# Patient Record
Sex: Female | Born: 1995 | Race: White | Hispanic: No | Marital: Single | Smoking: Current every day smoker
Health system: Southern US, Community
[De-identification: ages and names within clinical notes are randomized; demographics above are authoritative.]

## PROBLEM LIST (undated history)

## (undated) ENCOUNTER — Emergency Department (HOSPITAL_COMMUNITY): Admission: EM | Source: Home / Self Care

## (undated) ENCOUNTER — Inpatient Hospital Stay (HOSPITAL_COMMUNITY): Payer: Self-pay

## (undated) DIAGNOSIS — Z141 Cystic fibrosis carrier: Secondary | ICD-10-CM

## (undated) DIAGNOSIS — L309 Dermatitis, unspecified: Secondary | ICD-10-CM

## (undated) DIAGNOSIS — N390 Urinary tract infection, site not specified: Secondary | ICD-10-CM

## (undated) DIAGNOSIS — N883 Incompetence of cervix uteri: Secondary | ICD-10-CM

## (undated) DIAGNOSIS — F329 Major depressive disorder, single episode, unspecified: Secondary | ICD-10-CM

## (undated) DIAGNOSIS — F32A Depression, unspecified: Secondary | ICD-10-CM

## (undated) DIAGNOSIS — F1910 Other psychoactive substance abuse, uncomplicated: Secondary | ICD-10-CM

## (undated) HISTORY — PX: WISDOM TOOTH EXTRACTION: SHX21

## (undated) HISTORY — PX: PLACEMENT OF BREAST IMPLANTS: SHX6334

## (undated) HISTORY — DX: Urinary tract infection, site not specified: N39.0

## (undated) HISTORY — DX: Dermatitis, unspecified: L30.9

## (undated) HISTORY — DX: Incompetence of cervix uteri: N88.3

## (undated) HISTORY — DX: Cystic fibrosis carrier: Z14.1

## (undated) HISTORY — PX: NO PAST SURGERIES: SHX2092

---
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
## 2013-10-09 ENCOUNTER — Emergency Department (HOSPITAL_COMMUNITY)
Admission: EM | Admit: 2013-10-09 | Discharge: 2013-10-09 | Disposition: A | Discharge status: Home / Self Care | Payer: Self-pay | Attending: Emergency Medicine | Admitting: Emergency Medicine

## 2013-10-09 ENCOUNTER — Emergency Department (HOSPITAL_COMMUNITY): Payer: Self-pay

## 2013-10-09 ENCOUNTER — Encounter (HOSPITAL_COMMUNITY): Payer: Self-pay | Admitting: Emergency Medicine

## 2013-10-09 DIAGNOSIS — S60212A Contusion of left wrist, initial encounter: Secondary | ICD-10-CM

## 2013-10-09 DIAGNOSIS — Z87891 Personal history of nicotine dependence: Secondary | ICD-10-CM | POA: Insufficient documentation

## 2013-10-09 DIAGNOSIS — X500XXA Overexertion from strenuous movement or load, initial encounter: Secondary | ICD-10-CM | POA: Insufficient documentation

## 2013-10-09 DIAGNOSIS — Z872 Personal history of diseases of the skin and subcutaneous tissue: Secondary | ICD-10-CM | POA: Insufficient documentation

## 2013-10-09 DIAGNOSIS — Z8742 Personal history of other diseases of the female genital tract: Secondary | ICD-10-CM | POA: Insufficient documentation

## 2013-10-09 DIAGNOSIS — Z8659 Personal history of other mental and behavioral disorders: Secondary | ICD-10-CM | POA: Insufficient documentation

## 2013-10-09 DIAGNOSIS — S60219A Contusion of unspecified wrist, initial encounter: Secondary | ICD-10-CM | POA: Insufficient documentation

## 2013-10-09 DIAGNOSIS — Y9323 Activity, snow (alpine) (downhill) skiing, snow boarding, sledding, tobogganing and snow tubing: Secondary | ICD-10-CM | POA: Insufficient documentation

## 2013-10-09 DIAGNOSIS — Y9289 Other specified places as the place of occurrence of the external cause: Secondary | ICD-10-CM | POA: Insufficient documentation

## 2013-10-09 MED ORDER — TRAMADOL HCL 50 MG PO TABS: 50.0000 mg | ORAL_TABLET | Freq: Four times a day (QID) | ORAL | Status: DC | PRN

## 2013-10-09 NOTE — ED Notes (Signed)
Pt reports falling yesterday and having pain to left wrist and hand.

## 2013-10-09 NOTE — ED Provider Notes (Signed)
CSN: [id number]     Arrival date & time 10/09/13  1008 History  This chart was scribed for non-physician practitioner working with Ward GivensIva L Knapp, MD by Ashley JacobsBrittany Andrews, ED scribe. This patient was seen in room TR09C/TR09C and the patient's care was started at 10:29 AM.   First MD Initiated Contact with Patient 10/09/13 1026     Chief Complaint  Patient presents with  . Fall  . Wrist Pain   (Consider location/radiation/quality/duration/timing/severity/associated sxs/prior Treatment) Patient is a 18 y.o. female presenting with fall and wrist pain. The history is provided by the patient and medical records. No language interpreter was used.  Fall Pertinent negatives include no chest pain, no abdominal pain and no headaches.  Wrist Pain Pertinent negatives include no chest pain, no abdominal pain and no headaches.   HPI Comments: Nicole Morgan is a 18 y.o. female who presents to the Emergency Department complaining of fall that occurred yesterday. She went skiing yesterday and fell. After trying to get up pt reports hearing a "pop" and immediately felt pain medially. Pt is experiencing constant, moderate, left wrist and pain today that is worse with movement. She has a past medical hx of cystic fibrosis. She does not have any known allergies to medications. She denies any other pain. Nothing seems to resolve the pain.   Past Medical History  Diagnosis Date  . Depression     no meds  . Incompetent cervix   . Cystic fibrosis carrier   . Eczema    Past Surgical History  Procedure Laterality Date  . Wisdom tooth extraction    . Cervical cerclage  06/06/2012    Procedure: CERCLAGE CERVICAL;  Surgeon: Bing Plumehomas F Henley, MD;  Location: [facility];  Service: Gynecology;  Laterality: N/A;  . No past surgeries     Family History  Problem Relation Age of Onset  . Bipolar disorder Mother   . Mental illness Mother     schizophrenia  . Deep vein thrombosis Maternal Aunt   . Alcohol abuse Maternal  Grandmother   . Mental illness Maternal Grandmother    History  Substance Use Topics  . Smoking status: Former Smoker -- 0.25 packs/day for 1 years    Types: Cigarettes    Quit date: 02/22/2012  . Smokeless tobacco: Never Used  . Alcohol Use: No   OB History   Grav Para Term Preterm Abortions TAB SAB Ect Mult Living   1 1 1  0 0 0 0 0 0 1     Review of Systems  Cardiovascular: Negative for chest pain.  Gastrointestinal: Negative for abdominal pain.  Musculoskeletal: Positive for arthralgias and myalgias. Negative for back pain, gait problem and neck pain.  Neurological: Negative for headaches.  All other systems reviewed and are negative.    Allergies  Review of patient's allergies indicates no known allergies.  Home Medications   Current Outpatient Rx  Name  Route  Sig  Dispense  Refill  . calcium carbonate (TUMS - DOSED IN MG ELEMENTAL CALCIUM) 500 MG chewable tablet   Oral   Chew 2 tablets by mouth at bedtime as needed.         Marland Kitchen. ibuprofen (ADVIL,MOTRIN) 600 MG tablet   Oral   Take 1 tablet (600 mg total) by mouth every 6 (six) hours.   30 tablet   1    BP 106/73  Pulse 91  Temp(Src) 98.3 F (36.8 C) (Oral)  Resp 16  Ht 5\' 1"  (1.549 m)  Wt  126 lb (57.153 kg)  BMI 23.82 kg/m2  SpO2 100% Physical Exam  Nursing note and vitals reviewed. Constitutional: She is oriented to person, place, and time. She appears well-developed and well-nourished. No distress.  HENT:  Head: Normocephalic and atraumatic.  Eyes: EOM are normal. Pupils are equal, round, and reactive to light.  Neck: Normal range of motion. Neck supple. No tracheal deviation present.  Cardiovascular: Normal rate.   Pulmonary/Chest: Effort normal. No respiratory distress.  Abdominal: Soft. She exhibits no distension.  Musculoskeletal:       Left wrist: She exhibits tenderness and bony tenderness. She exhibits normal range of motion, no swelling, no crepitus and no deformity.       Left hand: She  exhibits tenderness and bony tenderness. She exhibits normal range of motion, normal capillary refill and no deformity. Normal sensation noted. Normal strength noted.       Hands: Neurological: She is alert and oriented to person, place, and time.  Skin: Skin is warm and dry.  Psychiatric: She has a normal mood and affect. Her behavior is normal.    ED Course  Procedures (including critical care time) DIAGNOSTIC STUDIES: Oxygen Saturation is 100% on room air, normal by my interpretation.    COORDINATION OF CARE:  10:40 AM Discussed course of care with pt which includes left wrist and hand x-ray. Pt understands and agrees.    Labs Review Labs Reviewed - No data to display Imaging Review Dg Wrist Complete Left  10/09/2013   CLINICAL DATA:  Lateral posterior wrist pain and swelling, patient fell yesterday  EXAM: LEFT WRIST - COMPLETE 3+ VIEW  COMPARISON:  None.  FINDINGS: There is no evidence of fracture or dislocation. There is no evidence of arthropathy or other focal bone abnormality. Soft tissues are unremarkable.  IMPRESSION: Negative.   Electronically Signed   By: Esperanza Heir M.D.   On: 10/09/2013 11:17   Dg Hand Complete Left  10/09/2013   CLINICAL DATA:  Larey Seat yesterday with lateral posterior wrist pain  EXAM: LEFT HAND - COMPLETE 3+ VIEW  COMPARISON:  None.  FINDINGS: There is no evidence of fracture or dislocation. There is no evidence of arthropathy or other focal bone abnormality. Soft tissues are unremarkable.  IMPRESSION: Negative.   Electronically Signed   By: Esperanza Heir M.D.   On: 10/09/2013 11:18    EKG Interpretation   None       MDM  Left wrist contusion  Patient here with left wrist pain after falling while skiing yesterday - no obvious sign of fracture on x-ray.  Placed in left wrist splint.  Will have the patient follow up with Dr. Amanda Pea if she is continuing to have pain in case of occult fracture.  Short course of pain medication.  I personally  performed the services described in this documentation, which was scribed in my presence. The recorded information has been reviewed and is accurate.      Izola Price Marisue Humble, PA-C 10/09/13 1125

## 2013-10-09 NOTE — ED Provider Notes (Signed)
Medical screening examination/treatment/procedure(s) were performed by non-physician practitioner and as supervising physician I was immediately available for consultation/collaboration.  EKG Interpretation   None       Devoria AlbeIva Osualdo Hansell, MD, Armando GangFACEP   Ward GivensIva L Shanesha Bednarz, MD 10/09/13 [phone]

## 2013-10-09 NOTE — Discharge Instructions (Signed)

## 2013-11-21 ENCOUNTER — Emergency Department (HOSPITAL_COMMUNITY)
Admission: EM | Admit: 2013-11-21 | Discharge: 2013-11-21 | Disposition: A | Discharge status: Home / Self Care | Payer: Medicaid Other | Attending: Emergency Medicine | Admitting: Emergency Medicine

## 2013-11-21 ENCOUNTER — Encounter (HOSPITAL_COMMUNITY): Payer: Self-pay | Admitting: Emergency Medicine

## 2013-11-21 DIAGNOSIS — R110 Nausea: Secondary | ICD-10-CM | POA: Insufficient documentation

## 2013-11-21 DIAGNOSIS — IMO0002 Reserved for concepts with insufficient information to code with codable children: Secondary | ICD-10-CM | POA: Insufficient documentation

## 2013-11-21 DIAGNOSIS — F3289 Other specified depressive episodes: Secondary | ICD-10-CM | POA: Insufficient documentation

## 2013-11-21 DIAGNOSIS — N949 Unspecified condition associated with female genital organs and menstrual cycle: Secondary | ICD-10-CM | POA: Insufficient documentation

## 2013-11-21 DIAGNOSIS — R102 Pelvic and perineal pain: Secondary | ICD-10-CM

## 2013-11-21 DIAGNOSIS — F329 Major depressive disorder, single episode, unspecified: Secondary | ICD-10-CM | POA: Insufficient documentation

## 2013-11-21 DIAGNOSIS — Z87891 Personal history of nicotine dependence: Secondary | ICD-10-CM | POA: Insufficient documentation

## 2013-11-21 DIAGNOSIS — Z9889 Other specified postprocedural states: Secondary | ICD-10-CM | POA: Insufficient documentation

## 2013-11-21 DIAGNOSIS — Z872 Personal history of diseases of the skin and subcutaneous tissue: Secondary | ICD-10-CM | POA: Insufficient documentation

## 2013-11-21 DIAGNOSIS — Z3202 Encounter for pregnancy test, result negative: Secondary | ICD-10-CM | POA: Insufficient documentation

## 2013-11-21 LAB — URINALYSIS, ROUTINE W REFLEX MICROSCOPIC
Bilirubin Urine: NEGATIVE
Glucose, UA: NEGATIVE mg/dL
Hgb urine dipstick: NEGATIVE
KETONES UR: NEGATIVE mg/dL
LEUKOCYTES UA: NEGATIVE
NITRITE: NEGATIVE
PH: 7.5 (ref 5.0–8.0)
PROTEIN: NEGATIVE mg/dL
Specific Gravity, Urine: 1.017 (ref 1.005–1.030)
Urobilinogen, UA: 0.2 mg/dL (ref 0.0–1.0)

## 2013-11-21 LAB — WET PREP, GENITAL
Clue Cells Wet Prep HPF POC: NONE SEEN
Trich, Wet Prep: NONE SEEN
Yeast Wet Prep HPF POC: NONE SEEN

## 2013-11-21 LAB — POC URINE PREG, ED: Preg Test, Ur: NEGATIVE

## 2013-11-21 MED ORDER — NAPROXEN 500 MG PO TABS: 500.0000 mg | ORAL_TABLET | Freq: Two times a day (BID) | ORAL | Status: DC

## 2013-11-21 NOTE — ED Notes (Signed)
Pt reporting pain to her IUD since yesterday. Denies vaginal bleeding or discharge. States lower abdominal pain cramping around where her IUD would be when she has sex. States just wants to make sure her IUD is okay. Pt is a x 4

## 2013-11-21 NOTE — Discharge Instructions (Signed)
Dyspareunia Dyspareunia is pain during sexual intercourse. It is most common in women, but it also happens in men.  CAUSES  Female The pain from this condition is usually felt when anything is put into the vagina, but any part of the genitals may cause pain during sex. Even sitting or wearing pants can cause pain. Sometimes, a cause cannot be found. Some causes of pain during intercourse are:  Infections of the skin around the vagina.  Vaginal infections, such as a yeast, bacterial, or viral infection.  Vaginismus. This is the inability to have anything put in the vagina even when the woman wants it to happen. There is an automatic muscle contraction and pain. The pain of the muscle contraction can be so severe that intercourse is impossible.  Allergic reaction from spermicides, semen, condoms, scented tampons, soaps, douches, and vaginal sprays.  A fluid-filled sac (cyst) on the Bartholin or Skene glands, located at the opening of the vagina.  Scar tissue in the vagina from a surgically enlarged opening (episiotomy) or tearing after delivering a baby.  Vaginal dryness. This is more common in menopause. The normal secretions of the vagina are decreased. Changes in estrogen levels and increased difficulty becoming aroused can cause painful sex. Vaginal dryness can also happen when taking birth control pills.  Thinning of the tissue (atrophy) of the vulva and vagina. This makes the area thinner, smaller, unable to stretch to accommodate a penis, and prone to infection and tearing.  Vulvar vestibulitis or vestibulodynia.This is a condition that causes pain involving the area around the entrance to the vagina.The most common cause in young women is birth control pills.Women with low estrogen levels (postmenopausal women) may also experience this.Other causes include allergic reactions, too many nerve endings, skin conditions, and pelvic muscles that cannot relax.  Vulvar dermatoses. This  includes skin conditions such as lichen sclerosus and lichen planus.  Lack of foreplay to lubricate the vagina. This can cause vaginal dryness.  Noncancerous tumors (fibroids) in the uterus.  Uterus lining tissue growing outside the uterus (endometriosis).  Pregnancy that starts in the fallopian tube (tubal pregnancy).  Pregnancy or breastfeeding your baby. This can cause vaginal dryness.  A tilting or prolapse of the uterus. Prolapse is when weak and stretched muscles around the uterus allow it to fall into the vagina.  Problems with the ovaries, cysts, or scar tissue. This may be worse with certain sexual positions.  Previous surgeries causing adhesions or scar tissue in the vagina or pelvis.  Bladder and intestinal problems.  Psychological problems (such as depression or anxiety). This may make pain worse.  Negative attitudes about sex, experiencing rape, sexual assault, and misinformation about sex. These issues are often related to some types of pain.  Previous pelvic infection, causing scar tissue in the pelvis and on the female organs.  Cyst or tumor on the ovary.  Cancer of the female organs.  Certain medicines.  Medical problems such as diabetes, arthritis, or thyroid disease. Female In men, there are many physical causes of sexual discomfort. Some causes of pain during intercourse are:  Infections of the prostate, bladder, or seminal vesicles. This can cause pain after ejaculation.  An inflamed bladder (interstitial cystitis). This may cause pain from ejaculation.  Gonorrheal infections. This may cause pain during ejaculation.  An inflamed urethra (urethritis) or inflamed prostate (prostatitis). This can make genital stimulation painful or uncomfortable.  Deformities of the penis, such as Peyronie's disease.  A tight foreskin.  Cancer of the female organs.    Psychological problems. This may make pain worse. DIAGNOSIS   Your caregiver will take a history and  have you describe where the pain is located (outside the vagina, in the vagina, in the pelvis). You may be asked when you experience pain, such as with penetration or with thrusting.  Following this, your caregiver will do a physical exam. Let your caregiver know if the exam is too painful.  During the final part of the female exam, your caregiver will feel your uterus and ovaries with one hand on the abdomen and one finger in your vagina. This is a pelvic exam.  Blood tests, a Pap test, cultures for infection, an ultrasound test, and X-rays may be done. You may need to see a specialist for female problems (gynecologist).  Your caregiver may do a CT scan, MRI, or laparoscopy. Laparoscopy is a procedure to look into the pelvis with a lighted tube, through a cut (incision) in the abdomen. TREATMENT  Your caregiver can help you determine the best course of treatment. Sometimes, more testing is done. Continue with the suggested testing until your caregiver feels sure about your diagnosis and how to treat it. Sometimes, it is difficult to find the reason for the pain. The search for the cause and treatment can be frustrating. Treatment often takes several weeks to a few months before you notice any improvement. You may also need to avoid sexual activity until symptoms improve.Continuing to have sex when it hurts can delay healing and actually make the problem worse. The treatment depends on the cause of the pain. Treatment may include:  Medicines such as antibiotics, vaginal or skin creams, hormones, or antidepressants.  Minor or major surgery.  Psychological counseling or group therapy.  Kegel exercises and vaginal dilators to help certain cases of vaginismus (spasms). Do this only if recommended by your caregiver.Kegel exercises can make some problems worse.  Applying lubrication as recommended by your caregiver if you have dryness.  Sex therapy for you and your sex partner. It is common for  the pain to continue after the reason for the pain has been treated. Some reasons for this include a conditioned response. This means the person having the pain becomes so familiar with the pain that the pain continues as a response, even though the cause is removed. Sex therapy can help with this problem. HOME CARE INSTRUCTIONS   Follow your caregiver's instructions about taking medicines, tests, counseling, and follow-up treatment.  Do not use scented tampons, douches, vaginal sprays, or soaps.  Use water-based lubricants for dryness. Oil lubricants can cause irritation.  Do not use spermicides or condoms that irritate you.  Openly discuss with your partner your sexual experience, your desires, foreplay, and different sexual positions for a more comfortable and enjoyable sexual relationship.  Join group sessions for therapy, if needed.  Practice safe sex at all times.  Empty your bladder before having intercourse.  Try different positions during sexual intercourse.  Take over-the-counter pain medicine recommended by your caregiver before having sexual intercourse.  Do not wear pantyhose. Knee-high and thigh-high hose are okay.  Avoid scrubbing your vulva with a washcloth. Wash the area gently and pat dry with a towel. SEEK MEDICAL CARE IF:   You develop vaginal bleeding after sexual intercourse.  You develop a lump at the opening of your vagina, even if it is not painful.  You have abnormal vaginal discharge.  You have vaginal dryness.  You have itching or irritation of the vulva or vagina.  You   develop a rash or reaction to your medicine. SEEK IMMEDIATE MEDICAL CARE IF:   You develop severe abdominal pain during or shortly after sexual intercourse. You could have a ruptured ovarian cyst or ruptured tubal pregnancy.  You have a fever.  You have painful or bloody urination.  You have painful sexual intercourse, and you never had it before.  You pass out after having  sexual intercourse. Document Released: 09/29/2007 Document Revised: 12/02/2011 Document Reviewed: 12/10/2010 [employer] Patient Information 2014 [employer].  

## 2013-11-21 NOTE — ED Provider Notes (Signed)
CSN: [id number]     Arrival date & time 11/21/13  1432 History   First MD Initiated Contact with Patient 11/21/13 1628     Chief Complaint  Patient presents with  . IUD Pain    HPI Comments: 18 yo F hx of incompetent cervix with pregnancy s/p cerclage, eczema, depression, presents with CC of abdominal pain.  Pain started a few months ago, and has been sporadic, with return yesterday.  Pt c/o of suprapubic cramping, nonradiating.  She had some associated nausea with severe pain. In past she has had dyspareunia as well.  Denies fever, chills, CP, SOB, vomiting, diarrhea, constipation, vaginal discharge, vaginal bleeding, myalgias, rash, or any other symptoms.  Pt has IUD, and thought pain may be 2/2 IUD.  She attempted to go see her OB/Gyn but was unable to get in 2/2 no insurance.  She is currently sexually active, and denies possibility of STI.    The history is provided by the patient. No language interpreter was used.    Past Medical History  Diagnosis Date  . Depression     no meds  . Incompetent cervix   . Cystic fibrosis carrier   . Eczema    Past Surgical History  Procedure Laterality Date  . Wisdom tooth extraction    . Cervical cerclage  06/06/2012    Procedure: CERCLAGE CERVICAL;  Surgeon: Bing Plumehomas F Henley, MD;  Location: [facility];  Service: Gynecology;  Laterality: N/A;  . No past surgeries     Family History  Problem Relation Age of Onset  . Bipolar disorder Mother   . Mental illness Mother     schizophrenia  . Deep vein thrombosis Maternal Aunt   . Alcohol abuse Maternal Grandmother   . Mental illness Maternal Grandmother    History  Substance Use Topics  . Smoking status: Former Smoker -- 0.25 packs/day for 1 years    Types: Cigarettes    Quit date: 02/22/2012  . Smokeless tobacco: Never Used  . Alcohol Use: No   OB History   Grav Para Term Preterm Abortions TAB SAB Ect Mult Living   1 1 1  0 0 0 0 0 0 1     Review of Systems  Constitutional: Negative for fever  and chills.  Respiratory: Negative for cough and shortness of breath.   Cardiovascular: Negative for chest pain.  Gastrointestinal: Positive for nausea and abdominal pain. Negative for vomiting and diarrhea.  Genitourinary: Positive for dyspareunia. Negative for dysuria, hematuria, vaginal bleeding, vaginal discharge and menstrual problem.  Musculoskeletal: Negative for myalgias.  Skin: Negative for rash.  Neurological: Negative for dizziness, weakness, light-headedness, numbness and headaches.  Hematological: Negative for adenopathy. Does not bruise/bleed easily.  All other systems reviewed and are negative.      Allergies  Review of patient's allergies indicates no known allergies.  Home Medications   Current Outpatient Rx  Name  Route  Sig  Dispense  Refill  . traMADol (ULTRAM) 50 MG tablet   Oral   Take 1 tablet (50 mg total) by mouth every 6 (six) hours as needed.   15 tablet   0    BP 102/60  Pulse 78  Temp(Src) 98.7 F (37.1 C) (Oral)  Resp 18  SpO2 100%  LMP 11/07/2013 Physical Exam  Nursing note and vitals reviewed. Constitutional: She is oriented to person, place, and time. She appears well-developed and well-nourished.  HENT:  Head: Normocephalic and atraumatic.  Right Ear: External ear normal.  Left Ear: External  ear normal.  Nose: Nose normal.  Mouth/Throat: Oropharynx is clear and moist.  Eyes: Conjunctivae and EOM are normal. Pupils are equal, round, and reactive to light.  Neck: Normal range of motion. Neck supple.  Cardiovascular: Normal rate, regular rhythm, normal heart sounds and intact distal pulses.   Pulmonary/Chest: Effort normal and breath sounds normal. No respiratory distress. She has no wheezes. She has no rales. She exhibits no tenderness.  Abdominal: Soft. Bowel sounds are normal. She exhibits no distension and no mass. There is tenderness. There is no rebound and no guarding.  Soft, mildly TTP of bilateral pelvis, suprapubic.  No  guarding, no rebound, no distention, nonperitonitic.   Musculoskeletal: Normal range of motion.  Neurological: She is alert and oriented to person, place, and time.  Skin: Skin is warm and dry.    ED Course  Procedures (including critical care time) Labs Review Labs Reviewed  WET PREP, GENITAL - Abnormal; Notable for the following:    WBC, Wet Prep HPF POC FEW (*)    All other components within normal limits  GC/CHLAMYDIA PROBE AMP  URINALYSIS, ROUTINE W REFLEX MICROSCOPIC  POC URINE PREG, ED   Imaging Review No results found.   EKG Interpretation None      MDM   Final diagnoses:  None   18 yo F hx of incompetent cervix with pregnancy s/p cerclage, eczema, depression, presents with CC of abdominal pain  Filed Vitals:   11/21/13 1508  BP: 102/60  Pulse: 78  Temp: 98.7 F (37.1 C)  Resp: 18   Physical exam as above.  VS WNL.  Pt looks well, no distress.  Abdomen soft, mild TTP bilateral pelvis, suprapubic area.  Nonperitonitic.  Transabdominal pelvic US performed, which shows good positioning of IUD, no free fluid.  Urine pregnancy test is negative.  Urine negative for infection.  Pelvic exam no vaginal or cervical discharge or bleeding, no adnexal TTP or mass, no CMT. Wet prep no clue cells, trich, or yeast.  Diagnosis of pelvic pain, dyspareunia.  Unlikely cervicitis, PID, ovarian torsion, or IUD migration.  Question of pelvic floor dysfunction.  Pt to be d/c home in good condition.  Encouraged to continue supportive care.  Rx for Naproxen 500 mg BID.  F/u with PCP in 1 week, OB/Gyn when able.  Return precautions given.  Pt understands and agrees with plan.  I have discussed pt's care plan with Dr. Jodi Mourning.  Jon Gills, MD      Jon Gills, MD 11/22/13 [place]

## 2013-11-22 LAB — GC/CHLAMYDIA PROBE AMP
CT Probe RNA: NEGATIVE
GC PROBE AMP APTIMA: NEGATIVE

## 2013-11-22 NOTE — ED Provider Notes (Signed)
  Medical screening examination/treatment/procedure(s) were conducted as a shared visit with non-physician practitioner(s) or resident and myself. I personally evaluated the patient during the encounter and agree with the findings and plan unless otherwise indicated.  I have personally reviewed any xrays and/ or EKG's with the provider and I agree with interpretation.  Intermittent suprapubic pain with cramping the past 1-2 months. No vaginal sxs. No fever or vomiting. Unable to see OB due to no insurance. Exam well appearing, minimal suprapubic tenderness, no guarding, mmm, RRR.  Plan for UA, pelvic and close outpt fup. No indication for imaging at this time as pretest prob very low for emergent issues ie torsion or ectopic or abscess.   Labs Reviewed   WET PREP, GENITAL   GC/CHLAMYDIA PROBE AMP   URINALYSIS, ROUTINE W REFLEX MICROSCOPIC   POC URINE PREG, ED    Abdominal pain        Enid SkeensJoshua M Anton Cheramie, MD 11/22/13 [phone]

## 2014-04-17 ENCOUNTER — Emergency Department (HOSPITAL_COMMUNITY)
Admission: EM | Admit: 2014-04-17 | Discharge: 2014-04-18 | Disposition: A | Discharge status: Home / Self Care | Payer: Medicaid Other | Attending: Emergency Medicine | Admitting: Emergency Medicine

## 2014-04-17 ENCOUNTER — Encounter (HOSPITAL_COMMUNITY): Payer: Self-pay | Admitting: Emergency Medicine

## 2014-04-17 DIAGNOSIS — B9689 Other specified bacterial agents as the cause of diseases classified elsewhere: Secondary | ICD-10-CM | POA: Insufficient documentation

## 2014-04-17 DIAGNOSIS — A499 Bacterial infection, unspecified: Secondary | ICD-10-CM | POA: Insufficient documentation

## 2014-04-17 DIAGNOSIS — N390 Urinary tract infection, site not specified: Secondary | ICD-10-CM | POA: Insufficient documentation

## 2014-04-17 DIAGNOSIS — Z87891 Personal history of nicotine dependence: Secondary | ICD-10-CM | POA: Insufficient documentation

## 2014-04-17 DIAGNOSIS — Z3202 Encounter for pregnancy test, result negative: Secondary | ICD-10-CM | POA: Insufficient documentation

## 2014-04-17 DIAGNOSIS — T7421XA Adult sexual abuse, confirmed, initial encounter: Secondary | ICD-10-CM | POA: Insufficient documentation

## 2014-04-17 DIAGNOSIS — Z872 Personal history of diseases of the skin and subcutaneous tissue: Secondary | ICD-10-CM | POA: Insufficient documentation

## 2014-04-17 DIAGNOSIS — N760 Acute vaginitis: Secondary | ICD-10-CM | POA: Insufficient documentation

## 2014-04-17 DIAGNOSIS — R319 Hematuria, unspecified: Secondary | ICD-10-CM | POA: Insufficient documentation

## 2014-04-17 DIAGNOSIS — Z8659 Personal history of other mental and behavioral disorders: Secondary | ICD-10-CM | POA: Insufficient documentation

## 2014-04-17 LAB — POC URINE PREG, ED: Preg Test, Ur: NEGATIVE

## 2014-04-17 MED ORDER — AZITHROMYCIN 250 MG PO TABS
1000.0000 mg | ORAL_TABLET | Freq: Once | ORAL | Status: AC
  Administered 2014-04-18: 1000 mg via ORAL
  Filled 2014-04-17: qty 4

## 2014-04-17 MED ORDER — CEFTRIAXONE SODIUM 250 MG IJ SOLR
250.0000 mg | Freq: Once | INTRAMUSCULAR | Status: AC
  Administered 2014-04-18: 250 mg via INTRAMUSCULAR
  Filled 2014-04-17: qty 250
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
## 2014-04-17 NOTE — ED Notes (Signed)
Renee, SANE RN reports she will be here to talk with patient in about 30 minutes.

## 2014-04-17 NOTE — ED Notes (Signed)
Pt reports she would like to talk with the SANE nurse.

## 2014-04-18 LAB — URINE MICROSCOPIC-ADD ON

## 2014-04-18 LAB — URINALYSIS, ROUTINE W REFLEX MICROSCOPIC
GLUCOSE, UA: NEGATIVE mg/dL
Ketones, ur: 15 mg/dL — AB
Nitrite: NEGATIVE
PROTEIN: NEGATIVE mg/dL
SPECIFIC GRAVITY, URINE: 1.026 (ref 1.005–1.030)
UROBILINOGEN UA: 1 mg/dL (ref 0.0–1.0)
pH: 5.5 (ref 5.0–8.0)

## 2014-04-18 LAB — WET PREP, GENITAL: Trich, Wet Prep: NONE SEEN

## 2014-04-18 LAB — RPR

## 2014-04-18 LAB — HIV ANTIBODY (ROUTINE TESTING W REFLEX): HIV 1&2 Ab, 4th Generation: NONREACTIVE

## 2014-04-18 MED ORDER — LIDOCAINE HCL (PF) 1 % IJ SOLN
0.9000 mL | Freq: Once | INTRAMUSCULAR | Status: AC
  Administered 2014-04-18: 0.9 mL via SUBCUTANEOUS

## 2014-04-18 MED ORDER — CEPHALEXIN 500 MG PO CAPS: 500.0000 mg | ORAL_CAPSULE | Freq: Four times a day (QID) | ORAL | Status: DC

## 2014-04-18 MED ORDER — METRONIDAZOLE 500 MG PO TABS: 500.0000 mg | ORAL_TABLET | Freq: Two times a day (BID) | ORAL | Status: DC

## 2014-04-18 MED ORDER — LIDOCAINE HCL (PF) 1 % IJ SOLN
INTRAMUSCULAR | Status: AC
  Administered 2014-04-18: 0.9 mL via SUBCUTANEOUS
  Filled 2014-04-18: qty 5
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
## 2014-04-18 NOTE — ED Notes (Signed)
Pt A&Ox4, ambulatory at d/c with steady gait, NAD, declined wheelchair. 

## 2014-04-19 LAB — GC/CHLAMYDIA PROBE AMP
CT PROBE, AMP APTIMA: NEGATIVE
GC PROBE AMP APTIMA: NEGATIVE
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
## 2014-07-25 ENCOUNTER — Encounter (HOSPITAL_COMMUNITY): Payer: Self-pay | Admitting: Emergency Medicine

## 2016-01-14 ENCOUNTER — Emergency Department (HOSPITAL_COMMUNITY)
Admission: EM | Admit: 2016-01-14 | Discharge: 2016-01-14 | Disposition: A | Discharge status: Home / Self Care | Payer: Medicaid Other | Attending: Emergency Medicine | Admitting: Emergency Medicine

## 2016-01-14 ENCOUNTER — Encounter (HOSPITAL_COMMUNITY): Payer: Self-pay | Admitting: Nurse Practitioner

## 2016-01-14 DIAGNOSIS — Z87891 Personal history of nicotine dependence: Secondary | ICD-10-CM | POA: Insufficient documentation

## 2016-01-14 DIAGNOSIS — Z792 Long term (current) use of antibiotics: Secondary | ICD-10-CM | POA: Insufficient documentation

## 2016-01-14 DIAGNOSIS — S4991XA Unspecified injury of right shoulder and upper arm, initial encounter: Secondary | ICD-10-CM | POA: Insufficient documentation

## 2016-01-14 DIAGNOSIS — S39012A Strain of muscle, fascia and tendon of lower back, initial encounter: Secondary | ICD-10-CM

## 2016-01-14 DIAGNOSIS — Y9389 Activity, other specified: Secondary | ICD-10-CM | POA: Insufficient documentation

## 2016-01-14 DIAGNOSIS — Z8659 Personal history of other mental and behavioral disorders: Secondary | ICD-10-CM | POA: Insufficient documentation

## 2016-01-14 DIAGNOSIS — Z872 Personal history of diseases of the skin and subcutaneous tissue: Secondary | ICD-10-CM | POA: Insufficient documentation

## 2016-01-14 DIAGNOSIS — Z141 Cystic fibrosis carrier: Secondary | ICD-10-CM | POA: Insufficient documentation

## 2016-01-14 DIAGNOSIS — Y9289 Other specified places as the place of occurrence of the external cause: Secondary | ICD-10-CM | POA: Insufficient documentation

## 2016-01-14 DIAGNOSIS — Y998 Other external cause status: Secondary | ICD-10-CM | POA: Insufficient documentation

## 2016-01-14 DIAGNOSIS — X500XXA Overexertion from strenuous movement or load, initial encounter: Secondary | ICD-10-CM | POA: Insufficient documentation

## 2016-01-14 MED ORDER — CYCLOBENZAPRINE HCL 10 MG PO TABS: ORAL_TABLET | ORAL | Status: DC

## 2016-01-14 MED ORDER — DICLOFENAC SODIUM 50 MG PO TBEC: 50.0000 mg | DELAYED_RELEASE_TABLET | Freq: Two times a day (BID) | ORAL | Status: DC
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
## 2017-01-01 ENCOUNTER — Emergency Department (HOSPITAL_COMMUNITY): Payer: Medicaid Other

## 2017-01-01 ENCOUNTER — Emergency Department (HOSPITAL_COMMUNITY)
Admission: EM | Admit: 2017-01-01 | Discharge: 2017-01-01 | Disposition: A | Discharge status: Home / Self Care | Payer: Medicaid Other | Attending: Emergency Medicine | Admitting: Emergency Medicine

## 2017-01-01 ENCOUNTER — Encounter (HOSPITAL_COMMUNITY): Payer: Self-pay

## 2017-01-01 DIAGNOSIS — R0789 Other chest pain: Secondary | ICD-10-CM | POA: Insufficient documentation

## 2017-01-01 DIAGNOSIS — Z79899 Other long term (current) drug therapy: Secondary | ICD-10-CM | POA: Insufficient documentation

## 2017-01-01 DIAGNOSIS — Z87891 Personal history of nicotine dependence: Secondary | ICD-10-CM | POA: Insufficient documentation

## 2017-01-01 MED ORDER — IBUPROFEN 600 MG PO TABS: 600.0000 mg | ORAL_TABLET | Freq: Four times a day (QID) | ORAL | 0 refills | Status: DC | PRN

## 2017-01-01 MED ORDER — IBUPROFEN 800 MG PO TABS
800.0000 mg | ORAL_TABLET | Freq: Once | ORAL | Status: AC
  Administered 2017-01-01: 800 mg via ORAL
  Filled 2017-01-01: qty 1

## 2017-01-01 NOTE — ED Triage Notes (Signed)
Pt presents to the ed with complaints of pain in the left side of her ribs. She cracked her ribs once before and today she coughed really hard and felt like they cracked again, patient is in no distress in triage, no shortness of breath, lung sounds equal on each side. Complaints of a productive cough for a month now.

## 2017-01-01 NOTE — ED Provider Notes (Signed)
[facility]-EMERGENCY DEPT Provider Note    By signing my name below, I, Earmon Phoenix, attest that this documentation has been prepared under the direction and in the presence of [facility], PA-C. Electronically Signed: Earmon Phoenix, ED Scribe. 01/01/17. 7:06 PM.     History   Chief Complaint Chief Complaint  Patient presents with  . Rib Injury   The history is provided by the patient and medical records. No language interpreter was used.    Nicole Morgan is a 21 y.o. female who presents to the Emergency Department complaining of sudden onset left anterior and posterior rib pain, extending from her back around to the front of the chest, that began about two hours ago. She states she was sitting on her couch and coughed when she felt a deep popping sensation in the ribs and experienced 10/10 pain. She states she has had an intermittent productive cough of mucous for one month but is now improving over the past week. She also reports some nasal congestion. She states she has had a decrease in appetite for the past week. She states her boyfriend has had similar symptoms in the past and had leftover antibiotic that she took that helped alleviate her cold symptoms. She took NyQuil and DayQuil that did not give her any relief. States her cough is significantly improved at this point. She has not taken anything for pain. Deep breathing increases her pain. Sitting at rest helps alleviate her pain to 5/10 which she states she is at right now. She denies numbness, tingling or weakness of the upper extremities, fever, chills, nausea, vomiting, abdominal pain, ear pain, sore throat, SOB, CP, hematochezia, dysuria, hematuria, hemoptysis, calf swelling or pain, LOC, gait problems. She is a current smoker of about 1/2 PPD. She reports having a Mirena IUD in place for contraception.   Past Medical History:  Diagnosis Date  . Cystic fibrosis carrier   . Depression    no meds  . Eczema   . Incompetent  cervix     There are no active problems to display for this patient.   Past Surgical History:  Procedure Laterality Date  . CERVICAL CERCLAGE  06/06/2012   Procedure: CERCLAGE CERVICAL;  Surgeon: Bing Plume, MD;  Location: [facility];  Service: Gynecology;  Laterality: N/A;  . NO PAST SURGERIES    . WISDOM TOOTH EXTRACTION      OB History    Gravida Para Term Preterm AB Living   0 0 1   SAB TAB Ectopic Multiple Live Births   0 0 0 0 1       Home Medications    Prior to Admission medications   Medication Sig Start Date End Date Taking? Authorizing Provider  cephALEXin (KEFLEX) 500 MG capsule Take 1 capsule (500 mg total) by mouth 4 (four) times daily. 04/18/14   Imagene Sheller, MD  cyclobenzaprine (FLEXERIL) 10 MG tablet Take 1/2 to 1 tablet PO BID as needed 01/14/16   Janne Napoleon, NP  diclofenac (VOLTAREN) 50 MG EC tablet Take 1 tablet (50 mg total) by mouth 2 (two) times daily. 01/14/16   Hope Orlene Och, NP  ibuprofen (ADVIL,MOTRIN) 600 MG tablet Take 1 tablet (600 mg total) by mouth every 6 (six) hours as needed. 01/01/17   Raeford Razor, MD  metroNIDAZOLE (FLAGYL) 500 MG tablet Take 1 tablet (500 mg total) by mouth 2 (two) times daily. 04/18/14   Imagene Sheller, MD    Family History Family History  Problem Relation Age of Onset  . Bipolar disorder Mother   . Mental illness Mother     schizophrenia  . Deep vein thrombosis Maternal Aunt   . Alcohol abuse Maternal Grandmother   . Mental illness Maternal Grandmother     Social History Social History  Substance Use Topics  . Smoking status: Former Smoker    Packs/day: 0.25    Years: 1.00    Types: Cigarettes    Quit date: 02/22/2012  . Smokeless tobacco: Never Used  . Alcohol use No     Allergies   Patient has no known allergies.   Review of Systems Review of Systems  Constitutional: Positive for appetite change. Negative for chills and fever.  HENT: Positive for congestion. Negative for ear pain, sinus pain,  sinus pressure and sore throat.   Respiratory: Positive for cough. Negative for shortness of breath.   Cardiovascular: Negative for chest pain.  Gastrointestinal: Negative for abdominal pain, blood in stool, diarrhea, nausea and vomiting.  Genitourinary: Negative for dysuria and hematuria.  Musculoskeletal: Positive for arthralgias and myalgias. Negative for gait problem.  Neurological: Negative for syncope and headaches.     Physical Exam Updated Vital Signs BP 104/75 (BP Location: Left Arm)   Pulse 63   Temp 97.8 F (36.6 C) (Oral)   Resp 16   LMP 12/01/2016   SpO2 100%   Physical Exam  Constitutional: She is oriented to person, place, and time. She appears well-developed and well-nourished.  HENT:  Head: Normocephalic and atraumatic.  Right Ear: External ear normal.  Left Ear: External ear normal.  Nose: Nose normal.  Mouth/Throat: Oropharynx is clear and moist.  No ttp of maxillary or frontal sinuses. Nose with pink mucosa and clear nasal discharge, septum midline, no septal hematoma.   Eyes: EOM are normal. Right eye exhibits no discharge. Left eye exhibits no discharge. No scleral icterus.  Neck: Neck supple. No JVD present. No tracheal deviation present.  Cardiovascular: Normal rate and intact distal pulses.   2+ radial and dp/pt pulses b/l, Negative Homan's  Pulmonary/Chest: Effort normal and breath sounds normal. She has no wheezes. She has no rales.  Breath sounds clear to auscultation bilaterally, equal rise and fall of chest, no deformity noted  Abdominal: Soft. She exhibits no distension and no mass. There is no tenderness. There is no guarding.  Musculoskeletal: Normal range of motion.  No midline spine tenderness. No flank pain. TTP along lower ribs on the left side, along the costal margin, to the anterior axillary line.   Neurological: She is alert and oriented to person, place, and time. No cranial nerve deficit.  Fluent speech, no facial droop, sensation  intact  Skin: Skin is warm and dry.  Psychiatric: She has a normal mood and affect. Her behavior is normal.  Nursing note and vitals reviewed.    ED Treatments / Results  COORDINATION OF CARE: 5:42 PM- Will order CXR. Pt verbalizes understanding and agrees to plan.  Medications  ibuprofen (ADVIL,MOTRIN) tablet 800 mg (800 mg Oral Given 01/01/17 1919)    Labs (all labs ordered are listed, but only abnormal results are displayed) Labs Reviewed - No data to display  EKG  EKG Interpretation None       Radiology Dg Chest 2 View  Result Date: 01/01/2017 CLINICAL DATA:  Productive cough for 1 month EXAM: CHEST  2 VIEW COMPARISON:  None. FINDINGS: Normal mediastinum and cardiac silhouette. Normal pulmonary vasculature. No evidence of effusion, infiltrate, or pneumothorax. No acute  bony abnormality. Mild scoliosis IMPRESSION: 1. Normal chest radiograph. 2. Mild scoliosis. Electronically Signed   By: Genevive Bi M.D.   On: 01/01/2017 18:27    Procedures Procedures (including critical care time)  Medications Ordered in ED Medications  ibuprofen (ADVIL,MOTRIN) tablet 800 mg (800 mg Oral Given 01/01/17 1919)     Initial Impression / Assessment and Plan / ED Course  I have reviewed the triage vital signs and the nursing notes.  Pertinent labs & imaging results that were available during my care of the patient were reviewed by me and considered in my medical decision making (see chart for details).     20yof presents to ED with chief complaint left sided poterior lower rib pain, which is reproducible on exam with palpation. Pt afebrile, VS at pt's baseline, SpO2 100% RA. Cough for 1 month that has improved. CXR with no acute abnormalities and no evidence of new rib fractures. Low suspicion PNE, PTX, atelectasis or DVT in absence of risk factors and with stable vital signs. Likely myofascial/musckuloskeletal in nature. Discussed symptomatic treatment including rest, ice, heat, and  NSAIDS/tylenol for pain. Pt will follow up with PCP in 3-4 days if pain does not improve. Discussed strict ED return precautions. Pt verbalized understanding of and agreement with plan and is stable for discharge home.   Final Clinical Impressions(s) / ED Diagnoses   Final diagnoses:  Left-sided chest wall pain    New Prescriptions  Discharge Medication List as of 01/01/2017  7:21 PM    START taking these medications   Details  !! ibuprofen (ADVIL,MOTRIN) 600 MG tablet Take 1 tablet (600 mg total) by mouth every 6 (six) hours as needed., Starting Wed 01/01/2017, Print    !! ibuprofen (ADVIL,MOTRIN) 600 MG tablet Take 1 tablet (600 mg total) by mouth every 6 (six) hours as needed., Starting Wed 01/01/2017, Until Mon 01/06/2017, Print     !! - Potential duplicate medications found. Please discuss with provider.     I personally performed the services described in this documentation, which was scribed in my presence. The recorded information has been reviewed and is accurate.     Jeanie Sewer, PA-C 01/01/17 2048    Raeford Razor, MD 01/04/17 [phone]

## 2017-02-23 ENCOUNTER — Emergency Department (HOSPITAL_COMMUNITY)
Admission: EM | Admit: 2017-02-23 | Discharge: 2017-02-23 | Disposition: A | Discharge status: Home / Self Care | Payer: No Typology Code available for payment source | Attending: Dermatology | Admitting: Dermatology

## 2017-02-23 ENCOUNTER — Encounter (HOSPITAL_COMMUNITY): Payer: Self-pay

## 2017-02-23 DIAGNOSIS — Y92410 Unspecified street and highway as the place of occurrence of the external cause: Secondary | ICD-10-CM | POA: Insufficient documentation

## 2017-02-23 DIAGNOSIS — M542 Cervicalgia: Secondary | ICD-10-CM | POA: Diagnosis not present

## 2017-02-23 DIAGNOSIS — Y999 Unspecified external cause status: Secondary | ICD-10-CM | POA: Insufficient documentation

## 2017-02-23 DIAGNOSIS — Y939 Activity, unspecified: Secondary | ICD-10-CM | POA: Insufficient documentation

## 2017-02-23 DIAGNOSIS — Z87891 Personal history of nicotine dependence: Secondary | ICD-10-CM | POA: Diagnosis not present

## 2017-02-23 DIAGNOSIS — Z5321 Procedure and treatment not carried out due to patient leaving prior to being seen by health care provider: Secondary | ICD-10-CM | POA: Insufficient documentation

## 2017-02-23 NOTE — ED Notes (Signed)
No answer when called x 2
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
## 2018-09-23 DIAGNOSIS — F319 Bipolar disorder, unspecified: Secondary | ICD-10-CM

## 2018-09-23 DIAGNOSIS — F4310 Post-traumatic stress disorder, unspecified: Secondary | ICD-10-CM

## 2018-09-23 DIAGNOSIS — G4700 Insomnia, unspecified: Secondary | ICD-10-CM

## 2018-09-23 DIAGNOSIS — F419 Anxiety disorder, unspecified: Secondary | ICD-10-CM

## 2018-09-23 HISTORY — DX: Insomnia, unspecified: G47.00

## 2018-09-23 HISTORY — DX: Anxiety disorder, unspecified: F41.9

## 2018-09-23 HISTORY — DX: Post-traumatic stress disorder, unspecified: F43.10

## 2018-09-23 HISTORY — DX: Bipolar disorder, unspecified: F31.9

## 2019-01-27 DIAGNOSIS — Z30432 Encounter for removal of intrauterine contraceptive device: Secondary | ICD-10-CM | POA: Diagnosis not present

## 2019-02-18 DIAGNOSIS — Z01419 Encounter for gynecological examination (general) (routine) without abnormal findings: Secondary | ICD-10-CM | POA: Diagnosis not present

## 2019-02-18 DIAGNOSIS — Z130 Encounter for screening for diseases of the blood and blood-forming organs and certain disorders involving the immune mechanism: Secondary | ICD-10-CM | POA: Diagnosis not present

## 2019-02-18 DIAGNOSIS — Z6824 Body mass index (BMI) 24.0-24.9, adult: Secondary | ICD-10-CM | POA: Diagnosis not present

## 2019-02-18 DIAGNOSIS — R87610 Atypical squamous cells of undetermined significance on cytologic smear of cervix (ASC-US): Secondary | ICD-10-CM | POA: Diagnosis not present

## 2019-02-18 DIAGNOSIS — Z113 Encounter for screening for infections with a predominantly sexual mode of transmission: Secondary | ICD-10-CM | POA: Diagnosis not present

## 2019-02-18 DIAGNOSIS — Z124 Encounter for screening for malignant neoplasm of cervix: Secondary | ICD-10-CM | POA: Diagnosis not present

## 2019-03-22 DIAGNOSIS — L700 Acne vulgaris: Secondary | ICD-10-CM | POA: Diagnosis not present

## 2019-03-22 DIAGNOSIS — D225 Melanocytic nevi of trunk: Secondary | ICD-10-CM | POA: Diagnosis not present

## 2019-05-03 DIAGNOSIS — J019 Acute sinusitis, unspecified: Secondary | ICD-10-CM | POA: Diagnosis not present

## 2019-05-03 DIAGNOSIS — Z6822 Body mass index (BMI) 22.0-22.9, adult: Secondary | ICD-10-CM | POA: Diagnosis not present

## 2019-06-24 DIAGNOSIS — Z3041 Encounter for surveillance of contraceptive pills: Secondary | ICD-10-CM | POA: Diagnosis not present

## 2019-06-24 DIAGNOSIS — L700 Acne vulgaris: Secondary | ICD-10-CM | POA: Diagnosis not present

## 2019-07-23 DIAGNOSIS — D225 Melanocytic nevi of trunk: Secondary | ICD-10-CM | POA: Diagnosis not present

## 2019-07-23 DIAGNOSIS — L700 Acne vulgaris: Secondary | ICD-10-CM | POA: Diagnosis not present

## 2019-07-23 DIAGNOSIS — L300 Nummular dermatitis: Secondary | ICD-10-CM | POA: Diagnosis not present

## 2019-09-13 DIAGNOSIS — Z20828 Contact with and (suspected) exposure to other viral communicable diseases: Secondary | ICD-10-CM | POA: Diagnosis not present

## 2019-09-14 DIAGNOSIS — R519 Headache, unspecified: Secondary | ICD-10-CM | POA: Diagnosis not present

## 2019-09-15 DIAGNOSIS — R519 Headache, unspecified: Secondary | ICD-10-CM | POA: Diagnosis not present

## 2019-09-20 DIAGNOSIS — Z20828 Contact with and (suspected) exposure to other viral communicable diseases: Secondary | ICD-10-CM | POA: Diagnosis not present

## 2019-09-21 DIAGNOSIS — R630 Anorexia: Secondary | ICD-10-CM | POA: Diagnosis not present

## 2019-09-21 DIAGNOSIS — R519 Headache, unspecified: Secondary | ICD-10-CM | POA: Diagnosis not present

## 2019-09-23 DIAGNOSIS — R05 Cough: Secondary | ICD-10-CM | POA: Diagnosis not present

## 2019-09-23 DIAGNOSIS — Z20828 Contact with and (suspected) exposure to other viral communicable diseases: Secondary | ICD-10-CM | POA: Diagnosis not present

## 2019-09-27 DIAGNOSIS — R05 Cough: Secondary | ICD-10-CM | POA: Diagnosis not present

## 2019-09-27 DIAGNOSIS — R519 Headache, unspecified: Secondary | ICD-10-CM | POA: Diagnosis not present

## 2019-09-28 DIAGNOSIS — Z20828 Contact with and (suspected) exposure to other viral communicable diseases: Secondary | ICD-10-CM | POA: Diagnosis not present

## 2019-10-04 ENCOUNTER — Ambulatory Visit: Payer: BC Managed Care – PPO | Admitting: Neurology

## 2019-10-12 DIAGNOSIS — Z20828 Contact with and (suspected) exposure to other viral communicable diseases: Secondary | ICD-10-CM | POA: Diagnosis not present

## 2019-10-19 DIAGNOSIS — Z20828 Contact with and (suspected) exposure to other viral communicable diseases: Secondary | ICD-10-CM | POA: Diagnosis not present

## 2019-10-26 DIAGNOSIS — Z20828 Contact with and (suspected) exposure to other viral communicable diseases: Secondary | ICD-10-CM | POA: Diagnosis not present

## 2019-11-09 NOTE — Progress Notes (Signed)
[facility]    Provider:  Dr Lucia Gaskins Requesting Provider: Lance Bosch, NP Primary Care Provider:  Lance Bosch, NP  CC:  Headache   HPI:  Nicole Morgan is a 24 y.o. female here as requested by Lance Bosch, NP for headache for 3 to 4 weeks.  Past medical history of depression.  I reviewed Lance Bosch notes, no vision changes, on top of the head, headache can be worse with cough, no shortness of breath, no fever, CT of the head was normal, and clear etiology possibly viral, she was treated with prednisone, she has nausea but no vomiting, it started the beginning of December, starts at the top of the head, wakes up with headaches get worse throughout the day, goes down both temples, no neck stiffness, no fever chills, no sinus congestion, no allergic rhinitis, no changes with ibuprofen or Tylenol, she was given a shot of Toradol at [facility] without a change at all, never had headaches like this before history of mild nonmigraine type headaches, no vision changes does feel foggy with poor appetite, very atypical for patient.  She was also tried on Neurontin 100 mg 1-3 p.o. every 8 as needed pain, also outi7, CT of the head within normal.  On December 31 however it did say that her headaches were improving continue Indocin and naproxen, also given Zofran.,  Negative SARS-CoV-2 test however I am looking at this lab core report really not sure which lab test this is whether it is acute for detecting chronic antibodies.  She is here alone, unclear etiology of headaches, started dec 2019, it came on one day acutely an was very intense, worst headache of life, she would go to sleep with it an din the morning too. She felt pressure, the more she does the worse it is, walking, bending over, waking up in the morning, coughing, starts pressure left side of the head, temporoparietal, will radiate down the back of her neck and may radiate to the sies of her head, severe, indomethacin  helped, feels like a pressure, she has ligjht and sound sensitivity, nausea and vomiting, can last for 24-72 hours or longer. Sleeping may help, sitting in a dark room may help, she is a poor sleeper, Gabapentin helps, still having them daily, when severe she can have vision changes, stiff neck, she is experiencing anxiety, insomnia. No weakness. Unknown other inciting events, she was ill but the headache started prior to the illness. No known family history of migraines, she is adopted.   Reviewed notes, labs and imaging from outside physicians, which showed: see above  Review of Systems: Patient complains of symptoms per HPI as well as the following symptoms: insomnia, anxiety. Pertinent negatives and positives per HPI. All others negative.   Social History   Socioeconomic History  . Marital status: Single    Spouse name: Not on file  . Number of children: 1  . Years of education: Not on file  . Highest education level: Some college, no degree  Occupational History  . Not on file  Tobacco Use  . Smoking status: Former Smoker    Packs/day: 0.25    Years: 1.00    Pack years: 0.25    Types: Cigarettes    Quit date: 02/22/2012    Years since quitting: 7.7  . Smokeless tobacco: Never Used  Substance and Sexual Activity  . Alcohol use: Yes    Comment: 1-2 beers every now and then  . Drug use: No  . Sexual  activity: Yes    Birth control/protection: Pill  Other Topics Concern  . Not on file  Social History Narrative   Lives with child    Right handed   Caffeine: about 3 cups/day maybe more   Social Determinants of Health   Financial Resource Strain:   . Difficulty of Paying Living Expenses: Not on file  Food Insecurity:   . Worried About Programme researcher, broadcasting/film/video in the Last Year: Not on file  . Ran Out of Food in the Last Year: Not on file  Transportation Needs:   . Lack of Transportation (Medical): Not on file  . Lack of Transportation (Non-Medical): Not on file  Physical  Activity:   . Days of Exercise per Week: Not on file  . Minutes of Exercise per Session: Not on file  Stress:   . Feeling of Stress : Not on file  Social Connections:   . Frequency of Communication with Friends and Family: Not on file  . Frequency of Social Gatherings with Friends and Family: Not on file  . Attends Religious Services: Not on file  . Active Member of Clubs or Organizations: Not on file  . Attends Banker Meetings: Not on file  . Marital Status: Not on file  Intimate Partner Violence:   . Fear of Current or Ex-Partner: Not on file  . Emotionally Abused: Not on file  . Physically Abused: Not on file  . Sexually Abused: Not on file    Family History  Problem Relation Age of Onset  . Bipolar disorder Mother        schizophrenic  . Mental illness Mother        schizophrenia  . Deep vein thrombosis Maternal Aunt   . Alcohol abuse Maternal Grandmother   . Mental illness Maternal Grandmother   . Bipolar disorder Maternal Grandmother        schizophrenic   . Cancer Maternal Grandmother        lymph nodes  . Migraines Neg Hx   . Headache Neg Hx     Past Medical History:  Diagnosis Date  . Cystic fibrosis carrier   . Depression    no meds  . Eczema   . Incompetent cervix     There are no problems to display for this patient.   Past Surgical History:  Procedure Laterality Date  . CERVICAL CERCLAGE  06/06/2012   Procedure: CERCLAGE CERVICAL;  Surgeon: Bing Plume, MD;  Location: [facility];  Service: Gynecology;  Laterality: N/A;  . NO PAST SURGERIES    . WISDOM TOOTH EXTRACTION      Current Outpatient Medications  Medication Sig Dispense Refill  . drospirenone-ethinyl estradiol (YAZ) 3-0.02 MG tablet Take 1 tablet by mouth daily.    Marland Kitchen gabapentin (NEURONTIN) 300 MG capsule Take 2 capsules (600 mg total) by mouth 3 (three) times daily. 180 capsule 6  . ibuprofen (ADVIL) 200 MG tablet Take 200 mg by mouth as needed.    . predniSONE (DELTASONE)  20 MG tablet Take 10 mg by mouth every other day.    . spironolactone (ALDACTONE) 100 MG tablet Take 100 mg by mouth daily.    Marland Kitchen amitriptyline (ELAVIL) 10 MG tablet Take 1 tablet (10 mg total) by mouth at bedtime. 30 tablet 3  . ondansetron (ZOFRAN-ODT) 4 MG disintegrating tablet Take 1 tablet (4 mg total) by mouth every 8 (eight) hours as needed for nausea. For nausea or migraine. May take with Rizatriptan. 30 tablet 3  .  rizatriptan (MAXALT-MLT) 10 MG disintegrating tablet Take 1 tablet (10 mg total) by mouth as needed for migraine. May repeat in 2 hours if needed 9 tablet 11   No current facility-administered medications for this visit.    Allergies as of 11/10/2019  . (No Known Allergies)    Vitals: BP 126/80 (BP Location: Left Arm, Patient Position: Sitting)   Pulse 96   Temp (!) 97.5 F (36.4 C) Comment: taken at front  Ht 5\' 1"  (1.549 m)   Wt 109 lb (49.4 kg)   BMI 20.60 kg/m  Last Weight:  Wt Readings from Last 1 Encounters:  11/10/19 109 lb (49.4 kg)   Last Height:   Ht Readings from Last 1 Encounters:  11/10/19 5\' 1"  (1.549 m)     Physical exam: Exam: Gen: NAD, conversant, well nourised, well groomed                     CV: RRR, no MRG. No Carotid Bruits. No peripheral edema, warm, nontender Eyes: Conjunctivae clear without exudates or hemorrhage  Neuro: Detailed Neurologic Exam  Speech:    Speech is normal; fluent and spontaneous with normal comprehension.  Cognition:    The patient is oriented to person, place, and time;     recent and remote memory intact;     language fluent;     normal attention, concentration,     fund of knowledge Cranial Nerves:    The pupils are equal, round, and reactive to light. The fundi are normal and spontaneous venous pulsations are present. Visual fields are full to finger confrontation. Extraocular movements are intact. Trigeminal sensation is intact and the muscles of mastication are normal. The face is symmetric. The  palate elevates in the midline. Hearing intact. Voice is normal. Shoulder shrug is normal. The tongue has normal motion without fasciculations.   Coordination:    Normal finger to nose and heel to shin. Normal rapid alternating movements.   Gait:    Heel-toe and tandem gait are normal.   Motor Observation:    No asymmetry, no atrophy, and no involuntary movements noted. Tone:    Normal muscle tone.    Posture:    Posture is normal. normal erect    Strength:    Strength is V/V in the upper and lower limbs.      Sensation: intact to LT     Reflex Exam:  DTR's:    Deep tendon reflexes in the upper and lower extremities are normal bilaterally.   Toes:    The toes are downgoing bilaterally.   Clonus:    Clonus is absent.    Assessment/Plan:  This is a 24 year old patient with what sounds like chronic migraines without aura. However given concerning symptoms and atypical presentation I do think that patient should have an MRI of the brain with and without contrast, she has never had imaging before, also needs blood work.  Amitriptyline at bedttime for headache prevention and may also help with her insomnia, she denies any cardiac issues. Discussed teratogenicity do not get pregnant, can also increase gabapentin to tid since it is helping and complete steroids per pcp.  Maxalt and Zofran acutely  MRI brain and MRA head due to concerning symptoms of morning headaches, positional headaches,occipital headaches, exertional headache, vision changes, left-sided umbness  to look for space occupying mass, chiari or intracranial hypertension (pseudotumor), MS, aneurysm or other.  Blood work  Discussed: To prevent or relieve headaches, try the following: Cool Compress.  Lie down and place a cool compress on your head.  Avoid headache triggers. If certain foods or odors seem to have triggered your migraines in the past, avoid them. A headache diary might help you identify triggers.  Include  physical activity in your daily routine. Try a daily walk or other moderate aerobic exercise.  Manage stress. Find healthy ways to cope with the stressors, such as delegating tasks on your to-do list.  Practice relaxation techniques. Try deep breathing, yoga, massage and visualization.  Eat regularly. Eating regularly scheduled meals and maintaining a healthy diet might help prevent headaches. Also, drink plenty of fluids.  Follow a regular sleep schedule. Sleep deprivation might contribute to headaches Consider biofeedback. With this mind-body technique, you learn to control certain bodily functions -- such as muscle tension, heart rate and blood pressure -- to prevent headaches or reduce headache pain.    Proceed to emergency room if you experience new or worsening symptoms or symptoms do not resolve, if you have new neurologic symptoms or if headache is severe, or for any concerning symptom.   Provided education and documentation from American headache Society toolbox including articles on: chronic migraine medication overuse headache, chronic migraines, prevention of migraines, behavioral and other nonpharmacologic treatments for headache.   Orders Placed This Encounter  Procedures  . MR BRAIN W WO CONTRAST  . MR ANGIO HEAD WO CONTRAST  . Comprehensive metabolic panel  . CBC  . TSH   Meds ordered this encounter  Medications  . amitriptyline (ELAVIL) 10 MG tablet    Sig: Take 1 tablet (10 mg total) by mouth at bedtime.    Dispense:  30 tablet    Refill:  3  . rizatriptan (MAXALT-MLT) 10 MG disintegrating tablet    Sig: Take 1 tablet (10 mg total) by mouth as needed for migraine. May repeat in 2 hours if needed    Dispense:  9 tablet    Refill:  11  . ondansetron (ZOFRAN-ODT) 4 MG disintegrating tablet    Sig: Take 1 tablet (4 mg total) by mouth every 8 (eight) hours as needed for nausea. For nausea or migraine. May take with Rizatriptan.    Dispense:  30 tablet    Refill:  3  .  gabapentin (NEURONTIN) 300 MG capsule    Sig: Take 2 capsules (600 mg total) by mouth 3 (three) times daily.    Dispense:  180 capsule    Refill:  6    Cc: Ferd Hibbs, NP  Sarina Ill, MD  [facility] [address] [place], [place] [address]  Phone [phone] Fax [phone]

## 2019-11-10 ENCOUNTER — Encounter: Payer: Self-pay | Admitting: Neurology

## 2019-11-10 ENCOUNTER — Other Ambulatory Visit: Payer: Self-pay

## 2019-11-10 ENCOUNTER — Ambulatory Visit: Payer: BC Managed Care – PPO | Admitting: Neurology

## 2019-11-10 VITALS — BP 126/80 | HR 96 | Temp 97.5°F | Ht 61.0 in | Wt 109.0 lb

## 2019-11-10 DIAGNOSIS — R519 Headache, unspecified: Secondary | ICD-10-CM

## 2019-11-10 DIAGNOSIS — G4484 Primary exertional headache: Secondary | ICD-10-CM | POA: Diagnosis not present

## 2019-11-10 DIAGNOSIS — H539 Unspecified visual disturbance: Secondary | ICD-10-CM

## 2019-11-10 DIAGNOSIS — R51 Headache with orthostatic component, not elsewhere classified: Secondary | ICD-10-CM

## 2019-11-10 DIAGNOSIS — G43711 Chronic migraine without aura, intractable, with status migrainosus: Secondary | ICD-10-CM

## 2019-11-10 MED ORDER — AMITRIPTYLINE HCL 10 MG PO TABS: 10.0000 mg | ORAL_TABLET | Freq: Every day | ORAL | 3 refills | Status: DC

## 2019-11-10 MED ORDER — RIZATRIPTAN BENZOATE 10 MG PO TBDP: 10.0000 mg | ORAL_TABLET | ORAL | 11 refills | Status: DC | PRN

## 2019-11-10 MED ORDER — GABAPENTIN 300 MG PO CAPS: 600.0000 mg | ORAL_CAPSULE | Freq: Three times a day (TID) | ORAL | 6 refills | Status: DC

## 2019-11-10 MED ORDER — ONDANSETRON 4 MG PO TBDP: 4.0000 mg | ORAL_TABLET | Freq: Three times a day (TID) | ORAL | 3 refills | Status: DC | PRN

## 2019-11-10 NOTE — Patient Instructions (Addendum)
Start amitriptyline at bedtime this is a good migraine medication and may also help with your sleeping Can take Gabapentin 3x a day Rizatriptan:Please take one tablet at the onset of your headache. If it does not improve the symptoms please take one additional tablet. Do not take more then 2 tablets in 24hrs. Do not take use more then 2 to 3 times in a week. Ondansetron: For nausea or migraine. May take with Rizatriptan. MRI of the brain Blood work  Ondansetron oral dissolving tablet What is this medicine? ONDANSETRON (on DAN se tron) is used to treat nausea and vomiting caused by chemotherapy. It is also used to prevent or treat nausea and vomiting after surgery. This medicine may be used for other purposes; ask your health care provider or pharmacist if you have questions. COMMON BRAND NAME(S): Zofran ODT What should I tell my health care provider before I take this medicine? They need to know if you have any of these conditions:  heart disease  history of irregular heartbeat  liver disease  low levels of magnesium or potassium in the blood  an unusual or allergic reaction to ondansetron, granisetron, other medicines, foods, dyes, or preservatives  pregnant or trying to get pregnant  breast-feeding How should I use this medicine? These tablets are made to dissolve in the mouth. Do not try to push the tablet through the foil backing. With dry hands, peel away the foil backing and gently remove the tablet. Place the tablet in the mouth and allow it to dissolve, then swallow. While you may take these tablets with water, it is not necessary to do so. Talk to your pediatrician regarding the use of this medicine in children. Special care may be needed. Overdosage: If you think you have taken too much of this medicine contact a poison control center or emergency room at once. NOTE: This medicine is only for you. Do not share this medicine with others. What if I miss a dose? If you miss a  dose, take it as soon as you can. If it is almost time for your next dose, take only that dose. Do not take double or extra doses. What may interact with this medicine? Do not take this medicine with any of the following medications:  apomorphine  certain medicines for fungal infections like fluconazole, itraconazole, ketoconazole, posaconazole, voriconazole  cisapride  dronedarone  pimozide  thioridazine This medicine may also interact with the following medications:  carbamazepine  certain medicines for depression, anxiety, or psychotic disturbances  fentanyl  linezolid  MAOIs like Carbex, Eldepryl, Marplan, Nardil, and Parnate  methylene blue (injected into a vein)  other medicines that prolong the QT interval (cause an abnormal heart rhythm) like dofetilide, ziprasidone  phenytoin  rifampicin  tramadol This list may not describe all possible interactions. Give your health care provider a list of all the medicines, herbs, non-prescription drugs, or dietary supplements you use. Also tell them if you smoke, drink alcohol, or use illegal drugs. Some items may interact with your medicine. What should I watch for while using this medicine? Check with your doctor or health care professional as soon as you can if you have any sign of an allergic reaction. What side effects may I notice from receiving this medicine? Side effects that you should report to your doctor or health care professional as soon as possible:  allergic reactions like skin rash, itching or hives, swelling of the face, lips, or tongue  breathing problems  confusion  dizziness  fast or irregular heartbeat  feeling faint or lightheaded, falls  fever and chills  loss of balance or coordination  seizures  sweating  swelling of the hands and feet  tightness in the chest  tremors  unusually weak or tired Side effects that usually do not require medical attention (report to your doctor or  health care professional if they continue or are bothersome):  constipation or diarrhea  headache This list may not describe all possible side effects. Call your doctor for medical advice about side effects. You may report side effects to FDA at 1-800-FDA-1088. Where should I keep my medicine? Keep out of the reach of children. Store between 2 and 30 degrees C (36 and 86 degrees F). Throw away any unused medicine after the expiration date. NOTE: This sheet is a summary. It may not cover all possible information. If you have questions about this medicine, talk to your doctor, pharmacist, or health care provider.  2020 Elsevier/Gold Standard (2018-09-01 07:14:10) Rizatriptan disintegrating tablets What is this medicine? RIZATRIPTAN (rye za TRIP tan) is used to treat migraines with or without aura. An aura is a strange feeling or visual disturbance that warns you of an attack. It is not used to prevent migraines. This medicine may be used for other purposes; ask your health care provider or pharmacist if you have questions. COMMON BRAND NAME(S): Maxalt-MLT What should I tell my health care provider before I take this medicine? They need to know if you have any of these conditions:  cigarette smoker  circulation problems in fingers and toes  diabetes  heart disease  high blood pressure  high cholesterol  history of irregular heartbeat  history of stroke  kidney disease  liver disease  stomach or intestine problems  an unusual or allergic reaction to rizatriptan, other medicines, foods, dyes, or preservatives  pregnant or trying to get pregnant  breast-feeding How should I use this medicine? Take this medicine by mouth. Follow the directions on the prescription label. Leave the tablet in the sealed blister pack until you are ready to take it. With dry hands, open the blister and gently remove the tablet. If the tablet breaks or crumbles, throw it away and take a new tablet  out of the blister pack. Place the tablet in the mouth and allow it to dissolve, and then swallow. Do not cut, crush, or chew this medicine. You do not need water to take this medicine. Do not take it more often than directed. Talk to your pediatrician regarding the use of this medicine in children. While this drug may be prescribed for children as young as 6 years for selected conditions, precautions do apply. Overdosage: If you think you have taken too much of this medicine contact a poison control center or emergency room at once. NOTE: This medicine is only for you. Do not share this medicine with others. What if I miss a dose? This does not apply. This medicine is not for regular use. What may interact with this medicine? Do not take this medicine with any of the following medicines:  certain medicines for migraine headache like almotriptan, eletriptan, frovatriptan, naratriptan, rizatriptan, sumatriptan, zolmitriptan  ergot alkaloids like dihydroergotamine, ergonovine, ergotamine, methylergonovine  MAOIs like Carbex, Eldepryl, Marplan, Nardil, and Parnate This medicine may also interact with the following medications:  certain medicines for depression, anxiety, or psychotic disorders  propranolol This list may not describe all possible interactions. Give your health care provider a list of all the medicines, herbs, non-prescription  drugs, or dietary supplements you use. Also tell them if you smoke, drink alcohol, or use illegal drugs. Some items may interact with your medicine. What should I watch for while using this medicine? Visit your healthcare professional for regular checks on your progress. Tell your healthcare professional if your symptoms do not start to get better or if they get worse. You may get drowsy or dizzy. Do not drive, use machinery, or do anything that needs mental alertness until you know how this medicine affects you. Do not stand up or sit up quickly, especially if  you are an older patient. This reduces the risk of dizzy or fainting spells. Alcohol may interfere with the effect of this medicine. Your mouth may get dry. Chewing sugarless gum or sucking hard candy and drinking plenty of water may help. Contact your healthcare professional if the problem does not go away or is severe. If you take migraine medicines for 10 or more days a month, your migraines may get worse. Keep a diary of headache days and medicine use. Contact your healthcare professional if your migraine attacks occur more frequently. What side effects may I notice from receiving this medicine? Side effects that you should report to your doctor or health care professional as soon as possible:  allergic reactions like skin rash, itching or hives, swelling of the face, lips, or tongue  chest pain or chest tightness  signs and symptoms of a dangerous change in heartbeat or heart rhythm like chest pain; dizziness; fast, irregular heartbeat; palpitations; feeling faint or lightheaded; falls; breathing problems  signs and symptoms of a stroke like changes in vision; confusion; trouble speaking or understanding; severe headaches; sudden numbness or weakness of the face, arm or leg; trouble walking; dizziness; loss of balance or coordination  signs and symptoms of serotonin syndrome like irritable; confusion; diarrhea; fast or irregular heartbeat; muscle twitching; stiff muscles; trouble walking; sweating; high fever; seizures; chills; vomiting Side effects that usually do not require medical attention (report to your doctor or health care professional if they continue or are bothersome):  diarrhea  dizziness  drowsiness  dry mouth  headache  nausea, vomiting  pain, tingling, numbness in the hands or feet  stomach pain This list may not describe all possible side effects. Call your doctor for medical advice about side effects. You may report side effects to FDA at 1-800-FDA-1088. Where  should I keep my medicine? Keep out of the reach of children. Store at room temperature between 15 and 30 degrees C (59 and 86 degrees F). Protect from light and moisture. Throw away any unused medicine after the expiration date. NOTE: This sheet is a summary. It may not cover all possible information. If you have questions about this medicine, talk to your doctor, pharmacist, or health care provider.  2020 [employer]vier/Gold Standard (2018-03-24 14:58:08)Amitriptyline tablets What is this medicine? AMITRIPTYLINE (a mee TRIP ti leen) is used to treat depression. This medicine may be used for other purposes; ask your health care provider or pharmacist if you have questions. COMMON BRAND NAME(S): Elavil, Vanatrip What should I tell my health care provider before I take this medicine? They need to know if you have any of these conditions:  an alcohol problem  asthma, difficulty breathing  bipolar disorder or schizophrenia  difficulty passing urine, prostate trouble  glaucoma  heart disease or previous heart attack  liver disease  over active thyroid  seizures  thoughts or plans of suicide, a previous suicide attempt, or family history  of suicide attempt  an unusual or allergic reaction to amitriptyline, other medicines, foods, dyes, or preservatives  pregnant or trying to get pregnant  breast-feeding How should I use this medicine? Take this medicine by mouth with a drink of water. Follow the directions on the prescription label. You can take the tablets with or without food. Take your medicine at regular intervals. Do not take it more often than directed. Do not stop taking this medicine suddenly except upon the advice of your doctor. Stopping this medicine too quickly may cause serious side effects or your condition may worsen. A special MedGuide will be given to you by the pharmacist with each prescription and refill. Be sure to read this information carefully each time. Talk to  your pediatrician regarding the use of this medicine in children. Special care may be needed. Overdosage: If you think you have taken too much of this medicine contact a poison control center or emergency room at once. NOTE: This medicine is only for you. Do not share this medicine with others. What if I miss a dose? If you miss a dose, take it as soon as you can. If it is almost time for your next dose, take only that dose. Do not take double or extra doses. What may interact with this medicine? Do not take this medicine with any of the following medications:  arsenic trioxide  certain medicines used to regulate abnormal heartbeat or to treat other heart conditions  cisapride  droperidol  halofantrine  linezolid  MAOIs like Carbex, Eldepryl, Marplan, Nardil, and Parnate  methylene blue  other medicines for mental depression  phenothiazines like perphenazine, thioridazine and chlorpromazine  pimozide  probucol  procarbazine  sparfloxacin  St. John's Wort This medicine may also interact with the following medications:  atropine and related drugs like hyoscyamine, scopolamine, tolterodine and others  barbiturate medicines for inducing sleep or treating seizures, like phenobarbital  cimetidine  disulfiram  ethchlorvynol  thyroid hormones such as levothyroxine  ziprasidone This list may not describe all possible interactions. Give your health care provider a list of all the medicines, herbs, non-prescription drugs, or dietary supplements you use. Also tell them if you smoke, drink alcohol, or use illegal drugs. Some items may interact with your medicine. What should I watch for while using this medicine? Tell your doctor if your symptoms do not get better or if they get worse. Visit your doctor or health care professional for regular checks on your progress. Because it may take several weeks to see the full effects of this medicine, it is important to continue your  treatment as prescribed by your doctor. Patients and their families should watch out for new or worsening thoughts of suicide or depression. Also watch out for sudden changes in feelings such as feeling anxious, agitated, panicky, irritable, hostile, aggressive, impulsive, severely restless, overly excited and hyperactive, or not being able to sleep. If this happens, especially at the beginning of treatment or after a change in dose, call your health care professional. Nicole Morgan may get drowsy or dizzy. Do not drive, use machinery, or do anything that needs mental alertness until you know how this medicine affects you. Do not stand or sit up quickly, especially if you are an older patient. This reduces the risk of dizzy or fainting spells. Alcohol may interfere with the effect of this medicine. Avoid alcoholic drinks. Do not treat yourself for coughs, colds, or allergies without asking your doctor or health care professional for advice. Some ingredients  can increase possible side effects. Your mouth may get dry. Chewing sugarless gum or sucking hard candy, and drinking plenty of water will help. Contact your doctor if the problem does not go away or is severe. This medicine may cause dry eyes and blurred vision. If you wear contact lenses you may feel some discomfort. Lubricating drops may help. See your eye doctor if the problem does not go away or is severe. This medicine can cause constipation. Try to have a bowel movement at least every 2 to 3 days. If you do not have a bowel movement for 3 days, call your doctor or health care professional. This medicine can make you more sensitive to the sun. Keep out of the sun. If you cannot avoid being in the sun, wear protective clothing and use sunscreen. Do not use sun lamps or tanning beds/booths. What side effects may I notice from receiving this medicine? Side effects that you should report to your doctor or health care professional as soon as possible:  allergic  reactions like skin rash, itching or hives, swelling of the face, lips, or tongue  anxious  breathing problems  changes in vision  confusion  elevated mood, decreased need for sleep, racing thoughts, impulsive behavior  eye pain  fast, irregular heartbeat  feeling faint or lightheaded, falls  feeling agitated, angry, or irritable  fever with increased sweating  hallucination, loss of contact with reality  seizures  stiff muscles  suicidal thoughts or other mood changes  tingling, pain, or numbness in the feet or hands  trouble passing urine or change in the amount of urine  trouble sleeping  unusually weak or tired  vomiting  yellowing of the eyes or skin Side effects that usually do not require medical attention (report to your doctor or health care professional if they continue or are bothersome):  change in sex drive or performance  change in appetite or weight  constipation  dizziness  dry mouth  nausea  tired  tremors  upset stomach This list may not describe all possible side effects. Call your doctor for medical advice about side effects. You may report side effects to FDA at 1-800-FDA-1088. Where should I keep my medicine? Keep out of the reach of children. Store at room temperature between 20 and 25 degrees C (68 and 77 degrees F). Throw away any unused medicine after the expiration date. NOTE: This sheet is a summary. It may not cover all possible information. If you have questions about this medicine, talk to your doctor, pharmacist, or health care provider.  2020 [employer]/Gold Standard (2018-09-01 13:04:32)

## 2019-11-11 ENCOUNTER — Telehealth: Payer: Self-pay | Admitting: *Deleted

## 2019-11-11 LAB — CBC
Hematocrit: 37.3 % (ref 34.0–46.6)
Hemoglobin: 12.7 g/dL (ref 11.1–15.9)
MCH: 31.6 pg (ref 26.6–33.0)
MCHC: 34 g/dL (ref 31.5–35.7)
MCV: 93 fL (ref 79–97)
Platelets: 260 10*3/uL (ref 150–450)
RBC: 4.02 x10E6/uL (ref 3.77–5.28)
RDW: 12.4 % (ref 11.7–15.4)
WBC: 4.3 10*3/uL (ref 3.4–10.8)

## 2019-11-11 LAB — COMPREHENSIVE METABOLIC PANEL
ALT: 11 IU/L (ref 0–32)
AST: 14 IU/L (ref 0–40)
Albumin/Globulin Ratio: 2.3 — ABNORMAL HIGH (ref 1.2–2.2)
Albumin: 4.6 g/dL (ref 3.9–5.0)
Alkaline Phosphatase: 37 IU/L — ABNORMAL LOW (ref 39–117)
BUN/Creatinine Ratio: 13 (ref 9–23)
BUN: 9 mg/dL (ref 6–20)
Bilirubin Total: 0.5 mg/dL (ref 0.0–1.2)
CO2: 23 mmol/L (ref 20–29)
Calcium: 9.6 mg/dL (ref 8.7–10.2)
Chloride: 104 mmol/L (ref 96–106)
Creatinine, Ser: 0.72 mg/dL (ref 0.57–1.00)
GFR calc Af Amer: 135 mL/min/{1.73_m2} (ref >59)
GFR calc non Af Amer: 117 mL/min/{1.73_m2} (ref >59)
Globulin, Total: 2 g/dL (ref 1.5–4.5)
Glucose: 95 mg/dL (ref 65–99)
Potassium: 4.8 mmol/L (ref 3.5–5.2)
Sodium: 140 mmol/L (ref 134–144)
Total Protein: 6.6 g/dL (ref 6.0–8.5)

## 2019-11-11 LAB — TSH: TSH: 0.372 u[IU]/mL — ABNORMAL LOW (ref 0.450–4.500)

## 2019-11-11 NOTE — Telephone Encounter (Signed)
Received a PA from [employer] for Ondansetron 4 mg dispersible tablets. I attempted to complete KEY: [id number] and received this message:  Authorization already on file for this request.

## 2019-11-11 NOTE — Telephone Encounter (Signed)
Received a PA for Rizatriptan 10 mg from [employer]. Attempted to completed KEY: [id number] and received this message from plan: Authorization already on file for this request.   I called [facility], spoke with Victorino Dike, and confirmed pt was able to fill Rizatriptan and Ondansetron.

## 2019-11-16 ENCOUNTER — Telehealth: Payer: Self-pay | Admitting: *Deleted

## 2019-11-16 NOTE — Telephone Encounter (Signed)
LVM informing her thyroid test is abnormal, and this may indicate hyperthyroidism which can cause headaches. Dr Lucia Gaskins stated to follow up with her primary care, and Dr Lucia Gaskins included her on this communication.  Left # for questions.

## 2019-11-18 ENCOUNTER — Telehealth: Payer: Self-pay | Admitting: Neurology

## 2019-11-18 NOTE — Telephone Encounter (Signed)
I was unable to get a hold of the patient because her mail box was full.  Her insurance card is not scanned in and I need the provider phone number on the back of her card to be able to get the benefits for her MRI. If the patient calls back please ask her for this phone number.

## 2019-11-22 NOTE — Telephone Encounter (Signed)
Spoke to the patient and due to some of her blood work coming back with her pcp she is wanting to speak to them first before proceeding on having the images. She stated she will get back to me when she is ready to schedule the images.

## 2019-11-30 ENCOUNTER — Ambulatory Visit (INDEPENDENT_AMBULATORY_CARE_PROVIDER_SITE_OTHER): Payer: BC Managed Care – PPO | Admitting: Adult Health

## 2019-11-30 ENCOUNTER — Encounter: Payer: Self-pay | Admitting: Adult Health

## 2019-11-30 ENCOUNTER — Other Ambulatory Visit: Payer: Self-pay

## 2019-11-30 VITALS — BP 131/83 | HR 85 | Ht 62.0 in | Wt 105.0 lb

## 2019-11-30 DIAGNOSIS — F331 Major depressive disorder, recurrent, moderate: Secondary | ICD-10-CM

## 2019-11-30 DIAGNOSIS — F319 Bipolar disorder, unspecified: Secondary | ICD-10-CM

## 2019-11-30 DIAGNOSIS — F411 Generalized anxiety disorder: Secondary | ICD-10-CM | POA: Diagnosis not present

## 2019-11-30 DIAGNOSIS — F4310 Post-traumatic stress disorder, unspecified: Secondary | ICD-10-CM

## 2019-11-30 DIAGNOSIS — G4700 Insomnia, unspecified: Secondary | ICD-10-CM | POA: Diagnosis not present

## 2019-11-30 MED ORDER — ARIPIPRAZOLE 5 MG PO TABS
ORAL_TABLET | ORAL | 2 refills | Status: DC
Start: 2019-11-30 — End: 2019-12-28
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
## 2019-12-08 ENCOUNTER — Telehealth: Payer: Self-pay | Admitting: Neurology

## 2019-12-08 NOTE — Telephone Encounter (Signed)
Labs faxed to pcp through epic. Spoke with pt and let her know.

## 2019-12-08 NOTE — Telephone Encounter (Signed)
Also confirmed pcp is Lance Bosch NP.

## 2019-12-08 NOTE — Telephone Encounter (Signed)
Pt. Called and LVM asking for her blood work results to be sent in to her PCP. Please advise.

## 2019-12-28 ENCOUNTER — Other Ambulatory Visit: Payer: Self-pay

## 2019-12-28 ENCOUNTER — Encounter: Payer: Self-pay | Admitting: Adult Health

## 2019-12-28 ENCOUNTER — Ambulatory Visit (INDEPENDENT_AMBULATORY_CARE_PROVIDER_SITE_OTHER): Payer: BC Managed Care – PPO | Admitting: Adult Health

## 2019-12-28 DIAGNOSIS — F331 Major depressive disorder, recurrent, moderate: Secondary | ICD-10-CM | POA: Diagnosis not present

## 2019-12-28 DIAGNOSIS — F319 Bipolar disorder, unspecified: Secondary | ICD-10-CM

## 2019-12-28 DIAGNOSIS — F411 Generalized anxiety disorder: Secondary | ICD-10-CM | POA: Diagnosis not present

## 2019-12-28 DIAGNOSIS — F4310 Post-traumatic stress disorder, unspecified: Secondary | ICD-10-CM

## 2019-12-28 DIAGNOSIS — G4700 Insomnia, unspecified: Secondary | ICD-10-CM

## 2019-12-28 MED ORDER — QUETIAPINE FUMARATE 25 MG PO TABS
ORAL_TABLET | ORAL | 2 refills | Status: DC
Start: 2019-12-28 — End: 2020-01-25

## 2019-12-28 MED ORDER — ARIPIPRAZOLE 10 MG PO TABS: ORAL_TABLET | ORAL | 2 refills | Status: DC

## 2019-12-28 NOTE — Progress Notes (Signed)
Nicole Morgan [id number] Feb 09, 1996 24 y.o.  Subjective:   Patient ID:  Nicole Morgan is a 24 y.o. (DOB 03/24/1996) female.  Chief Complaint: No chief complaint on file.   HPI Kristyna P Gaulin presents to the office today for follow-up of MDD, GAD, insomnia, BPD, and PTSD.  Describes mood today as "down". Pleasant. Flat. Denies tearfulness. Mood symptoms - reports depression, anxiety, and irritability. Feels like addition of Abilify was helpful initially. Grandmother was able to see a difference with medication. Stating "she said I seemed to be more happy and smiling". Also stating "I thought this is how I'm supposed to be". Has been "down in the dumps lately". Feels like dose needs to be increased. Also feels a lack of sleep has pulled mood down. Improved interest and motivation. Taking medication as prescribed.  Energy levels lower. Active, does not have a regular exercise routine. Works full-time at an assisted living facility. Enjoys some usual interests and activities. Has started painting again. Single. Lives with boyfriend of 8 years and their 48 y/o son. Son in first grade. Spending time with family.  Appetite adequate - "better than it was". Weight loss - feels like it's related to thyroid.  Sleeping difficulties. Averages 4 hours. Able to get to sleep. Has tried Trazadone and Elavil.  Focus and concentration "a little bit better". Completing tasks. Managing aspects of household. 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  Denies SI or HI. Denies AH or VH - a little bit better. Hears more than sees things. History of cutting  Previous medication trials: Wellbutrin, Zoloft - induced mania   Review of Systems:  Review of Systems  Musculoskeletal: Negative for gait problem.  Neurological: Negative for tremors.  Psychiatric/Behavioral:       Please refer to HPI    Medications: I have reviewed the patient's current medications.  Current Outpatient  Medications  Medication Sig Dispense Refill  . amitriptyline (ELAVIL) 10 MG tablet Take 1 tablet (10 mg total) by mouth at bedtime. 30 tablet 3  . ARIPiprazole (ABILIFY) 5 MG tablet Take 1/2 tablet daily x 7 days, then one tablet daily. 30 tablet 2  . drospirenone-ethinyl estradiol (YAZ) 3-0.02 MG tablet Take 1 tablet by mouth daily.    Marland Kitchen gabapentin (NEURONTIN) 300 MG capsule Take 2 capsules (600 mg total) by mouth 3 (three) times daily. 180 capsule 6  . ibuprofen (ADVIL) 200 MG tablet Take 200 mg by mouth as needed.    . ondansetron (ZOFRAN-ODT) 4 MG disintegrating tablet Take 1 tablet (4 mg total) by mouth every 8 (eight) hours as needed for nausea. For nausea or migraine. May take with Rizatriptan. 30 tablet 3  . predniSONE (DELTASONE) 20 MG tablet Take 10 mg by mouth every other day.    . rizatriptan (MAXALT-MLT) 10 MG disintegrating tablet Take 1 tablet (10 mg total) by mouth as needed for migraine. May repeat in 2 hours if needed 9 tablet 11  . spironolactone (ALDACTONE) 100 MG tablet Take 100 mg by mouth daily.     No current facility-administered medications for this visit.    Medication Side Effects: None  Allergies: No Known Allergies  Past Medical History:  Diagnosis Date  . Cystic fibrosis carrier   . Depression    no meds  . Eczema   . Incompetent cervix     Family History  Problem Relation Age of Onset  . Bipolar disorder Mother  schizophrenic  . Mental illness Mother        schizophrenia  . Deep vein thrombosis Maternal Aunt   . Alcohol abuse Maternal Grandmother   . Mental illness Maternal Grandmother   . Bipolar disorder Maternal Grandmother        schizophrenic   . Cancer Maternal Grandmother        lymph nodes  . Migraines Neg Hx   . Headache Neg Hx     Social History   Socioeconomic History  . Marital status: Single    Spouse name: Not on file  . Number of children: 1  . Years of education: Not on file  . Highest education level: Some  college, no degree  Occupational History  . Not on file  Tobacco Use  . Smoking status: Former Smoker    Packs/day: 0.25    Years: 1.00    Pack years: 0.25    Types: Cigarettes    Quit date: 02/22/2012    Years since quitting: 7.8  . Smokeless tobacco: Never Used  Substance and Sexual Activity  . Alcohol use: Yes    Comment: 1-2 beers every now and then  . Drug use: No  . Sexual activity: Yes    Birth control/protection: Pill  Other Topics Concern  . Not on file  Social History Narrative   Lives with child    Right handed   Caffeine: about 3 cups/day maybe more   Social Determinants of Health   Financial Resource Strain:   . Difficulty of Paying Living Expenses:   Food Insecurity:   . Worried About Charity fundraiser in the Last Year:   . Arboriculturist in the Last Year:   Transportation Needs:   . Film/video editor (Medical):   Marland Kitchen Lack of Transportation (Non-Medical):   Physical Activity:   . Days of Exercise per Week:   . Minutes of Exercise per Session:   Stress:   . Feeling of Stress :   Social Connections:   . Frequency of Communication with Friends and Family:   . Frequency of Social Gatherings with Friends and Family:   . Attends Religious Services:   . Active Member of Clubs or Organizations:   . Attends Archivist Meetings:   Marland Kitchen Marital Status:   Intimate Partner Violence:   . Fear of Current or Ex-Partner:   . Emotionally Abused:   Marland Kitchen Physically Abused:   . Sexually Abused:     Past Medical History, Surgical history, Social history, and Family history were reviewed and updated as appropriate.   Please see review of systems for further details on the patient's review from today.   Objective:   Physical Exam:  There were no vitals taken for this visit.  Physical Exam  Lab Review:     Component Value Date/Time   NA 140 11/10/2019 0858   K 4.8 11/10/2019 0858   CL 104 11/10/2019 0858   CO2 23 11/10/2019 0858   GLUCOSE 95  11/10/2019 0858   GLUCOSE 108 (H) 01/21/2009 1330   BUN 9 11/10/2019 0858   CREATININE 0.72 11/10/2019 0858   CALCIUM 9.6 11/10/2019 0858   PROT 6.6 11/10/2019 0858   ALBUMIN 4.6 11/10/2019 0858   AST 14 11/10/2019 0858   ALT 11 11/10/2019 0858   ALKPHOS 37 (L) 11/10/2019 0858   BILITOT 0.5 11/10/2019 0858   GFRNONAA 117 11/10/2019 0858   GFRAA 135 11/10/2019 0858       Component  Value Date/Time   WBC 4.3 11/10/2019 0858   WBC 14.5 (H) 10/17/2012 0550   RBC 4.02 11/10/2019 0858   RBC 2.98 (L) 10/17/2012 0550   HGB 12.7 11/10/2019 0858   HCT 37.3 11/10/2019 0858   PLT 260 11/10/2019 0858   MCV 93 11/10/2019 0858   MCH 31.6 11/10/2019 0858   MCH 28.2 10/17/2012 0550   MCHC 34.0 11/10/2019 0858   MCHC 33.3 10/17/2012 0550   RDW 12.4 11/10/2019 0858   LYMPHSABS 2.8 01/21/2009 2100   MONOABS 0.8 01/21/2009 2100   EOSABS 0.1 01/21/2009 2100   BASOSABS 0.1 01/21/2009 2100    No results found for: POCLITH, LITHIUM   No results found for: PHENYTOIN, PHENOBARB, VALPROATE, CBMZ   .res Assessment: Plan:    Plan:  PDMP reviewed  1. Increase Abilify 5mg  to 10mg  (7.5 for one week, then increase) 2. Add Seroquel 25mg  - 1 to 2 sleep.  3. Continue Gabapentin 600mg  TID   Avoid SSRI d/t mania  Set up with for therapy  RTC 4 weeks  Patient advised to contact office with any questions, adverse effects, or acute worsening in signs and symptoms.  Discussed potential metabolic side effects associated with atypical antipsychotics, as well as potential risk for movement side effects. Advised pt to contact office if movement side effects occur.    There are no diagnoses linked to this encounter.   Please see After Visit Summary for patient specific instructions.  Future Appointments  Date Time Provider Department Center  01/04/2020  3:30 PM Lomax, Amy, NP [facility] None    No orders of the defined types were placed in this  encounter.   -------------------------------

## 2020-01-04 ENCOUNTER — Encounter: Payer: Self-pay | Admitting: Family Medicine

## 2020-01-04 ENCOUNTER — Ambulatory Visit: Payer: BC Managed Care – PPO | Admitting: Family Medicine

## 2020-01-04 NOTE — Progress Notes (Deleted)
PATIENT: Nicole Morgan DOB: July 23, 1996  REASON FOR VISIT: follow up HISTORY FROM: patient  No chief complaint on file.    HISTORY OF PRESENT ILLNESS: Today 01/04/20 Nicole Morgan is a 24 y.o. female here today for follow up for migraines. MRI ordered. She was started on amitriptyline and told to increase gabapentin to TID dosing. Rizatriptan and Zofran provided for abortive therapy.    HISTORY: (copied from Dr Cathren Laine note on 11/10/2019)  HPI:  Nicole Morgan is a 24 y.o. female here as requested by Ferd Hibbs, NP for headache for 3 to 4 weeks.  Past medical history of depression.  I reviewed Ferd Hibbs notes, no vision changes, on top of the head, headache can be worse with cough, no shortness of breath, no fever, CT of the head was normal, and clear etiology possibly viral, she was treated with prednisone, she has nausea but no vomiting, it started the beginning of December, starts at the top of the head, wakes up with headaches get worse throughout the day, goes down both temples, no neck stiffness, no fever chills, no sinus congestion, no allergic rhinitis, no changes with ibuprofen or Tylenol, she was given a shot of Toradol at [facility] without a change at all, never had headaches like this before history of mild nonmigraine type headaches, no vision changes does feel foggy with poor appetite, very atypical for patient.  She was also tried on Neurontin 100 mg 1-3 p.o. every 8 as needed pain, also outi7, CT of the head within normal.  On December 31 however it did say that her headaches were improving continue Indocin and naproxen, also given Zofran.,  Negative SARS-CoV-2 test however I am looking at this lab core report really not sure which lab test this is whether it is acute for detecting chronic antibodies.  She is here alone, unclear etiology of headaches, started dec 2019, it came on one day acutely an was very intense, worst headache of life, she would go to sleep with it  an din the morning too. She felt pressure, the more she does the worse it is, walking, bending over, waking up in the morning, coughing, starts pressure left side of the head, temporoparietal, will radiate down the back of her neck and may radiate to the sies of her head, severe, indomethacin helped, feels like a pressure, she has ligjht and sound sensitivity, nausea and vomiting, can last for 24-72 hours or longer. Sleeping may help, sitting in a dark room may help, she is a poor sleeper, Gabapentin helps, still having them daily, when severe she can have vision changes, stiff neck, she is experiencing anxiety, insomnia. No weakness. Unknown other inciting events, she was ill but the headache started prior to the illness. No known family history of migraines, she is adopted.   Reviewed notes, labs and imaging from outside physicians, which showed: see above   REVIEW OF SYSTEMS: Out of a complete 14 system review of symptoms, the patient complains only of the following symptoms, and all other reviewed systems are negative.  ALLERGIES: No Known Allergies  HOME MEDICATIONS: Outpatient Medications Prior to Visit  Medication Sig Dispense Refill  . amitriptyline (ELAVIL) 10 MG tablet Take 1 tablet (10 mg total) by mouth at bedtime. 30 tablet 3  . ARIPiprazole (ABILIFY) 10 MG tablet Take one tablet daily. 30 tablet 2  . drospirenone-ethinyl estradiol (YAZ) 3-0.02 MG tablet Take 1 tablet by mouth daily.    Marland Kitchen gabapentin (NEURONTIN) 300 MG  capsule Take 2 capsules (600 mg total) by mouth 3 (three) times daily. 180 capsule 6  . ibuprofen (ADVIL) 200 MG tablet Take 200 mg by mouth as needed.    . ondansetron (ZOFRAN-ODT) 4 MG disintegrating tablet Take 1 tablet (4 mg total) by mouth every 8 (eight) hours as needed for nausea. For nausea or migraine. May take with Rizatriptan. 30 tablet 3  . predniSONE (DELTASONE) 20 MG tablet Take 10 mg by mouth every other day.    Marland Kitchen QUEtiapine (SEROQUEL) 25 MG tablet Take  one to two tablets at bedtime as needed for sleep. 60 tablet 2  . rizatriptan (MAXALT-MLT) 10 MG disintegrating tablet Take 1 tablet (10 mg total) by mouth as needed for migraine. May repeat in 2 hours if needed 9 tablet 11  . spironolactone (ALDACTONE) 100 MG tablet Take 100 mg by mouth daily.     No facility-administered medications prior to visit.    PAST MEDICAL HISTORY: Past Medical History:  Diagnosis Date  . Cystic fibrosis carrier   . Depression    no meds  . Eczema   . Incompetent cervix     PAST SURGICAL HISTORY: Past Surgical History:  Procedure Laterality Date  . CERVICAL CERCLAGE  06/06/2012   Procedure: CERCLAGE CERVICAL;  Surgeon: Bing Plume, MD;  Location: [facility];  Service: Gynecology;  Laterality: N/A;  . NO PAST SURGERIES    . WISDOM TOOTH EXTRACTION      FAMILY HISTORY: Family History  Problem Relation Age of Onset  . Bipolar disorder Mother        schizophrenic  . Mental illness Mother        schizophrenia  . Deep vein thrombosis Maternal Aunt   . Alcohol abuse Maternal Grandmother   . Mental illness Maternal Grandmother   . Bipolar disorder Maternal Grandmother        schizophrenic   . Cancer Maternal Grandmother        lymph nodes  . Migraines Neg Hx   . Headache Neg Hx     SOCIAL HISTORY: Social History   Socioeconomic History  . Marital status: Single    Spouse name: Not on file  . Number of children: 1  . Years of education: Not on file  . Highest education level: Some college, no degree  Occupational History  . Not on file  Tobacco Use  . Smoking status: Former Smoker    Packs/day: 0.25    Years: 1.00    Pack years: 0.25    Types: Cigarettes    Quit date: 02/22/2012    Years since quitting: 7.8  . Smokeless tobacco: Never Used  Substance and Sexual Activity  . Alcohol use: Yes    Comment: 1-2 beers every now and then  . Drug use: No  . Sexual activity: Yes    Birth control/protection: Pill  Other Topics Concern  . Not  on file  Social History Narrative   Lives with child    Right handed   Caffeine: about 3 cups/day maybe more   Social Determinants of Health   Financial Resource Strain:   . Difficulty of Paying Living Expenses:   Food Insecurity:   . Worried About Programme researcher, broadcasting/film/video in the Last Year:   . Barista in the Last Year:   Transportation Needs:   . Freight forwarder (Medical):   Marland Kitchen Lack of Transportation (Non-Medical):   Physical Activity:   . Days of Exercise per Week:   .  Minutes of Exercise per Session:   Stress:   . Feeling of Stress :   Social Connections:   . Frequency of Communication with Friends and Family:   . Frequency of Social Gatherings with Friends and Family:   . Attends Religious Services:   . Active Member of Clubs or Organizations:   . Attends Banker Meetings:   Marland Kitchen Marital Status:   Intimate Partner Violence:   . Fear of Current or Ex-Partner:   . Emotionally Abused:   Marland Kitchen Physically Abused:   . Sexually Abused:       PHYSICAL EXAM  There were no vitals filed for this visit. There is no height or weight on file to calculate BMI.  Generalized: Well developed, in no acute distress  Cardiology: normal rate and rhythm, no murmur noted Neurological examination  Mentation: Alert oriented to time, place, history taking. Follows all commands speech and language fluent Cranial nerve II-XII: Pupils were equal round reactive to light. Extraocular movements were full, visual field were full on confrontational test. Facial sensation and strength were normal. Uvula tongue midline. Head turning and shoulder shrug  were normal and symmetric. Motor: The motor testing reveals 5 over 5 strength of all 4 extremities. Good symmetric motor tone is noted throughout.  Sensory: Sensory testing is intact to soft touch on all 4 extremities. No evidence of extinction is noted.  Coordination: Cerebellar testing reveals good finger-nose-finger and heel-to-shin  bilaterally.  Gait and station: Gait is normal. Tandem gait is normal. Romberg is negative. No drift is seen.  Reflexes: Deep tendon reflexes are symmetric and normal bilaterally.   DIAGNOSTIC DATA (LABS, IMAGING, TESTING) - I reviewed patient records, labs, notes, testing and imaging myself where available.  No flowsheet data found.   Lab Results  Component Value Date   WBC 4.3 11/10/2019   HGB 12.7 11/10/2019   HCT 37.3 11/10/2019   MCV 93 11/10/2019   PLT 260 11/10/2019      Component Value Date/Time   NA 140 11/10/2019 0858   K 4.8 11/10/2019 0858   CL 104 11/10/2019 0858   CO2 23 11/10/2019 0858   GLUCOSE 95 11/10/2019 0858   GLUCOSE 108 (H) 01/21/2009 1330   BUN 9 11/10/2019 0858   CREATININE 0.72 11/10/2019 0858   CALCIUM 9.6 11/10/2019 0858   PROT 6.6 11/10/2019 0858   ALBUMIN 4.6 11/10/2019 0858   AST 14 11/10/2019 0858   ALT 11 11/10/2019 0858   ALKPHOS 37 (L) 11/10/2019 0858   BILITOT 0.5 11/10/2019 0858   GFRNONAA 117 11/10/2019 0858   GFRAA 135 11/10/2019 0858   No results found for: CHOL, HDL, LDLCALC, LDLDIRECT, TRIG, CHOLHDL No results found for: MGQQ7Y No results found for: VITAMINB12 Lab Results  Component Value Date   TSH 0.372 (L) 11/10/2019       ASSESSMENT AND PLAN 24 y.o. year old female  has a past medical history of Cystic fibrosis carrier, Depression, Eczema, and Incompetent cervix. here with ***  No diagnosis found.     No orders of the defined types were placed in this encounter.    No orders of the defined types were placed in this encounter.     I spent 15 minutes with the patient. 50% of this time was spent counseling and educating patient on plan of care and medications.    Shawnie Dapper, FNP-C 01/04/2020, 1:06 PM [facility] [address] [place], [place] [address] [phone]

## 2020-01-12 DIAGNOSIS — Z20828 Contact with and (suspected) exposure to other viral communicable diseases: Secondary | ICD-10-CM | POA: Diagnosis not present

## 2020-01-25 ENCOUNTER — Other Ambulatory Visit: Payer: Self-pay

## 2020-01-25 ENCOUNTER — Encounter: Payer: Self-pay | Admitting: Adult Health

## 2020-01-25 ENCOUNTER — Ambulatory Visit (INDEPENDENT_AMBULATORY_CARE_PROVIDER_SITE_OTHER): Payer: BC Managed Care – PPO | Admitting: Adult Health

## 2020-01-25 DIAGNOSIS — F331 Major depressive disorder, recurrent, moderate: Secondary | ICD-10-CM

## 2020-01-25 DIAGNOSIS — G4700 Insomnia, unspecified: Secondary | ICD-10-CM

## 2020-01-25 DIAGNOSIS — F319 Bipolar disorder, unspecified: Secondary | ICD-10-CM | POA: Diagnosis not present

## 2020-01-25 DIAGNOSIS — F411 Generalized anxiety disorder: Secondary | ICD-10-CM | POA: Diagnosis not present

## 2020-01-25 DIAGNOSIS — F4310 Post-traumatic stress disorder, unspecified: Secondary | ICD-10-CM

## 2020-01-25 MED ORDER — ARIPIPRAZOLE 10 MG PO TABS: ORAL_TABLET | ORAL | 2 refills | Status: DC

## 2020-01-25 MED ORDER — QUETIAPINE FUMARATE 50 MG PO TABS: ORAL_TABLET | ORAL | 2 refills | Status: DC

## 2020-01-25 NOTE — Progress Notes (Signed)
Nicole Morgan [id number] August 11, 1996 23 y.o.  Subjective:   Patient ID:  Nicole Morgan is a 24 y.o. (DOB 1995-10-16) female.  Chief Complaint: No chief complaint on file.   HPI Nicole Morgan presents to the office today for follow-up of MDD, GAD, insomnia, BPD, and PTSD.  Describes mood today as "ok". Pleasant. Denies tearfulness. Mood symptoms - reports decreased depression - it comes and goes. Reports feeling anxious "most of the time". Decreased irritability - "sometimes". Having less "outbursts". Not as irritable. Stating "not every little thing irritates me".  Stating "I feel better overall". Feels like increase in Abilify was helpful. Boyfriend has noticed a change and commented on her progress. Stating "I feel more normal". Taking medication as prescribed.  Energy levels better. Active, does not have a regular exercise routine. Works full-time at an assisted living facility. Enjoys some usual interests and activities. Single. Lives with boyfriend of 32 years and their 22 y/o son. Son in first grade. Painting. Spending time with family.  Appetite adequate. Weight stable "for now".  Sleep has improved. Averages 7 hours with addition of Seroquel. Focus and concentration "improved". Completing tasks. Managing aspects of household. Working full time as a Dispensing optician - "doing better in work setting".  Denies SI or HI. Denies AH or VH. Hears voices "sometimes".   Previous medication trials: Wellbutrin, Zoloft - induced mania  Review of Systems:  Review of Systems  Musculoskeletal: Negative for gait problem.  Neurological: Negative for tremors.  Psychiatric/Behavioral:       Please refer to HPI    Medications: I have reviewed the patient's current medications.  Current Outpatient Medications  Medication Sig Dispense Refill  . amitriptyline (ELAVIL) 10 MG tablet Take 1 tablet (10 mg total) by mouth at bedtime. 30 tablet 3  . ARIPiprazole (ABILIFY) 10 MG tablet Take one tablet daily.  30 tablet 2  . drospirenone-ethinyl estradiol (YAZ) 3-0.02 MG tablet Take 1 tablet by mouth daily.    Marland Kitchen gabapentin (NEURONTIN) 300 MG capsule Take 2 capsules (600 mg total) by mouth 3 (three) times daily. 180 capsule 6  . ibuprofen (ADVIL) 200 MG tablet Take 200 mg by mouth as needed.    . ondansetron (ZOFRAN-ODT) 4 MG disintegrating tablet Take 1 tablet (4 mg total) by mouth every 8 (eight) hours as needed for nausea. For nausea or migraine. May take with Rizatriptan. 30 tablet 3  . predniSONE (DELTASONE) 20 MG tablet Take 10 mg by mouth every other day.    Marland Kitchen QUEtiapine (SEROQUEL) 25 MG tablet Take one to two tablets at bedtime as needed for sleep. 60 tablet 2  . rizatriptan (MAXALT-MLT) 10 MG disintegrating tablet Take 1 tablet (10 mg total) by mouth as needed for migraine. May repeat in 2 hours if needed 9 tablet 11  . spironolactone (ALDACTONE) 100 MG tablet Take 100 mg by mouth daily.     No current facility-administered medications for this visit.    Medication Side Effects: None  Allergies: No Known Allergies  Past Medical History:  Diagnosis Date  . Cystic fibrosis carrier   . Depression    no meds  . Eczema   . Incompetent cervix     Family History  Problem Relation Age of Onset  . Bipolar disorder Mother        schizophrenic  . Mental illness Mother        schizophrenia  . Deep vein thrombosis Maternal Aunt   . Alcohol abuse Maternal Grandmother   .  Mental illness Maternal Grandmother   . Bipolar disorder Maternal Grandmother        schizophrenic   . Cancer Maternal Grandmother        lymph nodes  . Migraines Neg Hx   . Headache Neg Hx     Social History   Socioeconomic History  . Marital status: Single    Spouse name: Not on file  . Number of children: 1  . Years of education: Not on file  . Highest education level: Some college, no degree  Occupational History  . Not on file  Tobacco Use  . Smoking status: Former Smoker    Packs/day: 0.25    Years:  1.00    Pack years: 0.25    Types: Cigarettes    Quit date: 02/22/2012    Years since quitting: 7.9  . Smokeless tobacco: Never Used  Substance and Sexual Activity  . Alcohol use: Yes    Comment: 1-2 beers every now and then  . Drug use: No  . Sexual activity: Yes    Birth control/protection: Pill  Other Topics Concern  . Not on file  Social History Narrative   Lives with child    Right handed   Caffeine: about 3 cups/day maybe more   Social Determinants of Health   Financial Resource Strain:   . Difficulty of Paying Living Expenses:   Food Insecurity:   . Worried About Programme researcher, broadcasting/film/video in the Last Year:   . Barista in the Last Year:   Transportation Needs:   . Freight forwarder (Medical):   Marland Kitchen Lack of Transportation (Non-Medical):   Physical Activity:   . Days of Exercise per Week:   . Minutes of Exercise per Session:   Stress:   . Feeling of Stress :   Social Connections:   . Frequency of Communication with Friends and Family:   . Frequency of Social Gatherings with Friends and Family:   . Attends Religious Services:   . Active Member of Clubs or Organizations:   . Attends Banker Meetings:   Marland Kitchen Marital Status:   Intimate Partner Violence:   . Fear of Current or Ex-Partner:   . Emotionally Abused:   Marland Kitchen Physically Abused:   . Sexually Abused:     Past Medical History, Surgical history, Social history, and Family history were reviewed and updated as appropriate.   Please see review of systems for further details on the patient's review from today.   Objective:   Physical Exam:  There were no vitals taken for this visit.  Physical Exam Constitutional:      General: She is not in acute distress. Musculoskeletal:        General: No deformity.  Neurological:     Mental Status: She is alert and oriented to person, place, and time.     Coordination: Coordination normal.  Psychiatric:        Attention and Perception: Attention and  perception normal. She does not perceive auditory or visual hallucinations.        Mood and Affect: Mood is anxious and depressed. Affect is not labile, blunt, angry or inappropriate.        Speech: Speech normal.        Behavior: Behavior normal.        Thought Content: Thought content normal. Thought content is not paranoid or delusional. Thought content does not include homicidal or suicidal ideation. Thought content does not include homicidal or suicidal  plan.        Cognition and Memory: Cognition and memory normal.        Judgment: Judgment normal.     Comments: Insight intact     Lab Review:     Component Value Date/Time   NA 140 11/10/2019 0858   K 4.8 11/10/2019 0858   CL 104 11/10/2019 0858   CO2 23 11/10/2019 0858   GLUCOSE 95 11/10/2019 0858   GLUCOSE 108 (H) 01/21/2009 1330   BUN 9 11/10/2019 0858   CREATININE 0.72 11/10/2019 0858   CALCIUM 9.6 11/10/2019 0858   PROT 6.6 11/10/2019 0858   ALBUMIN 4.6 11/10/2019 0858   AST 14 11/10/2019 0858   ALT 11 11/10/2019 0858   ALKPHOS 37 (L) 11/10/2019 0858   BILITOT 0.5 11/10/2019 0858   GFRNONAA 117 11/10/2019 0858   GFRAA 135 11/10/2019 0858       Component Value Date/Time   WBC 4.3 11/10/2019 0858   WBC 14.5 (H) 10/17/2012 0550   RBC 4.02 11/10/2019 0858   RBC 2.98 (L) 10/17/2012 0550   HGB 12.7 11/10/2019 0858   HCT 37.3 11/10/2019 0858   PLT 260 11/10/2019 0858   MCV 93 11/10/2019 0858   MCH 31.6 11/10/2019 0858   MCH 28.2 10/17/2012 0550   MCHC 34.0 11/10/2019 0858   MCHC 33.3 10/17/2012 0550   RDW 12.4 11/10/2019 0858   LYMPHSABS 2.8 01/21/2009 2100   MONOABS 0.8 01/21/2009 2100   EOSABS 0.1 01/21/2009 2100   BASOSABS 0.1 01/21/2009 2100    No results found for: POCLITH, LITHIUM   No results found for: PHENYTOIN, PHENOBARB, VALPROATE, CBMZ   .res Assessment: Plan:    Plan:  PDMP reviewed  1. Abilify 10mg  daily 2. Increase Seroquel 50mg  - 1 to 2 sleep.  3. Gabapentin 600mg  TID   Avoid  SSRI d/t mania  Set up with for therapy  RTC 4/6 weeks  Patient advised to contact office with any questions, adverse effects, or acute worsening in signs and symptoms.  Discussed potential metabolic side effects associated with atypical antipsychotics, as well as potential risk for movement side effects. Advised pt to contact office if movement side effects occur.    There are no diagnoses linked to this encounter.   Please see After Visit Summary for patient specific instructions.  No future appointments.  No orders of the defined types were placed in this encounter.   -------------------------------

## 2020-02-22 ENCOUNTER — Ambulatory Visit: Payer: BC Managed Care – PPO | Admitting: Adult Health

## 2020-03-02 ENCOUNTER — Ambulatory Visit (INDEPENDENT_AMBULATORY_CARE_PROVIDER_SITE_OTHER): Payer: BC Managed Care – PPO | Admitting: Adult Health

## 2020-03-02 ENCOUNTER — Encounter: Payer: Self-pay | Admitting: Adult Health

## 2020-03-02 ENCOUNTER — Other Ambulatory Visit: Payer: Self-pay

## 2020-03-02 DIAGNOSIS — F331 Major depressive disorder, recurrent, moderate: Secondary | ICD-10-CM

## 2020-03-02 DIAGNOSIS — F319 Bipolar disorder, unspecified: Secondary | ICD-10-CM | POA: Diagnosis not present

## 2020-03-02 DIAGNOSIS — F411 Generalized anxiety disorder: Secondary | ICD-10-CM | POA: Diagnosis not present

## 2020-03-02 DIAGNOSIS — G4700 Insomnia, unspecified: Secondary | ICD-10-CM

## 2020-03-02 MED ORDER — ARIPIPRAZOLE 15 MG PO TABS: ORAL_TABLET | ORAL | 2 refills | Status: DC

## 2020-03-02 MED ORDER — QUETIAPINE FUMARATE 50 MG PO TABS: ORAL_TABLET | ORAL | 2 refills | Status: DC

## 2020-03-02 NOTE — Progress Notes (Signed)
Nicole Morgan [id number] 25-Dec-1995 23 y.o.  Subjective:   Patient ID:  Nicole Morgan is a 24 y.o. (DOB December 01, 1995) female.  Chief Complaint: No chief complaint on file.   HPI Nicole Morgan presents to the office today for follow-up of MDD, GAD, insomnia, BPD, and PTSD.  Describes mood today as "ok". Pleasant. Denies tearfulness. Mood symptoms - reports depression, anxiety, and irritability - "feeling kinda down and weighted". Increased stressors - financial. Going through her first financial "crisis". Doesn't feel like boyfriend is doing his part in the relationship. Has been "lashing out" at him more lately. Has been more "frustrated and angry". Getting through work days "better". Stating "I haven't had any issues there". Not letting things in the work setting "bother her". Has a good relationship with boss. Feels like the medications are helping her. Also stating "nothing is bad as it used to be" - "not flying off anymore". Grandmother feels like her medication needs to be increased with increased stressors. Taking medication as prescribed.  Energy levels "lower" lately with everything going on. Active, does not have a regular exercise routine. Works full-time at an assisted living facility. Enjoys some usual interests and activities. Single. Lives with boyfriend of 8 years and their 11 y/o son. Spending time with family.  Appetite adequate. Weight gain 5 pounds.  Sleep has improved. Averages 8 hours with Seroquel. Focus and concentration "alright". Completing tasks. Managing aspects of household. 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 Denies AH or VH. Hears voices "sometimes".   Previous medication trials: Wellbutrin - didn't help, Zoloft - induced mania  Review of Systems:  Review of Systems  Musculoskeletal: Negative for gait problem.  Neurological: Negative for tremors.  Psychiatric/Behavioral:       Please refer to HPI    Medications: I have reviewed the  patient's current medications.  Current Outpatient Medications  Medication Sig Dispense Refill  . amitriptyline (ELAVIL) 10 MG tablet Take 1 tablet (10 mg total) by mouth at bedtime. 30 tablet 3  . ARIPiprazole (ABILIFY) 15 MG tablet Take one tablet daily. 30 tablet 2  . drospirenone-ethinyl estradiol (YAZ) 3-0.02 MG tablet Take 1 tablet by mouth daily.    Marland Kitchen gabapentin (NEURONTIN) 300 MG capsule Take 2 capsules (600 mg total) by mouth 3 (three) times daily. 180 capsule 6  . ibuprofen (ADVIL) 200 MG tablet Take 200 mg by mouth as needed.    . ondansetron (ZOFRAN-ODT) 4 MG disintegrating tablet Take 1 tablet (4 mg total) by mouth every 8 (eight) hours as needed for nausea. For nausea or migraine. May take with Rizatriptan. 30 tablet 3  . predniSONE (DELTASONE) 20 MG tablet Take 10 mg by mouth every other day.    Marland Kitchen QUEtiapine (SEROQUEL) 50 MG tablet Take three tablets at bedtime as needed for sleep. 90 tablet 2  . rizatriptan (MAXALT-MLT) 10 MG disintegrating tablet Take 1 tablet (10 mg total) by mouth as needed for migraine. May repeat in 2 hours if needed 9 tablet 11  . spironolactone (ALDACTONE) 100 MG tablet Take 100 mg by mouth daily.     No current facility-administered medications for this visit.    Medication Side Effects: None  Allergies: No Known Allergies  Past Medical History:  Diagnosis Date  . Cystic fibrosis carrier   . Depression    no meds  . Eczema   . Incompetent cervix     Family History  Problem Relation Age of Onset  . Bipolar disorder  Mother        schizophrenic  . Mental illness Mother        schizophrenia  . Deep vein thrombosis Maternal Aunt   . Alcohol abuse Maternal Grandmother   . Mental illness Maternal Grandmother   . Bipolar disorder Maternal Grandmother        schizophrenic   . Cancer Maternal Grandmother        lymph nodes  . Migraines Neg Hx   . Headache Neg Hx     Social History   Socioeconomic History  . Marital status: Single     Spouse name: Not on file  . Number of children: 1  . Years of education: Not on file  . Highest education level: Some college, no degree  Occupational History  . Not on file  Tobacco Use  . Smoking status: Former Smoker    Packs/day: 0.25    Years: 1.00    Pack years: 0.25    Types: Cigarettes    Quit date: 02/22/2012    Years since quitting: 8.0  . Smokeless tobacco: Never Used  Vaping Use  . Vaping Use: Every day  . Substances: Nicotine, Flavoring  Substance and Sexual Activity  . Alcohol use: Yes    Comment: 1-2 beers every now and then  . Drug use: No  . Sexual activity: Yes    Birth control/protection: Pill  Other Topics Concern  . Not on file  Social History Narrative   Lives with child    Right handed   Caffeine: about 3 cups/day maybe more   Social Determinants of Health   Financial Resource Strain:   . Difficulty of Paying Living Expenses:   Food Insecurity:   . Worried About Charity fundraiser in the Last Year:   . Arboriculturist in the Last Year:   Transportation Needs:   . Film/video editor (Medical):   Marland Kitchen Lack of Transportation (Non-Medical):   Physical Activity:   . Days of Exercise per Week:   . Minutes of Exercise per Session:   Stress:   . Feeling of Stress :   Social Connections:   . Frequency of Communication with Friends and Family:   . Frequency of Social Gatherings with Friends and Family:   . Attends Religious Services:   . Active Member of Clubs or Organizations:   . Attends Archivist Meetings:   Marland Kitchen Marital Status:   Intimate Partner Violence:   . Fear of Current or Ex-Partner:   . Emotionally Abused:   Marland Kitchen Physically Abused:   . Sexually Abused:     Past Medical History, Surgical history, Social history, and Family history were reviewed and updated as appropriate.   Please see review of systems for further details on the patient's review from today.   Objective:   Physical Exam:  There were no vitals taken for  this visit.  Physical Exam Constitutional:      General: She is not in acute distress. Musculoskeletal:        General: No deformity.  Neurological:     Mental Status: She is alert and oriented to person, place, and time.     Coordination: Coordination normal.  Psychiatric:        Attention and Perception: Attention and perception normal. She does not perceive auditory or visual hallucinations.        Mood and Affect: Mood normal. Mood is not anxious or depressed. Affect is not labile, blunt, angry or inappropriate.  Speech: Speech normal.        Behavior: Behavior normal.        Thought Content: Thought content normal. Thought content is not paranoid or delusional. Thought content does not include homicidal or suicidal ideation. Thought content does not include homicidal or suicidal plan.        Cognition and Memory: Cognition and memory normal.        Judgment: Judgment normal.     Comments: Insight intact     Lab Review:     Component Value Date/Time   NA 140 11/10/2019 0858   K 4.8 11/10/2019 0858   CL 104 11/10/2019 0858   CO2 23 11/10/2019 0858   GLUCOSE 95 11/10/2019 0858   GLUCOSE 108 (H) 01/21/2009 1330   BUN 9 11/10/2019 0858   CREATININE 0.72 11/10/2019 0858   CALCIUM 9.6 11/10/2019 0858   PROT 6.6 11/10/2019 0858   ALBUMIN 4.6 11/10/2019 0858   AST 14 11/10/2019 0858   ALT 11 11/10/2019 0858   ALKPHOS 37 (L) 11/10/2019 0858   BILITOT 0.5 11/10/2019 0858   GFRNONAA 117 11/10/2019 0858   GFRAA 135 11/10/2019 0858       Component Value Date/Time   WBC 4.3 11/10/2019 0858   WBC 14.5 (H) 10/17/2012 0550   RBC 4.02 11/10/2019 0858   RBC 2.98 (L) 10/17/2012 0550   HGB 12.7 11/10/2019 0858   HCT 37.3 11/10/2019 0858   PLT 260 11/10/2019 0858   MCV 93 11/10/2019 0858   MCH 31.6 11/10/2019 0858   MCH 28.2 10/17/2012 0550   MCHC 34.0 11/10/2019 0858   MCHC 33.3 10/17/2012 0550   RDW 12.4 11/10/2019 0858   LYMPHSABS 2.8 01/21/2009 2100   MONOABS 0.8  01/21/2009 2100   EOSABS 0.1 01/21/2009 2100   BASOSABS 0.1 01/21/2009 2100    No results found for: POCLITH, LITHIUM   No results found for: PHENYTOIN, PHENOBARB, VALPROATE, CBMZ   .res Assessment: Plan:    Plan:  PDMP reviewed  1. Increase Abilify 10mg  to 15mg  daily 2. Seroquel 150mg  at hs  3. Gabapentin 600mg  TID   Avoid SSRI d/t mania  Set up with  RTC 4/6 weeks  Patient advised to contact office with any questions, adverse effects, or acute worsening in signs and symptoms.  Discussed potential metabolic side effects associated with atypical antipsychotics, as well as potential risk for movement side effects. Advised pt to contact office if movement side effects occur.    Diagnoses and all orders for this visit:  Major depressive disorder, recurrent episode, moderate (HCC) -     ARIPiprazole (ABILIFY) 15 MG tablet; Take one tablet daily.  Generalized anxiety disorder -     ARIPiprazole (ABILIFY) 15 MG tablet; Take one tablet daily.  Insomnia, unspecified type -     ARIPiprazole (ABILIFY) 15 MG tablet; Take one tablet daily. -     QUEtiapine (SEROQUEL) 50 MG tablet; Take three tablets at bedtime as needed for sleep.  Bipolar I disorder (HCC) -     ARIPiprazole (ABILIFY) 15 MG tablet; Take one tablet daily. -     QUEtiapine (SEROQUEL) 50 MG tablet; Take three tablets at bedtime as needed for sleep.     Please see After Visit Summary for patient specific instructions.  Future Appointments  Date Time Provider Department Center  03/30/2020  4:20 PM Gussie Towson, , NP [facility] None    No orders of the defined types were placed in this encounter.   -------------------------------

## 2020-03-26 ENCOUNTER — Other Ambulatory Visit: Payer: Self-pay | Admitting: Neurology

## 2020-03-30 ENCOUNTER — Ambulatory Visit: Payer: BC Managed Care – PPO | Admitting: Adult Health

## 2020-04-27 ENCOUNTER — Other Ambulatory Visit: Payer: Self-pay | Admitting: Adult Health

## 2020-04-27 ENCOUNTER — Other Ambulatory Visit: Payer: Self-pay | Admitting: Neurology

## 2020-04-27 DIAGNOSIS — G4700 Insomnia, unspecified: Secondary | ICD-10-CM

## 2020-04-27 DIAGNOSIS — F319 Bipolar disorder, unspecified: Secondary | ICD-10-CM

## 2020-05-27 ENCOUNTER — Other Ambulatory Visit: Payer: Self-pay | Admitting: Neurology

## 2020-06-01 DIAGNOSIS — Z79899 Other long term (current) drug therapy: Secondary | ICD-10-CM | POA: Diagnosis not present

## 2020-06-01 DIAGNOSIS — R202 Paresthesia of skin: Secondary | ICD-10-CM | POA: Diagnosis not present

## 2020-06-01 DIAGNOSIS — R946 Abnormal results of thyroid function studies: Secondary | ICD-10-CM | POA: Diagnosis not present

## 2020-06-01 DIAGNOSIS — G43711 Chronic migraine without aura, intractable, with status migrainosus: Secondary | ICD-10-CM | POA: Diagnosis not present

## 2020-06-21 ENCOUNTER — Other Ambulatory Visit: Payer: Self-pay

## 2020-06-21 ENCOUNTER — Ambulatory Visit (INDEPENDENT_AMBULATORY_CARE_PROVIDER_SITE_OTHER): Payer: BC Managed Care – PPO | Admitting: Adult Health

## 2020-06-21 ENCOUNTER — Encounter: Payer: Self-pay | Admitting: Adult Health

## 2020-06-21 DIAGNOSIS — F331 Major depressive disorder, recurrent, moderate: Secondary | ICD-10-CM

## 2020-06-21 DIAGNOSIS — F411 Generalized anxiety disorder: Secondary | ICD-10-CM

## 2020-06-21 DIAGNOSIS — G4700 Insomnia, unspecified: Secondary | ICD-10-CM

## 2020-06-21 DIAGNOSIS — F319 Bipolar disorder, unspecified: Secondary | ICD-10-CM

## 2020-06-21 MED ORDER — ARIPIPRAZOLE 15 MG PO TABS: ORAL_TABLET | ORAL | 2 refills | Status: DC

## 2020-06-21 MED ORDER — QUETIAPINE FUMARATE 50 MG PO TABS: ORAL_TABLET | ORAL | 2 refills | Status: DC

## 2020-06-21 MED ORDER — CLORAZEPATE DIPOTASSIUM 3.75 MG PO TABS: 3.7500 mg | ORAL_TABLET | Freq: Two times a day (BID) | ORAL | 3 refills | Status: DC | PRN

## 2020-06-21 NOTE — Progress Notes (Signed)
Nicole Morgan [id number] 1996-01-14 24 y.o.  Subjective:   Patient ID:  Nicole Morgan is a 25 y.o. (DOB Feb 18, 1996) female.  Chief Complaint: No chief complaint on file.   HPI Bular P Bufkin presents to the office today for follow-up of MDD, GAD, insomnia, BPD, and PTSD.  Describes mood today as "ok". Pleasant. Denies tearfulness. Mood symptoms - reports decreased depression and irritability. More anxious overall. Having panic attacks. Stating "they come out of nowhere". Reports a few panic attacks at work but has been able to stay and complete her shift. Mostly happening while at home. Has been passing out when getting shots. Not "lashing" out like she was. Decreased financial stressors. She and boyfriend are doing good. Taking medication as prescribed.  Energy levels improved. Active, does not have a regular exercise routine. Works full-time at an assisted living facility. Enjoys some usual interests and activities. Single. Lives with boyfriend of 8 years and their 60 y/o son. Spending time with family.  Appetite adequate. Weight stable - 107 pounds. Sleep has improved. Averages 8 hours with Seroquel. Focus and concentration "ok". Completing tasks. Managing aspects of household. 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 Denies AH or VH. Hears voices "sometimes".   Previous medication trials: Wellbutrin - didn't help, Zoloft - induced mania    Review of Systems:  Review of Systems  Medications: I have reviewed the patient's current medications.  Current Outpatient Medications  Medication Sig Dispense Refill   amitriptyline (ELAVIL) 10 MG tablet TAKE ONE TABLET BY MOUTH AT BEDTIME **APPOINTMENT NEEDED FOR FURTHER REFILLS* 30 tablet 0   ARIPiprazole (ABILIFY) 15 MG tablet Take one tablet daily. 30 tablet 2   clorazepate (TRANXENE) 3.75 MG tablet Take 1 tablet (3.75 mg total) by mouth 2 (two) times daily as needed for anxiety. 30 tablet 3   drospirenone-ethinyl  estradiol (YAZ) 3-0.02 MG tablet Take 1 tablet by mouth daily.     gabapentin (NEURONTIN) 300 MG capsule Take 2 capsules (600 mg total) by mouth 3 (three) times daily. 180 capsule 6   ibuprofen (ADVIL) 200 MG tablet Take 200 mg by mouth as needed.     ondansetron (ZOFRAN-ODT) 4 MG disintegrating tablet Take 1 tablet (4 mg total) by mouth every 8 (eight) hours as needed for nausea. For nausea or migraine. May take with Rizatriptan. 30 tablet 3   predniSONE (DELTASONE) 20 MG tablet Take 10 mg by mouth every other day.     QUEtiapine (SEROQUEL) 50 MG tablet Take three tablets at bedtime as needed for sleep. 90 tablet 2   rizatriptan (MAXALT-MLT) 10 MG disintegrating tablet Take 1 tablet (10 mg total) by mouth as needed for migraine. May repeat in 2 hours if needed 9 tablet 11   spironolactone (ALDACTONE) 100 MG tablet Take 100 mg by mouth daily.     No current facility-administered medications for this visit.    Medication Side Effects: None  Allergies: No Known Allergies  Past Medical History:  Diagnosis Date   Cystic fibrosis carrier    Depression    no meds   Eczema    Incompetent cervix     Family History  Problem Relation Age of Onset   Bipolar disorder Mother        schizophrenic   Mental illness Mother        schizophrenia   Deep vein thrombosis Maternal Aunt    Alcohol abuse Maternal Grandmother    Mental illness Maternal Grandmother  Bipolar disorder Maternal Grandmother        schizophrenic    Cancer Maternal Grandmother        lymph nodes   Migraines Neg Hx    Headache Neg Hx     Social History   Socioeconomic History   Marital status: Single    Spouse name: Not on file   Number of children: 1   Years of education: Not on file   Highest education level: Some college, no degree  Occupational History   Not on file  Tobacco Use   Smoking status: Former Smoker    Packs/day: 0.25    Years: 1.00    Pack years: 0.25    Types:  Cigarettes    Quit date: 02/22/2012    Years since quitting: 8.3   Smokeless tobacco: Never Used  Vaping Use   Vaping Use: Every day   Substances: Nicotine, Flavoring  Substance and Sexual Activity   Alcohol use: Yes    Comment: 1-2 beers every now and then   Drug use: No   Sexual activity: Yes    Birth control/protection: Pill  Other Topics Concern   Not on file  Social History Narrative   Lives with child    Right handed   Caffeine: about 3 cups/day maybe more   Social Determinants of Health   Financial Resource Strain:    Difficulty of Paying Living Expenses: Not on file  Food Insecurity:    Worried About Programme researcher, broadcasting/film/video in the Last Year: Not on file   [employer] of Food in the Last Year: Not on file  Transportation Needs:    Lack of Transportation (Medical): Not on file   Lack of Transportation (Non-Medical): Not on file  Physical Activity:    Days of Exercise per Week: Not on file   Minutes of Exercise per Session: Not on file  Stress:    Feeling of Stress : Not on file  Social Connections:    Frequency of Communication with Friends and Family: Not on file   Frequency of Social Gatherings with Friends and Family: Not on file   Attends Religious Services: Not on file   Active Member of Clubs or Organizations: Not on file   Attends Banker Meetings: Not on file   Marital Status: Not on file  Intimate Partner Violence:    Fear of Current or Ex-Partner: Not on file   Emotionally Abused: Not on file   Physically Abused: Not on file   Sexually Abused: Not on file    Past Medical History, Surgical history, Social history, and Family history were reviewed and updated as appropriate.   Please see review of systems for further details on the patient's review from today.   Objective:   Physical Exam:  There were no vitals taken for this visit.  Physical Exam  Lab Review:     Component Value Date/Time   NA 140 11/10/2019  0858   K 4.8 11/10/2019 0858   CL 104 11/10/2019 0858   CO2 23 11/10/2019 0858   GLUCOSE 95 11/10/2019 0858   GLUCOSE 108 (H) 01/21/2009 1330   BUN 9 11/10/2019 0858   CREATININE 0.72 11/10/2019 0858   CALCIUM 9.6 11/10/2019 0858   PROT 6.6 11/10/2019 0858   ALBUMIN 4.6 11/10/2019 0858   AST 14 11/10/2019 0858   ALT 11 11/10/2019 0858   ALKPHOS 37 (L) 11/10/2019 0858   BILITOT 0.5 11/10/2019 0858   GFRNONAA 117 11/10/2019 9702  GFRAA 135 11/10/2019 0858       Component Value Date/Time   WBC 4.3 11/10/2019 0858   WBC 14.5 (H) 10/17/2012 0550   RBC 4.02 11/10/2019 0858   RBC 2.98 (L) 10/17/2012 0550   HGB 12.7 11/10/2019 0858   HCT 37.3 11/10/2019 0858   PLT 260 11/10/2019 0858   MCV 93 11/10/2019 0858   MCH 31.6 11/10/2019 0858   MCH 28.2 10/17/2012 0550   MCHC 34.0 11/10/2019 0858   MCHC 33.3 10/17/2012 0550   RDW 12.4 11/10/2019 0858   LYMPHSABS 2.8 01/21/2009 2100   MONOABS 0.8 01/21/2009 2100   EOSABS 0.1 01/21/2009 2100   BASOSABS 0.1 01/21/2009 2100    No results found for: POCLITH, LITHIUM   No results found for: PHENYTOIN, PHENOBARB, VALPROATE, CBMZ   .res Assessment: Plan:    Plan:  PDMP reviewed  1. Abilify 15mg  daily 2. Seroquel 150mg  at hs  3. Gabapentin 600mg  TID  4. Add Tranxene 3.75 BID for anxiety and panic attacks  Avoid SSRI d/t mania  Set up with  RTC 4/6 weeks  Patient advised to contact office with any questions, adverse effects, or acute worsening in signs and symptoms.  Discussed potential metabolic side effects associated with atypical antipsychotics, as well as potential risk for movement side effects. Advised pt to contact office if movement side effects occur.     Diagnoses and all orders for this visit:  Major depressive disorder, recurrent episode, moderate (HCC) -     ARIPiprazole (ABILIFY) 15 MG tablet; Take one tablet daily.  Generalized anxiety disorder -     ARIPiprazole (ABILIFY) 15 MG tablet;  Take one tablet daily. -     clorazepate (TRANXENE) 3.75 MG tablet; Take 1 tablet (3.75 mg total) by mouth 2 (two) times daily as needed for anxiety.  Insomnia, unspecified type -     ARIPiprazole (ABILIFY) 15 MG tablet; Take one tablet daily. -     QUEtiapine (SEROQUEL) 50 MG tablet; Take three tablets at bedtime as needed for sleep.  Bipolar I disorder (HCC) -     ARIPiprazole (ABILIFY) 15 MG tablet; Take one tablet daily. -     QUEtiapine (SEROQUEL) 50 MG tablet; Take three tablets at bedtime as needed for sleep.     Please see After Visit Summary for patient specific instructions.  No future appointments.  No orders of the defined types were placed in this encounter.   -------------------------------

## 2020-06-26 ENCOUNTER — Other Ambulatory Visit: Payer: Self-pay | Admitting: Neurology

## 2020-06-27 DIAGNOSIS — Z20828 Contact with and (suspected) exposure to other viral communicable diseases: Secondary | ICD-10-CM | POA: Diagnosis not present

## 2020-06-27 DIAGNOSIS — B349 Viral infection, unspecified: Secondary | ICD-10-CM | POA: Diagnosis not present

## 2020-07-12 ENCOUNTER — Telehealth: Payer: Self-pay | Admitting: Adult Health

## 2020-07-12 DIAGNOSIS — F411 Generalized anxiety disorder: Secondary | ICD-10-CM

## 2020-07-12 NOTE — Telephone Encounter (Signed)
Patient reports tranxene is helpful but doesn't last long enough and she never knows when her anxiety will come on and wants to take something before it does. She had a lot of anxiety/panic at work and ended up having to leave work early because of it.  Informed her I would update Nicole Morgan and call back with recommendation.

## 2020-07-12 NOTE — Telephone Encounter (Signed)
Does she want to increase dose or change the medication to Ativan.

## 2020-07-12 NOTE — Telephone Encounter (Signed)
Pt left message that her anxiety meds are not working, and she is having to use it more often. Please call.

## 2020-07-13 MED ORDER — CLORAZEPATE DIPOTASSIUM 3.75 MG PO TABS: 3.7500 mg | ORAL_TABLET | Freq: Three times a day (TID) | ORAL | 2 refills | Status: DC | PRN

## 2020-07-13 NOTE — Telephone Encounter (Signed)
Reviewed thank you 

## 2020-07-13 NOTE — Telephone Encounter (Signed)
Patient reports it does help her, she thinking perhaps increasing the frequency in case needing more throughout the day?

## 2020-07-13 NOTE — Telephone Encounter (Signed)
Script sent to increase to TID.

## 2020-07-14 ENCOUNTER — Telehealth: Payer: Self-pay | Admitting: Adult Health

## 2020-07-14 NOTE — Telephone Encounter (Signed)
Patient aware.

## 2020-07-14 NOTE — Telephone Encounter (Signed)
Patient's next appt is 08/08/20. She needs a refill on Clorazepate called to [facility] on [address]

## 2020-07-14 NOTE — Telephone Encounter (Signed)
Left detailed message that it is okay for her to fill today, 07/14/2020 she had a dose increase to tid. I did call in Rx again as well. I kept trying to speak to a live person but it kept going around to the recording. Advised any questions to contact us back

## 2020-07-14 NOTE — Telephone Encounter (Signed)
This Rx was sent in yesterday with an increase in dose. Patient aware

## 2020-07-14 NOTE — Telephone Encounter (Signed)
Patient called about the Clorazepate called in yesterday. The pharmacy told her that they can't fill it because it is too early to fill. Not sure why they are saying this but could they be called to clarify its not too early?

## 2020-08-08 ENCOUNTER — Ambulatory Visit: Payer: BC Managed Care – PPO | Admitting: Adult Health

## 2020-08-10 ENCOUNTER — Ambulatory Visit: Payer: BC Managed Care – PPO | Admitting: Adult Health

## 2020-09-13 DIAGNOSIS — Z1151 Encounter for screening for human papillomavirus (HPV): Secondary | ICD-10-CM | POA: Diagnosis not present

## 2020-09-13 DIAGNOSIS — N898 Other specified noninflammatory disorders of vagina: Secondary | ICD-10-CM | POA: Diagnosis not present

## 2020-09-13 DIAGNOSIS — Z124 Encounter for screening for malignant neoplasm of cervix: Secondary | ICD-10-CM | POA: Diagnosis not present

## 2020-09-13 DIAGNOSIS — Z130 Encounter for screening for diseases of the blood and blood-forming organs and certain disorders involving the immune mechanism: Secondary | ICD-10-CM | POA: Diagnosis not present

## 2020-09-13 DIAGNOSIS — R87810 Cervical high risk human papillomavirus (HPV) DNA test positive: Secondary | ICD-10-CM | POA: Diagnosis not present

## 2020-09-13 DIAGNOSIS — Z113 Encounter for screening for infections with a predominantly sexual mode of transmission: Secondary | ICD-10-CM | POA: Diagnosis not present

## 2020-09-13 DIAGNOSIS — Z3041 Encounter for surveillance of contraceptive pills: Secondary | ICD-10-CM | POA: Diagnosis not present

## 2020-09-13 DIAGNOSIS — Z01419 Encounter for gynecological examination (general) (routine) without abnormal findings: Secondary | ICD-10-CM | POA: Diagnosis not present

## 2020-10-06 DIAGNOSIS — J020 Streptococcal pharyngitis: Secondary | ICD-10-CM | POA: Diagnosis not present

## 2020-10-06 DIAGNOSIS — J029 Acute pharyngitis, unspecified: Secondary | ICD-10-CM | POA: Diagnosis not present

## 2020-12-07 ENCOUNTER — Other Ambulatory Visit: Payer: Self-pay | Admitting: Adult Health

## 2020-12-07 DIAGNOSIS — F411 Generalized anxiety disorder: Secondary | ICD-10-CM

## 2020-12-07 DIAGNOSIS — F319 Bipolar disorder, unspecified: Secondary | ICD-10-CM

## 2020-12-07 DIAGNOSIS — F331 Major depressive disorder, recurrent, moderate: Secondary | ICD-10-CM

## 2020-12-07 DIAGNOSIS — G4700 Insomnia, unspecified: Secondary | ICD-10-CM

## 2020-12-08 NOTE — Telephone Encounter (Signed)
Rayshawn called and made appt for 12/26/20.  And corrected her address.  She will be out of the Abilify today.  Please send refill to [facility]. [facility] on [address]

## 2020-12-26 ENCOUNTER — Ambulatory Visit: Payer: BC Managed Care – PPO | Admitting: Adult Health

## 2021-01-31 ENCOUNTER — Other Ambulatory Visit: Payer: Self-pay

## 2021-01-31 ENCOUNTER — Emergency Department (HOSPITAL_BASED_OUTPATIENT_CLINIC_OR_DEPARTMENT_OTHER): Payer: Self-pay

## 2021-01-31 ENCOUNTER — Encounter (HOSPITAL_BASED_OUTPATIENT_CLINIC_OR_DEPARTMENT_OTHER): Payer: Self-pay | Admitting: Emergency Medicine

## 2021-01-31 ENCOUNTER — Emergency Department (HOSPITAL_BASED_OUTPATIENT_CLINIC_OR_DEPARTMENT_OTHER)
Admission: EM | Admit: 2021-01-31 | Discharge: 2021-01-31 | Disposition: A | Discharge status: Home / Self Care | Payer: Self-pay | Attending: Emergency Medicine | Admitting: Emergency Medicine

## 2021-01-31 DIAGNOSIS — Z87891 Personal history of nicotine dependence: Secondary | ICD-10-CM | POA: Insufficient documentation

## 2021-01-31 DIAGNOSIS — M25512 Pain in left shoulder: Secondary | ICD-10-CM | POA: Insufficient documentation

## 2021-01-31 DIAGNOSIS — R0789 Other chest pain: Secondary | ICD-10-CM | POA: Diagnosis present

## 2021-01-31 DIAGNOSIS — T7411XA Adult physical abuse, confirmed, initial encounter: Secondary | ICD-10-CM | POA: Diagnosis not present

## 2021-01-31 MED ORDER — LIDOCAINE 5 % EX PTCH
1.0000 | MEDICATED_PATCH | CUTANEOUS | 0 refills | Status: DC
Start: 2021-01-31 — End: 2022-04-01

## 2021-01-31 MED ORDER — LIDOCAINE 5 % EX PTCH: 1.0000 | MEDICATED_PATCH | CUTANEOUS | Status: DC

## 2021-01-31 MED ORDER — NAPROXEN 250 MG PO TABS
500.0000 mg | ORAL_TABLET | Freq: Once | ORAL | Status: AC
  Administered 2021-01-31: 500 mg via ORAL
  Filled 2021-01-31: qty 2

## 2021-01-31 MED ORDER — DICLOFENAC SODIUM ER 100 MG PO TB24: 100.0000 mg | ORAL_TABLET | Freq: Every day | ORAL | 0 refills | Status: DC

## 2021-01-31 MED ORDER — LIDOCAINE 5 % EX PTCH
3.0000 | MEDICATED_PATCH | CUTANEOUS | Status: DC
  Administered 2021-01-31: 3 via TRANSDERMAL
  Filled 2021-01-31: qty 3

## 2021-01-31 MED ORDER — ACETAMINOPHEN 500 MG PO TABS
1000.0000 mg | ORAL_TABLET | Freq: Once | ORAL | Status: AC
  Administered 2021-01-31: 1000 mg via ORAL
  Filled 2021-01-31: qty 2
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
## 2021-03-01 ENCOUNTER — Telehealth: Payer: Self-pay | Admitting: Adult Health

## 2021-03-01 ENCOUNTER — Other Ambulatory Visit: Payer: Self-pay

## 2021-03-01 DIAGNOSIS — F331 Major depressive disorder, recurrent, moderate: Secondary | ICD-10-CM

## 2021-03-01 DIAGNOSIS — F319 Bipolar disorder, unspecified: Secondary | ICD-10-CM

## 2021-03-01 DIAGNOSIS — G4700 Insomnia, unspecified: Secondary | ICD-10-CM

## 2021-03-01 DIAGNOSIS — F411 Generalized anxiety disorder: Secondary | ICD-10-CM

## 2021-03-01 MED ORDER — ARIPIPRAZOLE 15 MG PO TABS: ORAL_TABLET | ORAL | 0 refills | Status: DC

## 2021-03-01 NOTE — Telephone Encounter (Signed)
Rx sent 

## 2021-03-01 NOTE — Telephone Encounter (Signed)
Pt called in for refill for Abilify 15mg . States she is out. Appt 6/14. Pharmacy 7/14 [address] [place], [place]

## 2021-03-02 ENCOUNTER — Other Ambulatory Visit: Payer: Self-pay | Admitting: Adult Health

## 2021-03-02 DIAGNOSIS — F411 Generalized anxiety disorder: Secondary | ICD-10-CM

## 2021-03-05 ENCOUNTER — Other Ambulatory Visit: Payer: Self-pay | Admitting: Neurology

## 2021-03-05 NOTE — Telephone Encounter (Signed)
Has apt in the morning 6/14

## 2021-03-06 ENCOUNTER — Ambulatory Visit: Payer: BC Managed Care – PPO | Admitting: Adult Health

## 2021-04-07 ENCOUNTER — Encounter: Payer: Self-pay | Admitting: Emergency Medicine

## 2021-04-07 ENCOUNTER — Other Ambulatory Visit: Payer: Self-pay

## 2021-04-07 ENCOUNTER — Emergency Department: Payer: Self-pay

## 2021-04-07 ENCOUNTER — Emergency Department
Admission: EM | Admit: 2021-04-07 | Discharge: 2021-04-08 | Disposition: A | Discharge status: Home / Self Care | Payer: Self-pay | Attending: Emergency Medicine | Admitting: Emergency Medicine

## 2021-04-07 DIAGNOSIS — Y999 Unspecified external cause status: Secondary | ICD-10-CM | POA: Insufficient documentation

## 2021-04-07 DIAGNOSIS — F250 Schizoaffective disorder, bipolar type: Secondary | ICD-10-CM | POA: Insufficient documentation

## 2021-04-07 DIAGNOSIS — S2232XA Fracture of one rib, left side, initial encounter for closed fracture: Secondary | ICD-10-CM | POA: Insufficient documentation

## 2021-04-07 DIAGNOSIS — X58XXXA Exposure to other specified factors, initial encounter: Secondary | ICD-10-CM | POA: Insufficient documentation

## 2021-04-07 DIAGNOSIS — F319 Bipolar disorder, unspecified: Secondary | ICD-10-CM

## 2021-04-07 DIAGNOSIS — Z79899 Other long term (current) drug therapy: Secondary | ICD-10-CM | POA: Insufficient documentation

## 2021-04-07 DIAGNOSIS — S5000XA Contusion of unspecified elbow, initial encounter: Secondary | ICD-10-CM | POA: Insufficient documentation

## 2021-04-07 DIAGNOSIS — Y939 Activity, unspecified: Secondary | ICD-10-CM | POA: Insufficient documentation

## 2021-04-07 DIAGNOSIS — Y929 Unspecified place or not applicable: Secondary | ICD-10-CM | POA: Insufficient documentation

## 2021-04-07 DIAGNOSIS — F1910 Other psychoactive substance abuse, uncomplicated: Secondary | ICD-10-CM

## 2021-04-07 DIAGNOSIS — Z87898 Personal history of other specified conditions: Secondary | ICD-10-CM

## 2021-04-07 DIAGNOSIS — Z87891 Personal history of nicotine dependence: Secondary | ICD-10-CM | POA: Insufficient documentation

## 2021-04-07 DIAGNOSIS — F1920 Other psychoactive substance dependence, uncomplicated: Secondary | ICD-10-CM | POA: Insufficient documentation

## 2021-04-07 DIAGNOSIS — T07XXXA Unspecified multiple injuries, initial encounter: Secondary | ICD-10-CM

## 2021-04-07 DIAGNOSIS — F329 Major depressive disorder, single episode, unspecified: Secondary | ICD-10-CM | POA: Insufficient documentation

## 2021-04-07 LAB — URINE DRUG SCREEN, QUALITATIVE (ARMC ONLY)
Amphetamines, Ur Screen: NOT DETECTED
Barbiturates, Ur Screen: NOT DETECTED
Benzodiazepine, Ur Scrn: POSITIVE — AB
Cannabinoid 50 Ng, Ur ~~LOC~~: POSITIVE — AB
Cocaine Metabolite,Ur ~~LOC~~: POSITIVE — AB
MDMA (Ecstasy)Ur Screen: NOT DETECTED
Methadone Scn, Ur: NOT DETECTED
Opiate, Ur Screen: NOT DETECTED
Phencyclidine (PCP) Ur S: NOT DETECTED
Tricyclic, Ur Screen: POSITIVE — AB

## 2021-04-07 LAB — CBC
HCT: 41.6 % (ref 36.0–46.0)
Hemoglobin: 13.9 g/dL (ref 12.0–15.0)
MCH: 29.3 pg (ref 26.0–34.0)
MCHC: 33.4 g/dL (ref 30.0–36.0)
MCV: 87.8 fL (ref 80.0–100.0)
Platelets: 396 10*3/uL (ref 150–400)
RBC: 4.74 MIL/uL (ref 3.87–5.11)
RDW: 13.2 % (ref 11.5–15.5)
WBC: 13.4 10*3/uL — ABNORMAL HIGH (ref 4.0–10.5)
nRBC: 0 % (ref 0.0–0.2)

## 2021-04-07 LAB — COMPREHENSIVE METABOLIC PANEL
ALT: 22 U/L (ref 0–44)
AST: 39 U/L (ref 15–41)
Albumin: 4.1 g/dL (ref 3.5–5.0)
Alkaline Phosphatase: 72 U/L (ref 38–126)
Anion gap: 7 (ref 5–15)
BUN: 7 mg/dL (ref 6–20)
CO2: 28 mmol/L (ref 22–32)
Calcium: 9 mg/dL (ref 8.9–10.3)
Chloride: 102 mmol/L (ref 98–111)
Creatinine, Ser: 0.91 mg/dL (ref 0.44–1.00)
GFR, Estimated: >60 mL/min (ref >60)
Glucose, Bld: 107 mg/dL — ABNORMAL HIGH (ref 70–99)
Potassium: 3.2 mmol/L — ABNORMAL LOW (ref 3.5–5.1)
Sodium: 137 mmol/L (ref 135–145)
Total Bilirubin: 0.7 mg/dL (ref 0.3–1.2)
Total Protein: 7.8 g/dL (ref 6.5–8.1)

## 2021-04-07 LAB — PREGNANCY, URINE: Preg Test, Ur: NEGATIVE

## 2021-04-07 LAB — ETHANOL: Alcohol, Ethyl (B): <10 mg/dL (ref <10)

## 2021-04-07 MED ORDER — IBUPROFEN 600 MG PO TABS
600.0000 mg | ORAL_TABLET | ORAL | Status: AC
  Administered 2021-04-07: 600 mg via ORAL
  Filled 2021-04-07: qty 1

## 2021-04-07 MED ORDER — ACETAMINOPHEN 325 MG PO TABS
650.0000 mg | ORAL_TABLET | Freq: Once | ORAL | Status: AC
  Administered 2021-04-07: 650 mg via ORAL
  Filled 2021-04-07: qty 2

## 2021-04-07 MED ORDER — POTASSIUM CHLORIDE CRYS ER 20 MEQ PO TBCR
40.0000 meq | EXTENDED_RELEASE_TABLET | Freq: Once | ORAL | Status: AC
  Administered 2021-04-07: 40 meq via ORAL
  Filled 2021-04-07: qty 2

## 2021-04-07 MED ORDER — LORAZEPAM 2 MG PO TABS
2.0000 mg | ORAL_TABLET | Freq: Once | ORAL | Status: DC
  Filled 2021-04-07: qty 1

## 2021-04-07 NOTE — ED Notes (Signed)
Report given to Emily RN

## 2021-04-07 NOTE — ED Triage Notes (Signed)
1 shiny gray necklace with 2 rings, 1 shiny gray ring, 1 ring with black/shiny yellow/shiny grey band 1 shiny yellow necklace 3 shiny yellow rings, 1 ring with white stone, 1 ring with red shiny stone,  1 shiny yellow bracelet 1 belly button ring 1 pair clear plastic gauges 1 pair sunglasses 1 black scrunchie 1 pair light brown leather slide ons 1 pair ripped blue jeans 1 white tank top 1 cell phone 1 baggie with personal ID cards
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
## 2021-04-08 DIAGNOSIS — F1910 Other psychoactive substance abuse, uncomplicated: Secondary | ICD-10-CM

## 2021-04-08 DIAGNOSIS — F319 Bipolar disorder, unspecified: Secondary | ICD-10-CM

## 2021-04-08 DIAGNOSIS — Z87898 Personal history of other specified conditions: Secondary | ICD-10-CM

## 2021-04-08 DIAGNOSIS — S2232XA Fracture of one rib, left side, initial encounter for closed fracture: Secondary | ICD-10-CM | POA: Insufficient documentation

## 2021-04-08 MED ORDER — HALOPERIDOL LACTATE 5 MG/ML IJ SOLN: 5.0000 mg | Freq: Once | INTRAMUSCULAR | Status: DC

## 2021-04-08 MED ORDER — GABAPENTIN 300 MG PO CAPS
400.0000 mg | ORAL_CAPSULE | Freq: Three times a day (TID) | ORAL | Status: DC
  Administered 2021-04-08 (×2): 400 mg via ORAL
  Filled 2021-04-08 (×2): qty 1

## 2021-04-08 MED ORDER — QUETIAPINE FUMARATE 50 MG PO TABS: 150.0000 mg | ORAL_TABLET | Freq: Every day | ORAL | 0 refills | Status: DC

## 2021-04-08 MED ORDER — QUETIAPINE FUMARATE 25 MG PO TABS: 50.0000 mg | ORAL_TABLET | Freq: Every day | ORAL | Status: DC

## 2021-04-08 MED ORDER — HALOPERIDOL LACTATE 5 MG/ML IJ SOLN
INTRAMUSCULAR | Status: AC
  Filled 2021-04-08: qty 1

## 2021-04-08 MED ORDER — DIPHENHYDRAMINE HCL 50 MG/ML IJ SOLN
INTRAMUSCULAR | Status: AC
  Filled 2021-04-08: qty 1

## 2021-04-08 MED ORDER — QUETIAPINE FUMARATE 25 MG PO TABS: 150.0000 mg | ORAL_TABLET | Freq: Every day | ORAL | Status: DC

## 2021-04-08 MED ORDER — DIPHENHYDRAMINE HCL 50 MG/ML IJ SOLN
25.0000 mg | Freq: Once | INTRAMUSCULAR | Status: AC
  Administered 2021-04-08: 25 mg via INTRAMUSCULAR

## 2021-04-08 MED ORDER — ARIPIPRAZOLE 15 MG PO TABS: 15.0000 mg | ORAL_TABLET | Freq: Every day | ORAL | 0 refills | Status: DC

## 2021-04-08 MED ORDER — LORAZEPAM 2 MG/ML IJ SOLN: 2.0000 mg | Freq: Once | INTRAMUSCULAR | Status: AC

## 2021-04-08 MED ORDER — ARIPIPRAZOLE 15 MG PO TABS
15.0000 mg | ORAL_TABLET | Freq: Every day | ORAL | Status: DC
  Administered 2021-04-08: 15 mg via ORAL
  Filled 2021-04-08: qty 1

## 2021-04-08 MED ORDER — LORAZEPAM 2 MG/ML IJ SOLN
INTRAMUSCULAR | Status: AC
  Administered 2021-04-08: 2 mg via INTRAMUSCULAR
  Filled 2021-04-08: qty 1

## 2021-04-08 MED ORDER — DIPHENHYDRAMINE HCL 50 MG/ML IJ SOLN: 50.0000 mg | Freq: Once | INTRAMUSCULAR | Status: DC

## 2021-04-08 MED ORDER — SPIRONOLACTONE 100 MG PO TABS: 100.0000 mg | ORAL_TABLET | Freq: Every day | ORAL | 0 refills | Status: DC

## 2021-04-08 MED ORDER — CLORAZEPATE DIPOTASSIUM 3.75 MG PO TABS: 3.7500 mg | ORAL_TABLET | Freq: Two times a day (BID) | ORAL | 0 refills | Status: DC | PRN

## 2021-04-08 MED ORDER — QUETIAPINE FUMARATE 25 MG PO TABS
50.0000 mg | ORAL_TABLET | Freq: Every day | ORAL | Status: DC
  Administered 2021-04-08: 50 mg via ORAL
  Filled 2021-04-08: qty 2

## 2021-04-08 NOTE — ED Notes (Signed)
Boyfriend left number to call if pt gets d/c- [phone]
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
## 2021-04-08 NOTE — ED Provider Notes (Signed)
Patient has become very agitated, disruptive, banging on her, and yelling.  She appears to be escalating with regard to level of agitation.  Patient refused to take oral lorazepam.  I have ordered manual hold for medication, patient very abrasive agitated and unwilling to take other calming medication.  Patient at risk of injury to self and also escalating to the level could be dangerous to staff.  Order for multiple intramuscular medications including lorazepam and Benadryl ordered.     Sharyn Creamer, MD 04/08/21 [phone]

## 2021-04-08 NOTE — ED Notes (Signed)
IVC / pending reassessment in the AM. 

## 2021-04-08 NOTE — ED Notes (Signed)
Pt speaking to [facility] for second assessment.

## 2021-04-08 NOTE — ED Notes (Signed)
Given breakfast tray. 

## 2021-04-08 NOTE — ED Notes (Signed)
Pt willingly took medication for sleep. Pt apologized to this RN for behavior. Pt calm and resting at this time.

## 2021-04-08 NOTE — ED Notes (Signed)
Pt set up for telepsych meeting in interview room

## 2021-04-08 NOTE — ED Provider Notes (Signed)
Procedures     ----------------------------------------- 12:57 AM on 04/08/2021 -----------------------------------------  Patient remains combative, not redirectable, not able to de-escalate.  Requiring constant attendance and intermittent manual hold by multiple security personnel to maintain safety.  Will give additional IM Haldol and Benadryl to help calm her and reduce the need for physical restraint and associated risk of injury.    Sharman Cheek, MD 04/08/21 [phone]

## 2021-04-08 NOTE — BH Assessment (Signed)
Referral information for Psychiatric Hospitalization faxed to:  [facility] ([phone]-or- [phone]),   [facility] ([phone]---[phone]),  [facility] ([phone], [phone], [phone] or [phone]),   [place] ([phone] or [phone])  [place] [facility] ([phone]),   [facility] ([phone] -or- [phone]),   [facility] ([phone]).  [facility] ([phone])  

## 2021-04-08 NOTE — ED Provider Notes (Signed)
-----------------------------------------   5:13 PM on 04/08/2021 -----------------------------------------  I took over care on this patient from the prior ED providers.  She has been reevaluated by the [facility] telemetry psychiatrist and cleared for discharge.  The IVC has been rescinded.  On my reassessment, the patient is calm and cooperative.  She is answering questions appropriately.  She denies any SI or HI.  She states that she is feeling much better and feels safe to go home.  The psychiatrist has recommended restarting the patient on her Abilify which she has been noncompliant with.  In addition she also has been off of her Seroquel and clorazepate as well as spironolactone for acne, and requested refills of these as well.  I confirm the doses in the past medical record and have provided a 1 month supply of these medications.  The patient has follow-up plans for this week.  I gave her thorough return precautions and she expressed understanding.  She is stable for discharge at this time.   Dionne Bucy, MD 04/08/21 1715

## 2021-04-08 NOTE — ED Notes (Signed)
Pt and visitor made aware of recommendation for inpatient. Pt becomes irrate and starts yelling and cussing at this RN. Visitor calms pt and asks to speak to the psychiatrist. Secretary attempting to get [facility] MD to contact this RN.

## 2021-04-08 NOTE — Discharge Instructions (Addendum)
Restart the Abilify and other medications as prescribed.  We have prescribed a 30-day supply.  Follow-up with a psychiatrist either at [facility] or another outpatient psychiatrist of your choice within the next several days.  Return to the ER immediately for new, worsening, or recurrent thoughts of wanting to hurt or kill yourself or anyone else, hearing voices, other hallucinations, or any other new or worsening symptoms that concern you.

## 2021-04-08 NOTE — ED Notes (Signed)
Visitor remains at bedside arguing with this RN about leaving. Visitor wants to get pt out of here. Recommended that he speak to magistrate or wait for psychiatrist to call back. Visitor states that he is going to Clinical cytogeneticist.
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
## 2021-04-08 NOTE — ED Notes (Addendum)
ptwould not cooperate with medication administration after speaking with pt pt continued to be uncooperative with medication administration pt screaming in hallway. Manual hold performed by security to bilateral arms and legs for approx 40 seconds while mediation administered then pt released.

## 2021-04-08 NOTE — ED Notes (Signed)
Per [facility] reassessment, pt able to be d/c with prescriptions back to boyfriend. MD aware. Waiting for fax.

## 2021-04-08 NOTE — ED Notes (Signed)
This RN and April RN went to pt and tried get pt to willingly accept medication. Pt kept repeating that she wanted to talk to the doctor and wanted to go home. Pt not compliant at this time.   Manual hold at 1231 started. Medications administered by this RN and April RN. Manual hold ended at 1233.

## 2021-04-08 NOTE — ED Notes (Signed)
Pt crying in the hallway. This RN asked pt again if she would like medicine to calm to down, pt stated "No, I want my clothes and to go home." This Rn educated pt that that was unable to happen. Pt yelled at this RN saying, "fine then don't talk to me." This RN walked away.

## 2021-04-08 NOTE — ED Provider Notes (Signed)
Emergency Medicine Observation Re-evaluation Note  Nicole Morgan is a 25 y.o. female, seen on rounds today.  Pt initially presented to the ED for complaints of No chief complaint on file.  Currently, the patient is calm, no acute complaints.  Physical Exam  Blood pressure 136/88, pulse (!) 138, temperature 98.6 F (37 C), temperature source Oral, resp. rate 20, height 5\' 1"  (1.549 m), weight 54.4 kg, SpO2 99 %. Physical Exam General: NAD Lungs: CTAB Psych: not agitated  ED Course / MDM  EKG:    I have reviewed the labs performed to date as well as medications administered while in observation.  Recent changes in the last 24 hours include no acute events overnight.  Requried IM ativan overnight due to combativeness and was eventually able to regain control of herself and calm down   Plan  Current plan is for psych reassessment and disposition. Patient is under full IVC at this time.   , MD 04/08/21 [phone]

## 2021-04-08 NOTE — ED Notes (Signed)
Per SOC, pt recommended for restart on meds and inpatient admission.

## 2021-04-08 NOTE — ED Notes (Signed)
Visitor (boyfriend) leaving at this time. Was given psych area rules sheet earlier including visiting hours. Informed again that visiting policy is x1 visitor for 15 mins a day.

## 2021-04-08 NOTE — ED Provider Notes (Addendum)
Procedures     ----------------------------------------- 2:05 AM on 04/08/2021 -----------------------------------------  Patient has now been able to calm her self down.  She requests some oral medication to help her sleep.  She did not require intramuscular Haldol earlier after continuing to escalate.  She reports being on Seroquel when she was taking her medicine.  I will give her 50 mg tonight to help sleep and she can be reassessed by psychiatry in the morning.  Restraints can be discontinued at this point.    Sharman Cheek, MD 04/08/21 Jackey Loge    Sharman Cheek, MD 04/08/21 [phone]

## 2021-04-29 ENCOUNTER — Other Ambulatory Visit: Payer: Self-pay | Admitting: Adult Health

## 2021-04-30 NOTE — Telephone Encounter (Signed)
Pt has not been seen since 05/2020  Last 2 apts she was a no show

## 2021-04-30 NOTE — Telephone Encounter (Signed)
Refuse until appt.

## 2021-05-14 ENCOUNTER — Other Ambulatory Visit: Payer: Self-pay

## 2021-05-14 ENCOUNTER — Ambulatory Visit (HOSPITAL_COMMUNITY)
Admission: EM | Admit: 2021-05-14 | Discharge: 2021-05-14 | Disposition: A | Discharge status: Home / Self Care | Payer: No Typology Code available for payment source | Attending: Emergency Medicine | Admitting: Emergency Medicine

## 2021-05-14 ENCOUNTER — Encounter (HOSPITAL_BASED_OUTPATIENT_CLINIC_OR_DEPARTMENT_OTHER): Payer: Self-pay | Admitting: Obstetrics and Gynecology

## 2021-05-14 ENCOUNTER — Emergency Department (HOSPITAL_BASED_OUTPATIENT_CLINIC_OR_DEPARTMENT_OTHER)
Admission: EM | Admit: 2021-05-14 | Discharge: 2021-05-14 | Disposition: A | Discharge status: Home / Self Care | Payer: Self-pay | Attending: Emergency Medicine | Admitting: Emergency Medicine

## 2021-05-14 DIAGNOSIS — T7421XA Adult sexual abuse, confirmed, initial encounter: Secondary | ICD-10-CM | POA: Insufficient documentation

## 2021-05-14 DIAGNOSIS — S40022A Contusion of left upper arm, initial encounter: Secondary | ICD-10-CM | POA: Insufficient documentation

## 2021-05-14 DIAGNOSIS — N939 Abnormal uterine and vaginal bleeding, unspecified: Secondary | ICD-10-CM | POA: Insufficient documentation

## 2021-05-14 DIAGNOSIS — S8011XA Contusion of right lower leg, initial encounter: Secondary | ICD-10-CM | POA: Insufficient documentation

## 2021-05-14 DIAGNOSIS — Z0441 Encounter for examination and observation following alleged adult rape: Secondary | ICD-10-CM | POA: Insufficient documentation

## 2021-05-14 DIAGNOSIS — Z87891 Personal history of nicotine dependence: Secondary | ICD-10-CM | POA: Insufficient documentation

## 2021-05-14 DIAGNOSIS — S5012XA Contusion of left forearm, initial encounter: Secondary | ICD-10-CM | POA: Insufficient documentation

## 2021-05-14 DIAGNOSIS — X58XXXA Exposure to other specified factors, initial encounter: Secondary | ICD-10-CM | POA: Insufficient documentation

## 2021-05-14 LAB — URINALYSIS, ROUTINE W REFLEX MICROSCOPIC
Bilirubin Urine: NEGATIVE
Glucose, UA: NEGATIVE mg/dL
Hgb urine dipstick: NEGATIVE
Ketones, ur: NEGATIVE mg/dL
Nitrite: NEGATIVE
Protein, ur: NEGATIVE mg/dL
Specific Gravity, Urine: 1.008 (ref 1.005–1.030)
pH: 7.5 (ref 5.0–8.0)

## 2021-05-14 LAB — PREGNANCY, URINE: Preg Test, Ur: NEGATIVE

## 2021-05-14 MED ORDER — ELVITEG-COBIC-EMTRICIT-TENOFAF 150-150-200-10 MG PREPACK: 1.0000 | ORAL_TABLET | Freq: Once | ORAL | Status: DC

## 2021-05-14 MED ORDER — ULIPRISTAL ACETATE 30 MG PO TABS
30.0000 mg | ORAL_TABLET | Freq: Once | ORAL | Status: AC
  Administered 2021-05-14: 30 mg via ORAL
  Filled 2021-05-14: qty 1

## 2021-05-14 MED ORDER — ELVITEG-COBIC-EMTRICIT-TENOFAF 150-150-200-10 MG PO TABS: 1.0000 | ORAL_TABLET | Freq: Every day | ORAL | 0 refills | Status: DC

## 2021-05-14 MED ORDER — METRONIDAZOLE 500 MG PO TABS
2000.0000 mg | ORAL_TABLET | Freq: Once | ORAL | Status: AC
  Administered 2021-05-14: 2000 mg via ORAL
  Filled 2021-05-14: qty 4

## 2021-05-14 MED ORDER — AZITHROMYCIN 250 MG PO TABS
1000.0000 mg | ORAL_TABLET | Freq: Once | ORAL | Status: AC
  Administered 2021-05-14: 1000 mg via ORAL
  Filled 2021-05-14: qty 4

## 2021-05-14 MED ORDER — CEFTRIAXONE SODIUM 500 MG IJ SOLR
500.0000 mg | Freq: Once | INTRAMUSCULAR | Status: AC
  Administered 2021-05-14: 500 mg via INTRAMUSCULAR
  Filled 2021-05-14: qty 500

## 2021-05-14 MED ORDER — HEPATITIS B VAC RECOMBINANT 10 MCG/ML IJ SUSP
1.0000 mL | Freq: Once | INTRAMUSCULAR | Status: DC
  Filled 2021-05-14: qty 1

## 2021-05-14 MED ORDER — LIDOCAINE HCL (PF) 1 % IJ SOLN
1.0000 mL | Freq: Once | INTRAMUSCULAR | Status: AC
  Administered 2021-05-14: 1 mL
  Filled 2021-05-14: qty 5
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
## 2021-05-14 NOTE — SANE Note (Signed)
Patient mistakenly thought she had reported to [facility].  After several phone calls by FNE, it was determined that patient needed to report to [facility].  When asked if she wished for FNE to contact [place] to send a deputy to speak with her, patient stated she wished to make the report in the morning.  Patient was provided with contact information for the Forensic Nursing Department.  FNE requested that patient contact Forensic Nursing with the case number once she has made the report to [facility] Department.  Patient agreed.
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
## 2021-05-14 NOTE — SANE Note (Addendum)
The patient may eat and/or drink.  If the patient needs to void, then please collect a urine specimen, and save the toilet tissue in the patient's room.  The SANE/FNE will be in to see the patient in approximately 1.0-1.5 hours.

## 2021-05-14 NOTE — Discharge Instructions (Addendum)
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 the event. This medicine can be taken at any time during your menstrual cycle. Follow the dose instructions of your health care provider exactly. Contact your health care provider right away if you vomit within 3 hours of taking your medicine to discuss if you need to take another tablet. A patient package insert for  the product will be given with each prescription and refill. Read this sheet carefully each time. The sheet may change frequently. Contact your pediatrician regarding the use of this medicine in children. Special care may be needed. Overdosage: If you think you have taken too much of this medicine contact a poison control center or emergency room at once. NOTE: This medicine is only for you. Do not share this medicine with others. What if I miss a dose? This medicine is not for regular use. If you vomit within 3 hours of  taking your dose, contact your health care professional for instructions. What may interact with this medicine? This medicine may interact with the following medications: barbiturates such as phenobarbital or primidone birth control pills bosentan carbamazepine certain medicines for fungal infections like griseofulvin, itraconazole, and ketoconazole certain medicines for HIV or AIDS or hepatitis dabigatran digoxin felbamate fexofenadine oxcarbazepine phenytoin rifampin St. John's Wort topiramate This list may not describe all possible interactions. Give your health care provider a list of all the medicines, herbs, non-prescription drugs, or dietary supplements you use. Also tell them if you smoke, drink alcohol, or use illegal drugs. Some items may interact with your medicine. What should I watch for while using this medicine? Your period may begin a few days earlier or later than expected. If your period is more than 7 days late, pregnancy is possible. See your health care provider as soon as you can and get a pregnancy test. Talk to your healthcare provider before taking this medicine if you know or suspect that you are pregnant. Contact your healthcare provider if you think you may be pregnant and you have taken this medicine. If you have severe abdominal pain about 3 to 5 weeks after taking this medicine, you may have a pregnancy outside the womb, which is called an ectopic or tubal pregnancy. Call your health care provider or go to the nearest emergency room right away if you think this is happening. Discuss birth control options with your health care provider. Emergency birth control is not to be used routinely to prevent pregnancy. It should not be used more than once in the same cycle. Birth control pills may not work properly while you are taking this medicine. Wait at least 5 days after taking this medicine to start or continue other hormone based birth control. Be sure to use a  reliable barrier contraceptive method (such as a condom with spermicide) between the time you take this medicine and your next period. This medicine does not protect you against HIV infection (AIDS) or any other sexually transmitted diseases (STDs). What side effects may I notice from receiving this medicine? Side effects that you should report to your doctor or health care professional as soon as possible: allergic reactions like skin rash, itching or hives, swelling of the face, lips, or tongue Side effects that usually do not require medical attention (report to your doctor or health care professional if they continue or are bothersome): abdominal pain or cramping dizziness headache nausea spotting tiredness This list may not describe all possible side effects. Call your doctor for medical advice about side effects. You may report side effects to FDA at 1-800-FDA-1088. Where should I keep my medicine? Keep out of the reach of children. Store at between 20 and 25 degrees C (68 and 77 degrees F). Protect from light and keep in the blister card inside the original box until you are ready to take it. Throw away any unused medicine after the  expiration date. NOTE: This sheet is a summary. It may not cover all possible information. If you have questions about this medicine, talk to your doctor, pharmacist, or health care provider.  2020 Elsevier/Gold Standard (2017-01-24 14:27:59)
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
## 2021-08-20 ENCOUNTER — Encounter (HOSPITAL_COMMUNITY): Payer: Self-pay | Admitting: Emergency Medicine

## 2021-08-20 ENCOUNTER — Other Ambulatory Visit: Payer: Self-pay

## 2021-08-20 ENCOUNTER — Ambulatory Visit (HOSPITAL_COMMUNITY)
Admission: EM | Admit: 2021-08-20 | Discharge: 2021-08-20 | Disposition: A | Discharge status: Home / Self Care | Payer: Self-pay | Attending: Physician Assistant | Admitting: Physician Assistant

## 2021-08-20 DIAGNOSIS — Z113 Encounter for screening for infections with a predominantly sexual mode of transmission: Secondary | ICD-10-CM | POA: Insufficient documentation

## 2021-08-20 DIAGNOSIS — R1030 Lower abdominal pain, unspecified: Secondary | ICD-10-CM | POA: Insufficient documentation

## 2021-08-20 DIAGNOSIS — R8290 Unspecified abnormal findings in urine: Secondary | ICD-10-CM | POA: Insufficient documentation

## 2021-08-20 LAB — POCT URINALYSIS DIPSTICK, ED / UC
Bilirubin Urine: NEGATIVE
Glucose, UA: NEGATIVE mg/dL
Hgb urine dipstick: NEGATIVE
Ketones, ur: NEGATIVE mg/dL
Nitrite: NEGATIVE
Protein, ur: NEGATIVE mg/dL
Specific Gravity, Urine: 1.02 (ref 1.005–1.030)
Urobilinogen, UA: 0.2 mg/dL (ref 0.0–1.0)
pH: 7 (ref 5.0–8.0)

## 2021-08-20 LAB — POC URINE PREG, ED: Preg Test, Ur: NEGATIVE

## 2021-08-20 MED ORDER — NITROFURANTOIN MONOHYD MACRO 100 MG PO CAPS: 100.0000 mg | ORAL_CAPSULE | Freq: Two times a day (BID) | ORAL | 0 refills | Status: DC

## 2021-08-20 NOTE — ED Triage Notes (Signed)
Since Friday having intermittent lower abd pains with nausea. Denies vomiting, urinary or bowel problems.

## 2021-08-20 NOTE — Discharge Instructions (Signed)
Your urine did have some white blood cells which could be a sign of infection.  We are going to start you on an antibiotic.  Take this twice a day for 5 days.  We will send your urine off for culture and if we change antibiotics we will contact you.  Make sure you are drinking plenty of fluid.  If you have any worsening symptoms including severe abdominal pain, fever, nausea, vomiting you need to go to the emergency room.

## 2021-08-20 NOTE — ED Provider Notes (Signed)
[place]    CSN: [id number] Arrival date & time: 08/20/21  [id number]      History   Chief Complaint Chief Complaint  Patient presents with   Abdominal Pain   Nausea    HPI Nicole Morgan is a 25 y.o. female.   Patient presents today with a 4-day history of lower abdominal pain.  Reports pain is intermittent and she is currently asymptomatic.  She has not identified any trigger but reports that during episodes pain is localized to lower abdomen without radiation.  She reports associated nausea but denies any vomiting.  Denies any urinary symptoms including frequency, urgency, hematuria.  Denies any abnormal vaginal discharge, bleeding, pain.  Does report that she is late for her menstrual cycle with LMP 07/08/2021.  She is not currently on any birth control and is not using protection.  Last bowel movement was earlier today was described as normal.  Denies any fever, melena, changes in bowel habits.  Denies any recent antibiotic use, medication changes, recent travel, known sick contacts.   Past Medical History:  Diagnosis Date   Cystic fibrosis carrier    Depression    no meds   Eczema    Incompetent cervix     Patient Active Problem List   Diagnosis Date Noted   Polysubstance abuse ([place]) 04/08/2021   Bipolar 1 disorder ([place]) 04/08/2021   Closed fracture of one rib of left side    Chronic migraine without aura, with intractable migraine, so stated, with status migrainosus 11/10/2019    Past Surgical History:  Procedure Laterality Date   CERVICAL CERCLAGE  06/06/2012   Procedure: CERCLAGE CERVICAL;  Surgeon: Melina Schools, MD;  Location: [facility];  Service: Gynecology;  Laterality: N/A;   NO PAST SURGERIES     WISDOM TOOTH EXTRACTION      OB History     Gravida  1   Para  1   Term  1   Preterm  0   AB  0   Living  1      SAB  0   IAB  0   Ectopic  0   Multiple  0   Live Births  1            Home Medications    Prior to  Admission medications   Medication Sig Start Date End Date Taking? Authorizing Provider  nitrofurantoin, macrocrystal-monohydrate, (MACROBID) 100 MG capsule Take 1 capsule (100 mg total) by mouth 2 (two) times daily. 08/20/21  Yes Murle Otting K, PA-C  amitriptyline (ELAVIL) 10 MG tablet TAKE ONE TABLET BY MOUTH AT BEDTIME **APPOINTMENT NEEDED FOR FURTHER REFILLS* Patient not taking: Reported on 04/07/2021 06/26/20   Melvenia Beam, MD  ARIPiprazole (ABILIFY) 15 MG tablet Take 1 tablet (15 mg total) by mouth daily. 04/08/21 05/08/21  Arta Silence, MD  clorazepate (TRANXENE) 3.75 MG tablet Take 1 tablet (3.75 mg total) by mouth 2 (two) times daily as needed for anxiety. 04/08/21   Arta Silence, MD  Diclofenac Sodium CR 100 MG 24 hr tablet Take 1 tablet (100 mg total) by mouth daily. Patient not taking: Reported on 04/07/2021 01/31/21   Palumbo, April, MD  gabapentin (NEURONTIN) 300 MG capsule Take 2 capsules (600 mg total) by mouth 3 (three) times daily. Patient not taking: Reported on 04/07/2021 11/10/19   Melvenia Beam, MD  ibuprofen (ADVIL) 200 MG tablet Take 200 mg by mouth as needed. Patient not taking: Reported on 04/07/2021  [provider]  lidocaine (LIDODERM) 5 % Place 1 patch onto the skin daily. Remove & Discard patch within 12 hours or as directed by MD Patient not taking: Reported on 04/07/2021 01/31/21   Palumbo, April, MD  ondansetron (ZOFRAN-ODT) 4 MG disintegrating tablet Take 1 tablet (4 mg total) by mouth every 8 (eight) hours as needed for nausea. For nausea or migraine. May take with Rizatriptan. Patient not taking: Reported on 04/07/2021 11/10/19   Anson Fret, MD  predniSONE (DELTASONE) 20 MG tablet Take 10 mg by mouth every other day. Patient not taking: Reported on 04/07/2021 10/01/19   [provider]  QUEtiapine (SEROQUEL) 50 MG tablet Take 3 tablets (150 mg total) by mouth at bedtime. 04/08/21 05/08/21  Dionne Bucy, MD  rizatriptan  (MAXALT-MLT) 10 MG disintegrating tablet Take 1 tablet (10 mg total) by mouth as needed for migraine. May repeat in 2 hours if needed Patient not taking: Reported on 04/07/2021 11/10/19   Anson Fret, MD    Family History Family History  Problem Relation Age of Onset   Bipolar disorder Mother        schizophrenic   Mental illness Mother        schizophrenia   Deep vein thrombosis Maternal Aunt    Alcohol abuse Maternal Grandmother    Mental illness Maternal Grandmother    Bipolar disorder Maternal Grandmother        schizophrenic    Cancer Maternal Grandmother        lymph nodes   Migraines Neg Hx    Headache Neg Hx     Social History Social History   Tobacco Use   Smoking status: Former    Packs/day: 0.25    Years: 1.00    Pack years: 0.25    Types: Cigarettes    Quit date: 02/22/2012    Years since quitting: 9.4   Smokeless tobacco: Never  Vaping Use   Vaping Use: Every day   Substances: Nicotine, Flavoring  Substance Use Topics   Alcohol use: Yes    Comment: 1-2 beers every now and then   Drug use: Yes    Types: Marijuana, Cocaine    Comment: "the freaking weekends", last use 04/06/2021     Allergies   Patient has no known allergies.   Review of Systems Review of Systems  Constitutional:  Positive for activity change. Negative for appetite change, fatigue and fever.  Respiratory:  Negative for cough and shortness of breath.   Cardiovascular:  Negative for chest pain.  Gastrointestinal:  Positive for abdominal pain and nausea. Negative for blood in stool, constipation, diarrhea and vomiting.  Genitourinary:  Positive for menstrual problem. Negative for decreased urine volume, dysuria, frequency, genital sores, pelvic pain, urgency, vaginal bleeding, vaginal discharge and vaginal pain.  Musculoskeletal:  Negative for arthralgias, back pain and myalgias.  Neurological:  Negative for dizziness, light-headedness and headaches.    Physical Exam Triage Vital  Signs ED Triage Vitals  Enc Vitals Group     BP 08/20/21 1106 122/77     Pulse Rate 08/20/21 1106 78     Resp 08/20/21 1106 16     Temp 08/20/21 1106 98.6 F (37 C)     Temp Source 08/20/21 1106 Oral     SpO2 08/20/21 1106 100 %     Weight --      Height --      Head Circumference --      Peak Flow --  Pain Score 08/20/21 1103 0     Pain Loc --      Pain Edu? --      Excl. in [place]? --    No data found.  Updated Vital Signs BP 122/77 (BP Location: Right Arm)   Pulse 78   Temp 98.6 F (37 C) (Oral)   Resp 16   LMP 07/09/2021 (Approximate)   SpO2 100%   Visual Acuity Right Eye Distance:   Left Eye Distance:   Bilateral Distance:    Right Eye Near:   Left Eye Near:    Bilateral Near:     Physical Exam Vitals reviewed.  Constitutional:      General: She is awake. She is not in acute distress.    Appearance: Normal appearance. She is well-developed. She is not ill-appearing.     Comments: Very pleasant female appears stated age in no acute distress sitting comfortably on exam room table  HENT:     Head: Normocephalic and atraumatic.  Cardiovascular:     Rate and Rhythm: Normal rate and regular rhythm.     Heart sounds: Normal heart sounds, S1 normal and S2 normal. No murmur heard. Pulmonary:     Effort: Pulmonary effort is normal.     Breath sounds: Normal breath sounds. No wheezing, rhonchi or rales.     Comments: Clear to auscultation bilaterally Abdominal:     General: Bowel sounds are normal.     Palpations: Abdomen is soft.     Tenderness: There is no abdominal tenderness. There is no right CVA tenderness, left CVA tenderness, guarding or rebound.     Comments: Benign abdominal exam; no tenderness to palpation on exam. No evidence of acute abdomen on physical exam.    Genitourinary:    Comments: Exam deferred Psychiatric:        Behavior: Behavior is cooperative.     UC Treatments / Results  Labs (all labs ordered are listed, but only abnormal  results are displayed) Labs Reviewed  POCT URINALYSIS DIPSTICK, ED / UC - Abnormal; Notable for the following components:      Result Value   Leukocytes,Ua TRACE (*)    All other components within normal limits  URINE CULTURE  POC URINE PREG, ED  CERVICOVAGINAL ANCILLARY ONLY    EKG   Radiology No results found.  Procedures Procedures (including critical care time)  Medications Ordered in UC Medications - No data to display  Initial Impression / Assessment and Plan / UC Course  I have reviewed the triage vital signs and the nursing notes.  Pertinent labs & imaging results that were available during my care of the patient were reviewed by me and considered in my medical decision making (see chart for details).     Vital signs and physical exam reassuring today; no indication for emergent evaluation or imaging.  Urine pregnancy test was negative in clinic.  UA did show trace leukocyte Estrace.  Will cover for UTI with Macrobid given clinical presentation.  Urine culture was obtained-results pending.  Discussed potential need to change antibiotics based on susceptibilities identified on culture.  STI swab was collected we will defer additional treatment until results are obtained.  She was instructed to rest and drink plenty of fluid.  Discussed that if she has any alarm or worsening symptoms including severe abdominal pain, fever, nausea/vomiting interfering with oral intake she needs to go to the emergency room.  Recommended follow-up with PCP within a week to ensure symptom improvement.  Strict  return precautions given to which she expressed understanding.  Final Clinical Impressions(s) / UC Diagnoses   Final diagnoses:  Lower abdominal pain  Abnormal urinalysis  Routine screening for STI (sexually transmitted infection)     Discharge Instructions      Your urine did have some white blood cells which could be a sign of infection.  We are going to start you on an  antibiotic.  Take this twice a day for 5 days.  We will send your urine off for culture and if we change antibiotics we will contact you.  Make sure you are drinking plenty of fluid.  If you have any worsening symptoms including severe abdominal pain, fever, nausea, vomiting you need to go to the emergency room.     ED Prescriptions     Medication Sig Dispense Auth. Provider   nitrofurantoin, macrocrystal-monohydrate, (MACROBID) 100 MG capsule Take 1 capsule (100 mg total) by mouth 2 (two) times daily. 10 capsule Jaquarious Grey, Derry Skill, PA-C      PDMP not reviewed this encounter.   Terrilee Croak, PA-C 08/20/21 1143

## 2021-08-21 ENCOUNTER — Telehealth (HOSPITAL_COMMUNITY): Payer: Self-pay | Admitting: Emergency Medicine

## 2021-08-21 LAB — CERVICOVAGINAL ANCILLARY ONLY
Bacterial Vaginitis (gardnerella): NEGATIVE
Candida Glabrata: POSITIVE — AB
Candida Vaginitis: POSITIVE — AB
Chlamydia: NEGATIVE
Comment: NEGATIVE
Comment: NEGATIVE
Comment: NEGATIVE
Comment: NEGATIVE
Comment: NEGATIVE
Comment: NORMAL
Neisseria Gonorrhea: NEGATIVE
Trichomonas: NEGATIVE

## 2021-08-21 LAB — URINE CULTURE

## 2021-08-21 MED ORDER — FLUCONAZOLE 150 MG PO TABS: 150.0000 mg | ORAL_TABLET | Freq: Once | ORAL | 0 refills | Status: AC

## 2021-09-23 NOTE — L&D Delivery Note (Addendum)
OB/GYN Faculty Practice Delivery Note  Nicole Morgan is a 26 y.o. G2P1001 s/p SVD at [redacted]w[redacted]d. She was admitted for IOL for oligohydramnios. 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    ROM: Unclear time of SROM, was ruptured at beginning of second stage. Clear fluid at delivery.  GBS Status: neg Maximum Maternal Temperature: 98.1  Labor Progress: Progressed quickly from 1.5 cm on admission to active labor, active phase only for ~2 hours prior to delivery. Induced with cytotec x1.   Delivery Date/Time: 07/25/22, 1950 Delivery: Called to room and patient was complete and pushing. Head delivered LOA. No nuchal cord present. Shoulder and body delivered in usual fashion. Infant with spontaneous cry, placed on mother's abdomen, dried and stimulated. Cord clamped x 2 after 1-minute delay, and cut by myself. Cord blood drawn. Placenta delivered spontaneously with gentle cord traction. Fundus firm with massage and Pitocin. Labia, perineum, vagina, and cervix were inspected.   Placenta: complete, three vessel cord appreciated. Bilobed morphology. Sent to pathology. Complications: none Lacerations: none EBL: 154 mL Analgesia: epidural  Postpartum Planning [x ] message to sent to schedule follow-up  [ ]  vaccines UTD  Infant: female  APGARs 8/9 (did have temporary hypoxia that improved after blow by O2, suction)  weight pending  Cecilio Asper, [place] for [place]:  I confirm that I have verified the information documented in the resident's note and that I have also personally reperformed the physical exam and all medical decision making activities.   I was gloved and present for entire delivery SVD without incident No difficulty with shoulders No lacerations  Seabron Spates, CNM
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
## 2021-12-19 LAB — PREGNANCY, URINE: Preg Test, Ur: POSITIVE

## 2022-02-28 LAB — DRUG SCREEN, URINE
Drug Screen, Urine: POSITIVE
Ferritin: 27
Pap: NEGATIVE
Urine Culture, OB: NEGATIVE

## 2022-03-20 LAB — OB RESULTS CONSOLE RUBELLA ANTIBODY, IGM: Rubella: NON-IMMUNE/NOT IMMUNE

## 2022-03-20 LAB — OB RESULTS CONSOLE RPR: RPR: NONREACTIVE

## 2022-03-20 LAB — OB RESULTS CONSOLE ANTIBODY SCREEN: Antibody Screen: NEGATIVE

## 2022-03-20 LAB — OB RESULTS CONSOLE PLATELET COUNT: Platelets: 295

## 2022-03-20 LAB — OB RESULTS CONSOLE HGB/HCT, BLOOD
HCT: 38 (ref 29–41)
Hemoglobin: 12.5

## 2022-03-20 LAB — HEPATITIS C ANTIBODY: HCV Ab: NEGATIVE

## 2022-03-20 LAB — OB RESULTS CONSOLE GC/CHLAMYDIA
Chlamydia: NEGATIVE
Neisseria Gonorrhea: NEGATIVE

## 2022-03-20 LAB — OB RESULTS CONSOLE HEPATITIS B SURFACE ANTIGEN: Hepatitis B Surface Ag: NEGATIVE

## 2022-03-20 LAB — OB RESULTS CONSOLE ABO/RH: "RH Type ": NEGATIVE

## 2022-04-01 ENCOUNTER — Inpatient Hospital Stay (HOSPITAL_COMMUNITY): Payer: Self-pay

## 2022-04-01 ENCOUNTER — Inpatient Hospital Stay (HOSPITAL_BASED_OUTPATIENT_CLINIC_OR_DEPARTMENT_OTHER): Payer: Medicaid Other

## 2022-04-01 ENCOUNTER — Inpatient Hospital Stay (HOSPITAL_COMMUNITY)
Admission: AD | Admit: 2022-04-01 | Discharge: 2022-04-01 | Disposition: A | Discharge status: Home / Self Care | Payer: No Typology Code available for payment source | Attending: Obstetrics and Gynecology | Admitting: Obstetrics and Gynecology

## 2022-04-01 ENCOUNTER — Other Ambulatory Visit: Payer: Self-pay

## 2022-04-01 ENCOUNTER — Encounter: Payer: Self-pay | Admitting: Student

## 2022-04-01 DIAGNOSIS — O0932 Supervision of pregnancy with insufficient antenatal care, second trimester: Secondary | ICD-10-CM | POA: Diagnosis not present

## 2022-04-01 DIAGNOSIS — Z3A20 20 weeks gestation of pregnancy: Secondary | ICD-10-CM | POA: Diagnosis not present

## 2022-04-01 DIAGNOSIS — R42 Dizziness and giddiness: Secondary | ICD-10-CM | POA: Diagnosis not present

## 2022-04-01 DIAGNOSIS — O9A212 Injury, poisoning and certain other consequences of external causes complicating pregnancy, second trimester: Secondary | ICD-10-CM

## 2022-04-01 DIAGNOSIS — O26892 Other specified pregnancy related conditions, second trimester: Secondary | ICD-10-CM | POA: Insufficient documentation

## 2022-04-01 DIAGNOSIS — O99342 Other mental disorders complicating pregnancy, second trimester: Secondary | ICD-10-CM | POA: Insufficient documentation

## 2022-04-01 DIAGNOSIS — F32A Depression, unspecified: Secondary | ICD-10-CM | POA: Insufficient documentation

## 2022-04-01 DIAGNOSIS — O3432 Maternal care for cervical incompetence, second trimester: Secondary | ICD-10-CM | POA: Insufficient documentation

## 2022-04-01 DIAGNOSIS — R45851 Suicidal ideations: Secondary | ICD-10-CM | POA: Insufficient documentation

## 2022-04-01 DIAGNOSIS — R0989 Other specified symptoms and signs involving the circulatory and respiratory systems: Secondary | ICD-10-CM | POA: Insufficient documentation

## 2022-04-01 DIAGNOSIS — R519 Headache, unspecified: Secondary | ICD-10-CM | POA: Insufficient documentation

## 2022-04-01 DIAGNOSIS — O09292 Supervision of pregnancy with other poor reproductive or obstetric history, second trimester: Secondary | ICD-10-CM

## 2022-04-01 LAB — BASIC METABOLIC PANEL
Anion gap: 11 (ref 5–15)
BUN: <5 mg/dL — ABNORMAL LOW (ref 6–20)
CO2: 22 mmol/L (ref 22–32)
Calcium: 8.7 mg/dL — ABNORMAL LOW (ref 8.9–10.3)
Chloride: 103 mmol/L (ref 98–111)
Creatinine, Ser: 0.54 mg/dL (ref 0.44–1.00)
GFR, Estimated: >60 mL/min (ref >60)
Glucose, Bld: 91 mg/dL (ref 70–99)
Potassium: 3.7 mmol/L (ref 3.5–5.1)
Sodium: 136 mmol/L (ref 135–145)

## 2022-04-01 LAB — KLEIHAUER-BETKE STAIN
# Vials RhIg: 1
Fetal Cells %: 0 %
Quantitation Fetal Hemoglobin: 0 mL

## 2022-04-01 MED ORDER — IOHEXOL 350 MG/ML SOLN
75.0000 mL | Freq: Once | INTRAVENOUS | Status: AC | PRN
  Administered 2022-04-01: 75 mL via INTRAVENOUS

## 2022-04-01 MED ORDER — ACETAMINOPHEN 500 MG PO TABS
1000.0000 mg | ORAL_TABLET | Freq: Once | ORAL | Status: AC
  Administered 2022-04-01: 1000 mg via ORAL
  Filled 2022-04-01: qty 2
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
## 2022-04-01 NOTE — BH Assessment (Deleted)
Clinician reviewed chart in an attempt to complete pt's MH Assessment. However, at this time, there is no EDP documentation and no explanation as to pt's primary complaints. TTS to assess at a later time.
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
## 2022-04-02 ENCOUNTER — Telehealth: Payer: Self-pay | Admitting: Family Medicine

## 2022-04-02 NOTE — Telephone Encounter (Signed)
Attempted to call patient to set up prenatal care, there was no answer to the phone call and number is not in service with the message sent saying her phone might be broken. Will attempt to reach out to patient at a later time.

## 2022-04-03 ENCOUNTER — Encounter: Payer: Self-pay | Admitting: *Deleted

## 2022-04-09 ENCOUNTER — Telehealth: Payer: Self-pay

## 2022-04-09 ENCOUNTER — Ambulatory Visit (INDEPENDENT_AMBULATORY_CARE_PROVIDER_SITE_OTHER): Payer: Self-pay

## 2022-04-09 ENCOUNTER — Other Ambulatory Visit: Payer: Self-pay

## 2022-04-09 VITALS — BP 105/75 | HR 90 | Wt 134.0 lb

## 2022-04-09 DIAGNOSIS — Z3A20 20 weeks gestation of pregnancy: Secondary | ICD-10-CM

## 2022-04-09 DIAGNOSIS — Z141 Cystic fibrosis carrier: Secondary | ICD-10-CM | POA: Insufficient documentation

## 2022-04-09 DIAGNOSIS — O0990 Supervision of high risk pregnancy, unspecified, unspecified trimester: Secondary | ICD-10-CM | POA: Insufficient documentation

## 2022-04-09 NOTE — Telephone Encounter (Signed)
Attempt to call patient to inform her MFM OB US Transvaginal will be schedule tomorrow at 7:45AM with arrival at 7:30AM. No answer and LVM for patient.  Will try again and will send patient a MyChart message.  -Felecia Shelling  CMA

## 2022-04-09 NOTE — Progress Notes (Signed)
New OB Intake  Patient came in person for her New Ob Intake.   I discussed the limitations, risks, security and privacy concerns of performing an evaluation and management service by telephone and the availability of in person appointments. I also discussed with the patient that there may be a patient responsible charge related to this service. The patient expressed understanding and agreed to proceed.  I explained I am completing New OB Intake today. We discussed her EDD of 08/13/2022 that is based on LMP of 11/06/2021. Pt is G2/P1. I reviewed her allergies, medications, Medical/Surgical/OB history, and appropriate screenings. I informed her of [facility] services. [facility] information placed in AVS. Based on history, this is a/an  pregnancy complicated by hx of incompetence cervix  .   Patient Active Problem List   Diagnosis Date Noted   Cystic fibrosis carrier 04/09/2022   Supervision of high risk pregnancy, antepartum 04/09/2022   Polysubstance abuse (HCC) 04/08/2021   Bipolar 1 disorder (HCC) 04/08/2021   Closed fracture of one rib of left side    Chronic migraine without aura, with intractable migraine, so stated, with status migrainosus 11/10/2019    Concerns addressed today: -Patient is currently seeing [facility] for her depression and anxiety.  -Pt has a hx of incompetence cervix and cerclage placed at 20 weeks at her last pregnancy. -CMA scheduled [place] MFM OB Transvaginal for 04/10/2022 @ 7:45aAM. Patient is aware of date and time. -Patient will need Rhogam at her Initial OB appointment and she is aware she will be getting her 2 hours GTT labs at her next appointment.   Delivery Plan Plans to deliver at [facility]. Patient given information for [facility] website for more information [facility]. Patient is not interested in water birth. Offered upcoming OB visit with CNM to discuss further.  MyChart/Babyscripts MyChart access verified. I explained  pt will have some visits in office and some virtually. Babyscripts instructions given and order placed. Patient verifies receipt of registration text/e-mail. Account successfully created and app downloaded.  Blood Pressure Cuff/Weight Scale Patient is waiting for her medicaid to transfer over.   Anatomy US Explained first scheduled [place] will be around 19 weeks. Anatomy US scheduled for 04/29/2022 at 1:30PM. Pt notified to arrive at 1:15PM .  Labs -Patient had labs drawn at her care when she was in [place]. Patient filled out a ROI form to have her records send to [place].   Covid Vaccine Patient has not covid vaccine.   Is patient a CenteringPregnancy candidate?  Declined Declined due to Schedule/Times    Is patient a Mom+Baby Combined Care candidate?  Not a candidate     Social Determinants of Health Food Insecurity: Patient denies food insecurity. WIC Referral: Patient is interested in referral to [facility].  Transportation: Patient denies transportation needs. Childcare: Discussed no children allowed at ultrasound appointments. Offered childcare services; patient declines childcare services at this time.  First visit review I reviewed new OB appt with pt. I explained she will have a provider visit that includes 2 hours gtt third trimester labs and Rhogam. Explained pt will be seen by Vonzella Nipple PA-C at first visit; encounter routed to appropriate provider. Explained that patient will be seen by pregnancy navigator following visit with provider.   Vidal Schwalbe, [place] 04/09/2022  10:16 AM

## 2022-04-10 ENCOUNTER — Ambulatory Visit (INDEPENDENT_AMBULATORY_CARE_PROVIDER_SITE_OTHER): Payer: Medicaid Other

## 2022-04-10 ENCOUNTER — Other Ambulatory Visit: Payer: Self-pay | Admitting: *Deleted

## 2022-04-10 ENCOUNTER — Ambulatory Visit: Payer: Medicaid Other | Admitting: *Deleted

## 2022-04-10 ENCOUNTER — Ambulatory Visit: Payer: Medicaid Other | Attending: Medical

## 2022-04-10 VITALS — BP 108/61 | HR 79

## 2022-04-10 VITALS — BP 109/63 | HR 68 | Wt 133.0 lb

## 2022-04-10 DIAGNOSIS — O9A219 Injury, poisoning and certain other consequences of external causes complicating pregnancy, unspecified trimester: Secondary | ICD-10-CM

## 2022-04-10 DIAGNOSIS — O0990 Supervision of high risk pregnancy, unspecified, unspecified trimester: Secondary | ICD-10-CM

## 2022-04-10 DIAGNOSIS — Z6791 Unspecified blood type, Rh negative: Secondary | ICD-10-CM

## 2022-04-10 DIAGNOSIS — O99320 Drug use complicating pregnancy, unspecified trimester: Secondary | ICD-10-CM

## 2022-04-10 DIAGNOSIS — O26899 Other specified pregnancy related conditions, unspecified trimester: Secondary | ICD-10-CM | POA: Diagnosis not present

## 2022-04-10 DIAGNOSIS — Z3A20 20 weeks gestation of pregnancy: Secondary | ICD-10-CM | POA: Insufficient documentation

## 2022-04-10 MED ORDER — RHO D IMMUNE GLOBULIN 1500 UNIT/2ML IJ SOSY
300.0000 ug | PREFILLED_SYRINGE | Freq: Once | INTRAMUSCULAR | Status: AC
  Administered 2022-04-10: 300 ug via INTRAMUSCULAR

## 2022-04-10 NOTE — Progress Notes (Signed)
Patient here today to receive Rhogam. Rhogam administered without issue. I reviewed patient's next prenatal care appointment date and time with her. Patient verbalized understanding.   Alesia Richards, RN 04/10/22

## 2022-04-19 ENCOUNTER — Encounter: Payer: Self-pay | Admitting: *Deleted

## 2022-04-19 DIAGNOSIS — O0990 Supervision of high risk pregnancy, unspecified, unspecified trimester: Secondary | ICD-10-CM

## 2022-04-23 DIAGNOSIS — B20 Human immunodeficiency virus [HIV] disease: Secondary | ICD-10-CM

## 2022-04-23 DIAGNOSIS — Z21 Asymptomatic human immunodeficiency virus [HIV] infection status: Secondary | ICD-10-CM

## 2022-04-23 HISTORY — DX: Asymptomatic human immunodeficiency virus (hiv) infection status: Z21

## 2022-04-23 HISTORY — DX: Human immunodeficiency virus (HIV) disease: B20

## 2022-04-29 ENCOUNTER — Ambulatory Visit: Payer: MEDICAID

## 2022-04-30 ENCOUNTER — Telehealth: Payer: Self-pay | Admitting: Family Medicine

## 2022-04-30 ENCOUNTER — Telehealth: Payer: Self-pay

## 2022-04-30 ENCOUNTER — Other Ambulatory Visit (HOSPITAL_COMMUNITY): Payer: Self-pay

## 2022-04-30 NOTE — Telephone Encounter (Signed)
Patient is concerned about her health after getting some test results back, she would like a call back [phone]

## 2022-04-30 NOTE — Telephone Encounter (Signed)
Attempted to return patient's call. Patient did not answer. LM for patient to call the office at her convenience to discuss her results.

## 2022-04-30 NOTE — Telephone Encounter (Signed)
[facility] Patient Advocate Encounter ? ?Insurance verification completed.   ? ?The patient is uninsured and will need patient assistance for medication. ? ?We can complete the application and will need to meet with the patient for signatures and income documentation. ? ?Elvina Bosch, CPhT ?Specialty Pharmacy Patient Advocate ?[facility] ?Phone: [phone] ?Fax:  [phone]  ?

## 2022-05-01 ENCOUNTER — Ambulatory Visit (INDEPENDENT_AMBULATORY_CARE_PROVIDER_SITE_OTHER): Payer: Self-pay | Admitting: Pharmacist

## 2022-05-01 ENCOUNTER — Ambulatory Visit: Payer: Self-pay

## 2022-05-01 ENCOUNTER — Encounter: Payer: Self-pay | Admitting: Infectious Diseases

## 2022-05-01 ENCOUNTER — Ambulatory Visit (INDEPENDENT_AMBULATORY_CARE_PROVIDER_SITE_OTHER): Payer: Medicaid Other | Admitting: Infectious Diseases

## 2022-05-01 ENCOUNTER — Other Ambulatory Visit: Payer: Self-pay

## 2022-05-01 VITALS — BP 112/75 | HR 105 | Temp 97.8°F | Ht 62.0 in | Wt 135.0 lb

## 2022-05-01 DIAGNOSIS — Z21 Asymptomatic human immunodeficiency virus [HIV] infection status: Secondary | ICD-10-CM | POA: Insufficient documentation

## 2022-05-01 DIAGNOSIS — B20 Human immunodeficiency virus [HIV] disease: Secondary | ICD-10-CM | POA: Insufficient documentation

## 2022-05-01 DIAGNOSIS — O0990 Supervision of high risk pregnancy, unspecified, unspecified trimester: Secondary | ICD-10-CM | POA: Diagnosis not present

## 2022-05-01 MED ORDER — TIVICAY 50 MG PO TABS: 50.0000 mg | ORAL_TABLET | Freq: Every day | ORAL | 11 refills | Status: DC

## 2022-05-01 MED ORDER — EMTRICITABINE-TENOFOVIR AF 200-25 MG PO TABS: 1.0000 | ORAL_TABLET | Freq: Every day | ORAL | 11 refills | Status: DC

## 2022-05-01 NOTE — Patient Instructions (Signed)
It is very nice to meet you and I look forward to working with you.   Please START the two medications I gave you today  Always take them together (small yellow circle pill + larger oval white pill)  Always separate them by 6 hours from your prenatal vitamins   - Common side effects for a short time frame usually include headaches, nausea and diarrhea - OK to take over the counter tylenol for headaches and imodium for diarrhea - Try taking with food if you are nauseated   I will call you with your lab results when they return but would like to plan to see you back in 2 weeks so we can repeat your blood work and take care to get the medications sorted out for you - by then we should have your medication assistance approved.
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
## 2022-05-01 NOTE — Assessment & Plan Note (Addendum)
We discussed HIV care as it pertains to pregnancy. Discussed acute HIV infection during pregnancy is associated with higher vertical transmission risk (data declares ~3.4% vs < 1% in the setting of undetectable VL throughout pregnancy). She can deliver vaginally as long as other OB care is in alignment with this. Discussed she will be given IV zidovudine during labor to minimize the risk of transmission but the true benefit is getting Nicole Morgan on daily ARVs and get her as undetectable as soon as possible. We briefly discussed changes related to breastfeeding with women HIV+ - she would prefer to formula feed as she had a hard time with first child maintaining supply.   Would recommend a pap smear at some point with GYN team.  Counseled to separate HIV medication and prenatals / iron supplements by 6 hours to avoid malabsorption of ARVs.   No HIV test done with recent OB care encounter - I suspect this was omitted in error but will reach out to colleagues to clarify.

## 2022-05-01 NOTE — Progress Notes (Signed)
HPI: Nicole Morgan is a 26 y.o. female who presents to the [facility] clinic today to initiate care for a newly diagnosed HIV infection.  Patient Active Problem List   Diagnosis Date Noted   HIV test positive ([facility]) 05/01/2022   Cystic fibrosis carrier 04/09/2022   Supervision of high risk pregnancy, antepartum 04/09/2022   Polysubstance abuse ([facility]) 04/08/2021   Bipolar 1 disorder ([facility]) 04/08/2021   Closed fracture of one rib of left side    Chronic migraine without aura, with intractable migraine, so stated, with status migrainosus 11/10/2019    Patient's Medications  New Prescriptions   DOLUTEGRAVIR (TIVICAY) 50 MG TABLET    Take 1 tablet (50 mg total) by mouth daily.   EMTRICITABINE-TENOFOVIR AF (DESCOVY) 200-25 MG TABLET    Take 1 tablet by mouth daily.  Previous Medications   CETIRIZINE (ZYRTEC) 10 MG TABLET    Take 10 mg by mouth daily.   FERROUS SULFATE 325 (65 FE) MG TABLET    Take 325 mg by mouth 2 (two) times daily.   PRENATAL VIT-FE FUMARATE-FA (MULTIVITAMIN-PRENATAL) 27-0.8 MG TABS TABLET    Take 1 tablet by mouth daily at 12 noon.  Modified Medications   No medications on file  Discontinued Medications   No medications on file    Allergies: No Known Allergies  Past Medical History: Past Medical History:  Diagnosis Date   Cystic fibrosis carrier    Depression    no meds   Eczema    Incompetent cervix    UTI (urinary tract infection)     Social History: Social History   Socioeconomic History   Marital status: Single    Spouse name: Not on file   Number of children: 1   Years of education: Not on file   Highest education level: Some college, no degree  Occupational History   Not on file  Tobacco Use   Smoking status: Former    Packs/day: 0.25    Years: 1.00    Total pack years: 0.25    Types: E-cigarettes, Cigarettes    Quit date: 02/22/2012    Years since quitting: 10.1   Smokeless tobacco: Never  Vaping Use   Vaping Use: Former   Substances:  Nicotine, Flavoring  Substance and Sexual Activity   Alcohol use: Not Currently    Comment: 1-2 beers every now and then   Drug use: Yes    Types: Marijuana    Comment: last smoked 03/30/2022   Sexual activity: Yes  Other Topics Concern   Not on file  Social History Narrative   Lives with child    Right handed   Caffeine: about 3 cups/day maybe more   Social Determinants of Health   Financial Resource Strain: Not on file  Food Insecurity: Not on file  Transportation Needs: Not on file  Physical Activity: Not on file  Stress: Not on file  Social Connections: Not on file    Labs: No results found for: "HIV1RNAQUANT", "HIV1RNAVL", "CD4TABS"  RPR and STI Lab Results  Component Value Date   LABRPR Nonreactive 03/20/2022   LABRPR NON REAC 04/17/2014   LABRPR NON REACTIVE 10/16/2012   LABRPR Nonreactive 04/17/2012   LABRPR NON REACTIVE 01/21/2009   Hepatitis B Lab Results  Component Value Date   HEPBSAG Negative 03/20/2022   Hepatitis C No results found for: "HEPCAB", "HCVRNAPCRQN" Hepatitis A No results found for: "HAV" Lipids: No results found for: "CHOL", "TRIG", "HDL", "CHOLHDL", "VLDL", "LDLCALC"  Current HIV Regimen: Treatment  naive  Assessment: Nicole Morgan is here today to initiate care with Judeth Cornfield for her newly diagnosed HIV infection.  She is treatment naive with pending initial viral load and CD4 count.Will start patient on Tivicay with Truvada as we have samples of these here right now. Judeth Cornfield will follow up on confirmatory testing and update treatment as appropriate pending her HMAP approval.   Counseled patient that Truvada and Tivicay are both one pill once daily medications she can take with or without food. Discussed the importance of taking these medications daily to provide protection and decreased adherence is associated with decreased efficacy. Counseled on what to do if dose is missed, if closer to missed dose take immediately, if closer to next  dose then skip and resume normal schedule.Counseled patient that these are normally well tolerated, however some patients experience a "start up syndrome" with nausea, diarrhea, dizziness, and fatigue but that those should resolve soon after starting. Reviewed potential adverse events such as nephrotoxicity and bone density loss with Truvada. Advised that any nausea can be mitigated by taking it with food. I reviewed patient medications and found one drug interactions. She is taking prenatal vitamins, so we provided her with a pillbox so she can separate her HIV regimen and her prenatal vitamins. She will separate them by morning and evening   Plan: Start Tivicay and Truvada  Counseled patient on this regimen  Margarite Gouge, PharmD, CPP, BCIDP Clinical Pharmacist Practitioner Infectious Diseases Clinical Pharmacist [facility] 05/01/2022, 4:23 PM

## 2022-05-01 NOTE — Assessment & Plan Note (Signed)
Nicole Morgan is a 26 y.o. female here for evaluation after reactive preliminary HIV test. We discussed with HD today and no confirmatory results are back yet. I am not clear as to which tests are pending so will draw HIV RNA today. She had symptoms that seem consistent with acute HIV infection about 6 weeks ago and would presume she has detectable antibodies at this time.  She met with financial team for preliminary ADAP application. I was able to provide her samples of Tivicay and Truvada today to start taking while we await approval for rapid start.    I discussed with [name] treatment options/side effects, benefits of treatment and long-term outcomes. I discussed how HIV is transmitted and the process of untreated HIV including increased risk for opportunistic infections, cancer, dementia and renal failure. Patient was counseled on routine HIV care including medication adherence, blood monitoring, necessary vaccines and follow up visits. Counseled regarding safe sex practices including: condom use, partner disclosure, limiting partners. Patient spent time talking with our pharmacist Estill Bamberg regarding successful practices of ART and understands to reach out to our clinic in the future with questions.   Will call her with results and then have her back in 2-3 weeks to repeat VL to ensure it is coming down and finish collecting other blood work once we have confirmation she is HMAP approved.   Will continue with HIV education, integrated services we can offer and pregnancy specific information at our next visit in 2-3 weeks. She will also need prescriptions sent in to pharmacy then.  Will continue the tivicay and switch to Descovy once confirmed.

## 2022-05-02 ENCOUNTER — Ambulatory Visit: Payer: Self-pay

## 2022-05-02 NOTE — Progress Notes (Signed)
Patient did not keep appt for HIV lab test. Per chart review patient was seen yesterday by Infectious Disease. Will return for routine prenatal care.  Fleet Contras RN 05/02/22

## 2022-05-06 ENCOUNTER — Encounter: Payer: Self-pay | Admitting: Infectious Diseases

## 2022-05-06 LAB — HIV-1 RNA QUANT-NO REFLEX-BLD
HIV 1 RNA Quant: 2800 Copies/mL — ABNORMAL HIGH
HIV-1 RNA Quant, Log: 3.45 Log cps/mL — ABNORMAL HIGH

## 2022-05-13 ENCOUNTER — Other Ambulatory Visit: Payer: Self-pay

## 2022-05-13 DIAGNOSIS — O0990 Supervision of high risk pregnancy, unspecified, unspecified trimester: Secondary | ICD-10-CM

## 2022-05-14 ENCOUNTER — Other Ambulatory Visit: Payer: Self-pay

## 2022-05-14 ENCOUNTER — Ambulatory Visit (INDEPENDENT_AMBULATORY_CARE_PROVIDER_SITE_OTHER): Payer: Medicaid Other | Admitting: Infectious Diseases

## 2022-05-14 ENCOUNTER — Encounter: Payer: Self-pay | Admitting: Medical

## 2022-05-14 ENCOUNTER — Ambulatory Visit (INDEPENDENT_AMBULATORY_CARE_PROVIDER_SITE_OTHER): Payer: Medicaid Other | Admitting: Medical

## 2022-05-14 ENCOUNTER — Encounter: Payer: Self-pay | Admitting: Infectious Diseases

## 2022-05-14 VITALS — BP 112/72 | HR 102 | Wt 139.2 lb

## 2022-05-14 DIAGNOSIS — Z21 Asymptomatic human immunodeficiency virus [HIV] infection status: Secondary | ICD-10-CM

## 2022-05-14 DIAGNOSIS — Z141 Cystic fibrosis carrier: Secondary | ICD-10-CM

## 2022-05-14 DIAGNOSIS — O0990 Supervision of high risk pregnancy, unspecified, unspecified trimester: Secondary | ICD-10-CM

## 2022-05-14 DIAGNOSIS — Z23 Encounter for immunization: Secondary | ICD-10-CM

## 2022-05-14 DIAGNOSIS — Z3A27 27 weeks gestation of pregnancy: Secondary | ICD-10-CM

## 2022-05-14 DIAGNOSIS — O09292 Supervision of pregnancy with other poor reproductive or obstetric history, second trimester: Secondary | ICD-10-CM

## 2022-05-14 DIAGNOSIS — O0932 Supervision of pregnancy with insufficient antenatal care, second trimester: Secondary | ICD-10-CM | POA: Diagnosis not present

## 2022-05-14 DIAGNOSIS — F319 Bipolar disorder, unspecified: Secondary | ICD-10-CM | POA: Diagnosis not present

## 2022-05-14 DIAGNOSIS — Z6791 Unspecified blood type, Rh negative: Secondary | ICD-10-CM

## 2022-05-14 DIAGNOSIS — O26892 Other specified pregnancy related conditions, second trimester: Secondary | ICD-10-CM

## 2022-05-14 MED ORDER — EMTRICITABINE-TENOFOVIR AF 200-25 MG PO TABS: 1.0000 | ORAL_TABLET | Freq: Every day | ORAL | 11 refills | Status: DC

## 2022-05-14 MED ORDER — TIVICAY 50 MG PO TABS: 50.0000 mg | ORAL_TABLET | Freq: Every day | ORAL | 11 refills | Status: DC

## 2022-05-14 NOTE — Progress Notes (Signed)
   PRENATAL VISIT NOTE  Subjective:  Nicole Morgan is a 26 y.o. G2P1001 at [redacted]w[redacted]d being seen today for ongoing prenatal care.  [place] She is currently monitored for the following issues for this high-risk pregnancy and has Polysubstance abuse (HCC); Bipolar 1 disorder (HCC); Cystic fibrosis carrier; Supervision of high risk pregnancy, antepartum; HIV test positive (HCC); and History of cervical incompetence in pregnancy, currently pregnant, second trimester on their problem list.  Patient reports no complaints.  Contractions: Not present. Vag. Bleeding: None.  Movement: Present. Denies leaking of fluid.   The following portions of the patient's history were reviewed and updated as appropriate: allergies, current medications, past family history, past medical history, past social history, past surgical history and problem list.   Objective:   Vitals:   05/14/22 0852  BP: 112/72  Pulse: (!) 102  Weight: 139 lb 3.2 oz (63.1 kg)    Fetal Status: Fetal Heart Rate (bpm): 151 Fundal Height: 26 cm Movement: Present     General:  Alert, oriented and cooperative. Patient is in no acute distress.  Skin: Skin is warm and dry. No rash noted.   Cardiovascular: Normal heart rate and rhythm noted  Respiratory: Normal respiratory effort, no problems with respiration noted, clear to auscultation   Abdomen: Soft, gravid, appropriate for gestational age.  Pain/Pressure: Absent     Pelvic: Cervical exam deferred         Extremities: Normal range of motion.  Edema: None  Mental Status: Normal mood and affect. Normal behavior. Normal judgment and thought content.   Assessment and Plan:  Pregnancy: G2P1001 at [redacted]w[redacted]d 1.  Supervision of high risk pregnancy, antepartum - Culture, OB Urine - CHL AMB BABYSCRIPTS SCHEDULE OPTIMIZATION - PANORAMA PRENATAL TEST FULL PANEL - HORIZON 4 (SMA, CF, FRAGILE X, DMD) - Tdap vaccine greater than or equal to 7yo IM - CBC - RPR - Desires  BTL, MCD not active yet, advised to bring card ASAP to sign consent   3. HIV test positive (HCC) - Diagnosed 04/2022 - Sees ID today for viral load   4. Cystic fibrosis carrier - From last pregnancy, FOB is not available for testing   5. Bipolar 1 disorder (HCC) - Has established care   6. [redacted] weeks gestation of pregnancy  7. History of cervical incompetence in pregnancy, currently pregnant, second trimester - Funneling at 19 weeks with previous pregnancy, cerclage placed, bed rest and IOL at 39 weeks, SVD - [place] scheduled 8/30  8. Rh Negative  - Rhogam given at 22 weeks   Preterm labor symptoms and general obstetric precautions including but not limited to vaginal bleeding, contractions, leaking of fluid and fetal movement were reviewed in detail with the patient. Please refer to After Visit Summary for other counseling recommendations.   Return in about 2 weeks (around 05/28/2022) for [facility] MD only, In-Person.  Future Appointments  Date Time Provider Department Center  05/14/2022 10:30 AM Blanchard Kelch, NP [facility]  05/17/2022  8:20 AM [facility]  05/22/2022 12:30 PM [facility] NURSE [facility]  05/22/2022 12:45 PM [facility] US4 [facility]    Vonzella Nipple, PA-C

## 2022-05-14 NOTE — Progress Notes (Signed)
Name: Nicole Morgan  DOB: [birth year] MRN: [id number] PCP: Ferd Hibbs, NP     Brief Narrative:  Nicole Morgan is a 26 y.o. female with presumptively positive HIV diagnosis 04/29/22 from [facility] as part of contact tracing event HIV Risk: sexual History of OIs:  Intake Labs: Hep B sAg (-), sAb (), cAb (); Hep A (), Hep C () Quantiferon () HLA B*5701 () G6PD: ()   Previous Regimens: Tivicay + Descovy   Genotypes: Not collected.   Subjective:   Chief Complaint  Patient presents with   Follow-up      HPI: Nicole Morgan is here with a friend for 2 week follow up. She has been taking the [place] everyday since our first visit. She still finds that she has fits of nausea in the morning before she takes her medication as well as diarrhea around 4-5 am. Needs to have coffee or something on the stomach prior to taking medications.   H/O bipolar disorder not currently on medications d/t pregnancy. She says she has had swings of her mood but struggles with PTSD/nightmares as well as fluctuating mood. She is open to psychiatry / counselor.   27w gestation today and received TDap.       05/14/2022    9:27 AM  Depression screen PHQ 2/9  Decreased Interest 2  Down, Depressed, Hopeless 2  PHQ - 2 Score 4  Altered sleeping 1  Tired, decreased energy 1  Change in appetite 0  Feeling bad or failure about yourself  3  Trouble concentrating 1  Moving slowly or fidgety/restless 1  Suicidal thoughts 1  PHQ-9 Score 12     Review of Systems  Constitutional:  Negative for chills, fever, malaise/fatigue and weight loss.  HENT:  Negative for sore throat.   Respiratory:  Negative for cough, sputum production and shortness of breath.   Cardiovascular: Negative.   Gastrointestinal:  Negative for abdominal pain, diarrhea and vomiting.  Musculoskeletal:  Negative for joint pain, myalgias and neck pain.  Skin:  Negative for rash.  Neurological:  Negative for headaches.   Psychiatric/Behavioral:  Negative for depression and substance abuse. The patient is not nervous/anxious.      Past Medical History:  Diagnosis Date   Cystic fibrosis carrier    Depression    no meds   Eczema    Incompetent cervix    UTI (urinary tract infection)     Outpatient Medications Prior to Visit  Medication Sig Dispense Refill   cetirizine (ZYRTEC) 10 MG tablet Take 10 mg by mouth daily.     ferrous sulfate 325 (65 FE) MG tablet Take 325 mg by mouth 2 (two) times daily.     Prenatal Vit-Fe Fumarate-FA (MULTIVITAMIN-PRENATAL) 27-0.8 MG TABS tablet Take 1 tablet by mouth daily at 12 noon.     dolutegravir (TIVICAY) 50 MG tablet Take 1 tablet (50 mg total) by mouth daily. 30 tablet 11   emtricitabine-tenofovir AF (DESCOVY) 200-25 MG tablet Take 1 tablet by mouth daily. 30 tablet 11   No facility-administered medications prior to visit.     No Known Allergies  Social History   Tobacco Use   Smoking status: Every Day    Types: E-cigarettes   Smokeless tobacco: Never  Vaping Use   Vaping Use: Former   Substances: Nicotine, Flavoring  Substance Use Topics   Alcohol use: Not Currently    Comment: 1-2 beers every now and then   Drug use: Yes    Types:  Marijuana    Comment: last smoked 03/30/2022    Family History  Problem Relation Age of Onset   Bipolar disorder Mother        schizophrenic   Mental illness Mother        schizophrenia   Deep vein thrombosis Maternal Aunt    Alcohol abuse Maternal Grandmother    Mental illness Maternal Grandmother    Bipolar disorder Maternal Grandmother        schizophrenic    Cancer Maternal Grandmother        lymph nodes   Migraines Neg Hx    Headache Neg Hx     Social History   Substance and Sexual Activity  Sexual Activity Yes     Objective:   Vitals:   05/14/22 1037  BP: 104/71  Pulse: 93  Temp: 97.8 F (36.6 C)  TempSrc: Oral  SpO2: 100%  Weight: 139 lb (63 kg)   Body mass index is 25.42  kg/m.  Physical Exam Vitals reviewed.  Constitutional:      Appearance: She is well-developed.     Comments: Seated comfortably in chair.   HENT:     Mouth/Throat:     Mouth: No oral lesions.     Dentition: Normal dentition. No dental abscesses.     Pharynx: No oropharyngeal exudate.  Cardiovascular:     Rate and Rhythm: Normal rate and regular rhythm.     Heart sounds: Normal heart sounds.  Pulmonary:     Effort: Pulmonary effort is normal.     Breath sounds: Normal breath sounds.  Abdominal:     General: There is no distension.     Palpations: Abdomen is soft.     Tenderness: There is no abdominal tenderness.     Comments: Pregnancy   Lymphadenopathy:     Cervical: No cervical adenopathy.  Skin:    General: Skin is warm and dry.     Findings: No rash.  Neurological:     Mental Status: She is alert and oriented to person, place, and time.  Psychiatric:        Judgment: Judgment normal.     Lab Results Lab Results  Component Value Date   WBC 13.4 (H) 04/07/2021   HGB 12.5 03/20/2022   HCT 38 03/20/2022   MCV 87.8 04/07/2021   PLT 295 03/20/2022    Lab Results  Component Value Date   CREATININE 0.54 04/01/2022   BUN <5 (L) 04/01/2022   NA 136 04/01/2022   K 3.7 04/01/2022   CL 103 04/01/2022   CO2 22 04/01/2022    Lab Results  Component Value Date   ALT 22 04/07/2021   AST 39 04/07/2021   ALKPHOS 72 04/07/2021   BILITOT 0.7 04/07/2021    No results found for: "CHOL", "HDL", "LDLCALC", "LDLDIRECT", "TRIG", "CHOLHDL" HIV 1 RNA Quant (Copies/mL)  Date Value  05/01/2022 2,800 (H)     Assessment & Plan:   Problem List Items Addressed This Visit       Unprioritized   HIV (human immunodeficiency virus infection) ([place])    Confirmatory RNA > 2000 and Ab from HD positive.  She has been experiencing some side effects with the tivicay and truvada. Now that ADAP has been approved will switch truvada to descovy for smaller pill and hopefully better nausea  profile.   Will continue taking tivicay + descovy together daily. Repeat VL today --> suspect she is undetectable at this point. Will get CD4. Hep B sAg neg -  Hep B sAb collected today. Hep C neg. RPR pending from today but recently at HD 2 weeks ago negative.   Will have her back in 2 m around 35/36 weeks to repeat VL. Encouraged her to reach out to me if anything comes up between visits. Suggested flu vaccine as well in the fall when available. She would prefer to bottle feed her child. Hopeful she can continue to have vaginal delivery. Seems her pregnancy is going well and from ID perspective as long as she has suppressed viral load she would be a good candidate for this.       Relevant Medications   dolutegravir (TIVICAY) 50 MG tablet   emtricitabine-tenofovir AF (DESCOVY) 200-25 MG tablet   Other Relevant Orders   HIV 1 RNA quant-no reflex-bld   T-helper cells (CD4) count   Hepatitis B surface antibody,qualitative   Bipolar 1 disorder (HCC)    Mood has been down and PHQ screening > 10.  She will look to schedule a visit with our clinic counselor or alternatively [facility] has counseling team available as well.        Janene Madeira, MSN, NP-C [facility] [place] Pager: [phone] Office: [phone]  05/14/22  4:21 PM

## 2022-05-14 NOTE — Patient Instructions (Signed)
  Schedule with Okey Regal at your earliest ability (next Tuesday hopefully)

## 2022-05-14 NOTE — Assessment & Plan Note (Signed)
Confirmatory RNA > 2000 and Ab from HD positive.  She has been experiencing some side effects with the tivicay and truvada. Now that ADAP has been approved will switch truvada to descovy for smaller pill and hopefully better nausea profile.   Will continue taking tivicay + descovy together daily. Repeat VL today --> suspect she is undetectable at this point. Will get CD4. Hep B sAg neg - Hep B sAb collected today. Hep C neg. RPR pending from today but recently at HD 2 weeks ago negative.   Will have her back in 2 m around 35/36 weeks to repeat VL. Encouraged her to reach out to me if anything comes up between visits. Suggested flu vaccine as well in the fall when available. She would prefer to bottle feed her child. Hopeful she can continue to have vaginal delivery. Seems her pregnancy is going well and from ID perspective as long as she has suppressed viral load she would be a good candidate for this.

## 2022-05-14 NOTE — Patient Instructions (Signed)
AREA PEDIATRIC/FAMILY PRACTICE PHYSICIANS  [place] ([address]) [facility] Chambliss, MD; Eniola, MD; Hale, MD; Hensel, MD; McDiarmid, MD; McIntyer, MD; Neal, MD; Walden, MD [address] [place], [place] [address] [phone] Mon-Fri 8:30-12:30, 1:30-5:00 Providers come to see babies at [facility] Accepting Medicaid [facility] providers who accept newborns: Koirala, MD; Morrow, MD; Wolters, MD [address], [place], [place] [address] [phone] Mon-Fri 8:00-5:30 Babies seen by providers at [facility] Does NOT accept Medicaid Please call early in hospitalization for appointment (limited availability)  [facility] Mulberry, MD [address] [place], [place] [address] [phone] Mon, Tue, Thur, Fri 8:30-5:00, Wed 10:00-7:00 (closed 1-2pm) Babies seen by [facility] providers Accepting Medicaid Rubin - Pediatrician Rubin, MD [address], [place], [place] [address] [phone] Mon-Fri 8:30-5:00, Sat 8:30-12:00 Provider comes to see babies at [facility] Accepting Medicaid Must have been referred from current patients or contacted office prior to delivery [facility] ([facility]) Brown, MD; Chandler, MD; Ettefagh, MD; Grant, MD; Lester, MD; McCormick, MD; McQueen, MD; Prose, MD; Simha, MD; Stanley, MD; Stryffeler, NP; Tebben, NP [address], [place], [place] [address] [phone] Mon, Tue, Thur, Fri 8:30-5:30, Wed 9:30-5:30, Sat 8:30-12:30 Babies seen by [facility] providers Accepting Medicaid Only accepting infants of first-time parents or siblings of current patients Hospital discharge coordinator will make follow-up appointment Jack Amos [address], [place], [place]  [address] [phone]   Fax - [phone] [facility] [address], [place], [place]  [address] Phone - [phone]   Fax - [phone] Shilpa Gosrani [address], [place], [place]  [address] [phone]  [place] ([address]) [facility] Bates, MD; Brassfield, MD; Cooper, Cox, MD; MD; Davis, MD; Dovico, MD; Ettefaugh, MD; Little, MD; Lowe, MD; Keiffer, MD; Melvin, MD; Sumner, MD; [place], MD [address], [place], [place] [address] [phone] Mon-Fri 8:30-5:00 (extended evenings Mon-Thur as needed), Sat-Sun 10:00-1:00 Providers come to see babies at [facility] Accepting Medicaid for families of first-time babies and families with all children in the household age 3 and under. Must register with office prior to making appointment (M-F only). [facility] Henson, NP; Knapp, MD; Lalonde, MD; Tysinger, PA [address] [place], [place] [address] [phone] Mon-Fri 8:00-5:00 Babies seen by providers at [facility] Does NOT accept Medicaid/Commercial Insurance Only [facility] ([facility])  Artis, MD; Barnes, MD; Bratton, MD; Coccaro, MD; Lockett Gardner, MD; Kramer, MD; Marshall, MD; Netherton, MD; Poleto, MD; Skinner, MD [address] [place], [place] [address] [phone] Mon-Fri 8:30-5:30, Sat (Oct.-Mar.) 9:00-1:00 Babies seen by providers at [facility] Accepting Medicaid  [place] ([address]) [facility] Reid, MD; Warner, MD [address], [place], [place] [address] [phone] Mon-Fri 8:30-5:00, Sat 8:30-12:00 Providers come to see babies at [facility] Does NOT accept Medicaid [facility] Becker, PA; Hagler, MD; Scifres, PA; Sun, MD; Swayne, MD [address], [place], [place] [address] [phone] Mon-Fri 8:00-5:00 Babies seen by providers at [facility] Does NOT accept Medicaid Only accepting babies of parents who  are patients Please call early in hospitalization for appointment (limited availability) [facility] Clark, MD; Frye, MD; Kelleher, MD; Mack, NP; Miller, MD; O'Keller, MD; Patterson, NP; Pudlo, MD; Puzio, MD; Thomas, MD; Tucker, MD; Twiselton, MD [address], [place], [place] [address] [phone] Mon-Fri 8:00-5:00, Sat 9:00-12:00 Providers come to see babies at [facility] Does NOT accept Medicaid  [place] ([address]) [facility] providers accepting new patients: Brake, NP; Wharton, PA [address], [place], [place] [address] [phone] Mon-Fri 8:00-5:00 Babies seen by providers at [facility] Does NOT accept Medicaid Only accepting babies of parents who are patients Please call early in hospitalization for appointment (limited availability) [facility] Gay, MD; Quinlan, MD [address] [place], [place] [address] [phone] (press 1 to schedule appointment) Mon-Fri 8:00-5:00 Providers come to see babies at [facility] Does NOT accept Medicaid [facility] Mazer, MD [address] [place], [place] [address] [phone] Mon-Fri 8:30-5:00 (lunch 12:30-1:00), extended hours by appointment only Wed 5:00-6:30 Babies seen by [facility] providers Accepting Medicaid [facility] Banks, MD; Jordan, MD; Koberlein, MD [address], [place], [place] [address] [phone] Mon-Fri 8:00-5:00 Babies seen by [facility] providers Does NOT accept Medicaid [facility] Parker, MD; Hunter, MD; Wallace, DO [address] [place], [place] [address] [phone] Mon-Fri 8:00-5:00 Babies seen by [facility] providers Does NOT accept Medicaid [facility] Brandon, PA; Brecken, PA; Christy, NP; Dees, MD; DeClaire, MD; DeWeese, MD; Hansen, NP; Mills, NP; Parrish, NP; Smoot, NP; Corneisha, MD; Vapne,  MD [address] [place], [place] [address] [phone] Mon-Fri 8:30-5:00, Sat 10:00-1:00 Providers come to see babies at [facility] Does NOT accept Medicaid Free prenatal information session Tuesdays at 4:45pm [facility] Bouska, MD; [place], PA; Jeffery, PA; Weber, PA [address] [place] [address] [phone] Mon-Fri 7:30-5:30 Babies seen by [facility] providers [facility] [address], [place], [place]  [address] [phone]   Fax - [phone]  [place] ([address] & [address]) [facility] [place] [address] [place], [place] [address] [phone] Mon-Thur 8:00-6:00 Providers come to see babies at [facility] Accepting Medicaid [facility] Anderson, NP; Badger, MD; Beal, PA; [place], PA [address] [place], [place] [address] [phone] Mon-Thur 7:30-7:30, Fri 7:30-4:30 Babies seen by [facility] providers Accepting Medicaid [facility] Agbuya, MD; Klett, NP; Romgoolam, MD [address], [place], [place] [address] [phone] Mon-Fri 8:30-5:00, Sat 8:30-12:00 Providers come to see babies at [facility] Accepting Medicaid Must have "Meet & Greet" appointment at office prior to delivery [facility] ([facility]) McCord, MD; Wallace, MD; Wood, MD [address], [place], [place] [address] [phone] Mon-Wed 8:00-6:00, Thur-Fri 8:00-5:00, Sat 9:00-12:00 Providers come to see babies at [facility] Does NOT accept Medicaid Only accepting siblings of current patients [facility]  [address], [place], [place]  [address] [phone]   Fax - [phone] [facility] [address], [place], [place]  [address] [phone]   Fax -  [phone]  [place] ([address] & [address]) [facility] Cirigliano, DO; Matthews, DO [address] [place],  [address] [phone] Mon-Fri 7:00-5:00 Babies seen by [facility] providers Does NOT accept Medicaid [facility] Briscoe, MD; Howley, PA; Moreira, PA [address], [place],  [address] [phone] Mon-Fri 8:00-5:00 Babies seen by [facility] providers Accepting Medicaid [facility] Boyd, MD; Church, PA; Jones, NP; Osborn, PA [address], [place],  [address] [phone] Mon-Fri 8:00-5:00 Babies seen by providers at [facility] Accepting Medicaid  [place] ([address]) [facility] [place] Wendling, DO [address] [place], [place] [address] [phone] Mon-Fri 8:00-5:00 Babies seen by [facility] providers Does NOT accept Medicaid Limited availability, please call early in hospitalization to schedule follow-up [facility] Calderon, PA; Cummings, MD; Dillard, MD; Martin, PA; Olson, MD; VanDeven, PA [address], [place], [place] [address] [phone] Mon-Fri 8:30-5:00, Sat 9:00-12:00 Babies seen by providers at [facility] Accepting Medicaid Please register online then schedule online or call office [email or web address] Hunter, NP; Kumar, MD; Martin Rogers, PA [address], [place], [place] [address] [phone] Mon-Fri 8:00-5:00 Babies seen by providers at [facility] Accepting Medicaid [facility] ([facility]) [place], MD; Kristi Fleenor, NP; [place], MD [address], [place], [place] [address] [phone] Mon-Fri 8:00-5:30, Sat&Sun by appointment (phones open at  8:30) Babies seen by [facility] providers Accepting Medicaid Must be a first-time baby or sibling of current patient [facility] [place]  [address], [place], [place]  [address] [phone]   Fax - [phone]  [place] ([address]) [facility] Brown, PA; Cowen, PA; Rice, MD; Helton, PA; Spry, MD [address] [place], [place] [address] [phone] Mon-Thur 8:00-7:00, Fri 8:00-5:00, Sat 8:00-12:00, Sun 9:00-12:00 Babies seen by [facility] providers Accepting Medicaid [facility] Coe-Goins, MD; [place], MD; Pierre-Louis, MD [address], [place], [place] [address] [phone] Mon-Thur 8:00-5:00 Babies seen by providers at [facility] Accepting Medicaid [facility] Bratton, MD; Coe-Goins, MD; Hayes, MD; Lewis, MD; List, MD; Lott, MD; Marshall, MD; Moran, MD; O'Neal, MD; Pierre-Louis, MD; Pitonzo, MD; Scholer, MD; Spangle, MD [address] [place], [place] [address] [phone] Mon-Fri 8:00-5:30, Sat (Oct.-Mar.) 9:00-1:00 Babies seen by providers at [facility] Accepting Medicaid Must fill out new patient packet, available online at [email or web address] [facility] ([facility]) Friddle, NP; Harris, NP; Kelly, NP; Logan, MD; Melvin, PA; Poth, MD; Ramadoss, MD; [place], NP [address], [place], [place] [address] [phone] Mon-Thur 8:00-5:30, Fri 8:00-5:00 Babies seen by providers at [facility] Accepting Medicaid  [place] ([address]) [facility] Dixon, PA; , MD; Pickard, MD; Tapia, PA [address], [place], [place] [address] [phone] Mon-Fri 8:00-5:00 Babies seen by providers at [facility] Accepting Medicaid   [place] ([address]) [facility] [place] Masneri, DO; Meyers, MD; Nelson, PA [address], [place], [place] [address] [phone] Mon-Fri 8:00-5:00 Babies seen by providers at [facility] Does NOT accept Medicaid Limited appointment availability, please call early in hospitalization  [facility] [place] Kunedd, DO; McGowen, MD [address], [place], [place] [address] [phone] Mon-Fri 8:00-5:00 Babies seen by [facility] providers Does NOT accept Medicaid [facility] [place] Cameron, MD; MacDonald, MD; Michaels, PA; Nayak, MD [address] [place] [address], [place], [place] [address] [phone] Mon-Fri 8:00-5:00 After hours clinic ([address] [place],  [address]) [phone] Mon-Fri 5:00-8:00, Sat 12:00-6:00, Sun 10:00-4:00 Babies seen by [facility] providers Accepting Medicaid [facility] [place] [address], [place], [place]  [address] [phone]   Fax - [phone]  [place] ([address]) [facility] [place] [facility] Andy, MD [address], [place], [place] [address] [phone] Mon-Fri 8:00-5:00 Babies seen by [facility] providers Does NOT accept Medicaid [facility] [place] ([facility] [place]) Eksir, MD [address], [place], [place] [address] [phone] Mon-Thur 8:00-7:00, Fri 8:00-5:00, Sat 8:00-12:00 Babies seen by providers at [facility] Accepting Medicaid - but does not have vaccinations in office (must be received elsewhere) Limited availability, please call early in hospitalization  [place] ([address]) [facility]  Charlene Flemming, MD [address], [place] [address] [phone]  Fax [phone]  [facility]  Kimberly Newton, MD, Annamarie Streilein, PA, Carla Hampton, PA [address] [place] [place], [place]  [address] [phone] [facility]  [address], [place], [place] [address] [phone] [address], [place], [place] [address] [phone] (West Office)  [facility] [address], [place], [place] [address] [phone] [facility] [address], [place], [place] [address] [phone] [facility] [address], [place], [place] [address] [phone] [facility] [address], [place], [place] [address] [phone] [facility] [address], [place], [place] [address] [phone] [facility] [address], [place], [place] [address] [phone] [facility]  [address], [place], [place] [address] [phone] [facility] [address] [place], [place], [place] [address] [phone] [facility] [address], [place], [place] [address] [phone] [facility] [address], [place], [place] [address] [phone]  

## 2022-05-14 NOTE — Addendum Note (Signed)
Addended by: Aviva Signs on: 05/14/2022 09:24 AM   Modules accepted: Orders

## 2022-05-14 NOTE — Assessment & Plan Note (Signed)
Mood has been down and PHQ screening > 10.  She will look to schedule a visit with our clinic counselor or alternatively [facility] has counseling team available as well.

## 2022-05-14 NOTE — Progress Notes (Signed)
Positive PHQ-9 and GAD-7 screening, Patient is currently seeing [facility] for her depression and anxiety   Wynona Canes, CMA

## 2022-05-15 ENCOUNTER — Ambulatory Visit: Payer: Self-pay | Admitting: Infectious Diseases

## 2022-05-15 LAB — CBC
Hematocrit: 34.5 % (ref 34.0–46.6)
Hemoglobin: 11.6 g/dL (ref 11.1–15.9)
MCH: 30.8 pg (ref 26.6–33.0)
MCHC: 33.6 g/dL (ref 31.5–35.7)
MCV: 92 fL (ref 79–97)
Platelets: 377 10*3/uL (ref 150–450)
RBC: 3.77 x10E6/uL (ref 3.77–5.28)
RDW: 12.6 % (ref 11.7–15.4)
WBC: 11.9 10*3/uL — ABNORMAL HIGH (ref 3.4–10.8)

## 2022-05-15 LAB — RPR: RPR Ser Ql: NONREACTIVE

## 2022-05-15 LAB — T-HELPER CELLS (CD4) COUNT (NOT AT ARMC)
CD4 % Helper T Cell: 54 % (ref 33–65)
CD4 T Cell Abs: 1287 /uL (ref 400–1790)

## 2022-05-16 ENCOUNTER — Other Ambulatory Visit: Payer: Self-pay

## 2022-05-16 DIAGNOSIS — O0990 Supervision of high risk pregnancy, unspecified, unspecified trimester: Secondary | ICD-10-CM

## 2022-05-16 LAB — CULTURE, OB URINE

## 2022-05-16 LAB — URINE CULTURE, OB REFLEX

## 2022-05-17 ENCOUNTER — Other Ambulatory Visit: Payer: Self-pay

## 2022-05-17 DIAGNOSIS — O0990 Supervision of high risk pregnancy, unspecified, unspecified trimester: Secondary | ICD-10-CM

## 2022-05-17 LAB — HIV-1 RNA QUANT-NO REFLEX-BLD
HIV 1 RNA Quant: 47 Copies/mL — ABNORMAL HIGH
HIV-1 RNA Quant, Log: 1.67 Log cps/mL — ABNORMAL HIGH

## 2022-05-17 LAB — HEPATITIS B SURFACE ANTIBODY,QUALITATIVE: Hep B S Ab: NONREACTIVE

## 2022-05-20 LAB — PANORAMA PRENATAL TEST FULL PANEL:PANORAMA TEST PLUS 5 ADDITIONAL MICRODELETIONS: FETAL FRACTION: 18.7

## 2022-05-21 LAB — CBC
Hematocrit: 32 % — ABNORMAL LOW (ref 34.0–46.6)
Hemoglobin: 10.8 g/dL — ABNORMAL LOW (ref 11.1–15.9)
MCH: 30.7 pg (ref 26.6–33.0)
MCHC: 33.8 g/dL (ref 31.5–35.7)
MCV: 91 fL (ref 79–97)
Platelets: 359 10*3/uL (ref 150–450)
RBC: 3.52 x10E6/uL — ABNORMAL LOW (ref 3.77–5.28)
RDW: 12.3 % (ref 11.7–15.4)
WBC: 10 10*3/uL (ref 3.4–10.8)

## 2022-05-21 LAB — HIV 1/2 AB DIFFERENTIATION
HIV 1 Ab: REACTIVE
HIV 2 Ab: NONREACTIVE
NOTE (HIV CONF MULTIP: POSITIVE — AB

## 2022-05-21 LAB — GLUCOSE TOLERANCE, 2 HOURS W/ 1HR
Glucose, 1 hour: 138 mg/dL (ref 70–179)
Glucose, 2 hour: 98 mg/dL (ref 70–152)
Glucose, Fasting: 86 mg/dL (ref 70–91)

## 2022-05-21 LAB — RPR: RPR Ser Ql: NONREACTIVE

## 2022-05-21 LAB — HIV ANTIBODY (ROUTINE TESTING W REFLEX): HIV Screen 4th Generation wRfx: REACTIVE

## 2022-05-22 ENCOUNTER — Ambulatory Visit: Payer: Medicaid Other | Attending: Obstetrics and Gynecology

## 2022-05-22 ENCOUNTER — Ambulatory Visit: Payer: Medicaid Other | Admitting: *Deleted

## 2022-05-22 ENCOUNTER — Other Ambulatory Visit: Payer: Self-pay | Admitting: *Deleted

## 2022-05-22 VITALS — BP 110/71 | HR 92

## 2022-05-22 DIAGNOSIS — O0933 Supervision of pregnancy with insufficient antenatal care, third trimester: Secondary | ICD-10-CM

## 2022-05-22 DIAGNOSIS — O0990 Supervision of high risk pregnancy, unspecified, unspecified trimester: Secondary | ICD-10-CM | POA: Diagnosis present

## 2022-05-22 DIAGNOSIS — O99320 Drug use complicating pregnancy, unspecified trimester: Secondary | ICD-10-CM | POA: Insufficient documentation

## 2022-05-22 DIAGNOSIS — O99323 Drug use complicating pregnancy, third trimester: Secondary | ICD-10-CM

## 2022-05-22 DIAGNOSIS — O09292 Supervision of pregnancy with other poor reproductive or obstetric history, second trimester: Secondary | ICD-10-CM

## 2022-05-22 DIAGNOSIS — O09293 Supervision of pregnancy with other poor reproductive or obstetric history, third trimester: Secondary | ICD-10-CM

## 2022-05-22 DIAGNOSIS — F1910 Other psychoactive substance abuse, uncomplicated: Secondary | ICD-10-CM

## 2022-05-22 DIAGNOSIS — O285 Abnormal chromosomal and genetic finding on antenatal screening of mother: Secondary | ICD-10-CM

## 2022-05-22 DIAGNOSIS — O98713 Human immunodeficiency virus [HIV] disease complicating pregnancy, third trimester: Secondary | ICD-10-CM

## 2022-05-22 DIAGNOSIS — B20 Human immunodeficiency virus [HIV] disease: Secondary | ICD-10-CM

## 2022-05-22 DIAGNOSIS — O09893 Supervision of other high risk pregnancies, third trimester: Secondary | ICD-10-CM

## 2022-05-22 DIAGNOSIS — Z141 Cystic fibrosis carrier: Secondary | ICD-10-CM

## 2022-05-22 DIAGNOSIS — Z3689 Encounter for other specified antenatal screening: Secondary | ICD-10-CM

## 2022-05-22 DIAGNOSIS — Z3A28 28 weeks gestation of pregnancy: Secondary | ICD-10-CM

## 2022-05-29 ENCOUNTER — Encounter: Payer: Self-pay | Admitting: Infectious Diseases

## 2022-06-07 ENCOUNTER — Ambulatory Visit (INDEPENDENT_AMBULATORY_CARE_PROVIDER_SITE_OTHER): Payer: Medicaid Other | Admitting: Family Medicine

## 2022-06-07 ENCOUNTER — Other Ambulatory Visit: Payer: Self-pay

## 2022-06-07 VITALS — BP 116/71 | HR 84 | Wt 141.0 lb

## 2022-06-07 DIAGNOSIS — Z23 Encounter for immunization: Secondary | ICD-10-CM | POA: Diagnosis not present

## 2022-06-07 DIAGNOSIS — F319 Bipolar disorder, unspecified: Secondary | ICD-10-CM

## 2022-06-07 DIAGNOSIS — Z6791 Unspecified blood type, Rh negative: Secondary | ICD-10-CM

## 2022-06-07 DIAGNOSIS — O26892 Other specified pregnancy related conditions, second trimester: Secondary | ICD-10-CM

## 2022-06-07 DIAGNOSIS — O0990 Supervision of high risk pregnancy, unspecified, unspecified trimester: Secondary | ICD-10-CM

## 2022-06-07 DIAGNOSIS — Z21 Asymptomatic human immunodeficiency virus [HIV] infection status: Secondary | ICD-10-CM

## 2022-06-07 NOTE — Progress Notes (Signed)
   PRENATAL VISIT NOTE  Subjective:  Nicole Morgan is a 26 y.o. G2P1001 at [redacted]w[redacted]d being seen today for ongoing prenatal care.  She is currently monitored for the following issues for this low-risk pregnancy and has Polysubstance abuse (HCC); Bipolar 1 disorder (HCC); Cystic fibrosis carrier; Supervision of high risk pregnancy, antepartum; HIV (human immunodeficiency virus infection) (HCC); History of cervical incompetence in pregnancy, currently pregnant, second trimester; and Rh negative state in antepartum period, second trimester on their problem list.  Patient reports no complaints.  Contractions: Irritability. Vag. Bleeding: None.  Movement: Present. Denies leaking of fluid.   The following portions of the patient's history were reviewed and updated as appropriate: allergies, current medications, past family history, past medical history, past social history, past surgical history and problem list.   Objective:   Vitals:   06/07/22 0935  BP: 116/71  Pulse: 84  Weight: 141 lb (64 kg)    Fetal Status: Fetal Heart Rate (bpm): 154 Fundal Height: 29 cm Movement: Present     General:  Alert, oriented and cooperative. Patient is in no acute distress.  Skin: Skin is warm and dry. No rash noted.   Cardiovascular: Normal heart rate noted  Respiratory: Normal respiratory effort, no problems with respiration noted  Abdomen: Soft, gravid, appropriate for gestational age.  Pain/Pressure: Present     Pelvic: Cervical exam deferred        Extremities: Normal range of motion.  Edema: None  Mental Status: Normal mood and affect. Normal behavior. Normal judgment and thought content.   Assessment and Plan:  Pregnancy: G2P1001 at [redacted]w[redacted]d 1. Need for immunization against influenza Flu shot today - Flu Vaccine QUAD 75mo+IM (Fluarix, Fluzone & Alfiuria Quad PF)  2. Supervision of high risk pregnancy, antepartum   3. Asymptomatic HIV infection, with no history of HIV-related illness (HCC) WL is  undetectable On Descovy, Tivicay  4. Rh negative state in antepartum period, second trimester S/p Rhogam and pp if indicated.  5. Bipolar 1 disorder (HCC) ON no meds, but connected for treatment to begin pp.  Preterm labor symptoms and general obstetric precautions including but not limited to vaginal bleeding, contractions, leaking of fluid and fetal movement were reviewed in detail with the patient. Please refer to After Visit Summary for other counseling recommendations.   Return in 2 weeks (on 06/21/2022).  Future Appointments  Date Time Provider Department Center  06/11/2022  9:00 AM Irene Shipper, LCSW [facility]  06/19/2022  9:30 AM [facility] NURSE [facility]  06/19/2022  9:45 AM [facility] US4 [facility]  07/15/2022  8:45 AM Blanchard Kelch, NP [facility]  07/17/2022 10:30 AM [facility] NURSE [facility]  07/17/2022 10:45 AM [facility] US5 [facility]  07/24/2022 10:30 AM [facility] NURSE [facility]  07/24/2022 10:45 AM [facility] US5 [facility]  07/31/2022 10:30 AM [facility] NURSE [facility]  07/31/2022 10:45 AM [facility] US5 [facility]    Reva Bores, MD

## 2022-06-10 ENCOUNTER — Encounter: Payer: Self-pay | Admitting: General Practice

## 2022-06-11 ENCOUNTER — Ambulatory Visit: Payer: Self-pay

## 2022-06-14 ENCOUNTER — Inpatient Hospital Stay (HOSPITAL_COMMUNITY)
Admission: AD | Admit: 2022-06-14 | Discharge: 2022-06-14 | Disposition: A | Discharge status: Home / Self Care | Payer: Medicaid Other | Attending: Obstetrics and Gynecology | Admitting: Obstetrics and Gynecology

## 2022-06-14 ENCOUNTER — Encounter (HOSPITAL_COMMUNITY): Payer: Self-pay | Admitting: Obstetrics & Gynecology

## 2022-06-14 ENCOUNTER — Other Ambulatory Visit: Payer: Self-pay

## 2022-06-14 DIAGNOSIS — O99333 Smoking (tobacco) complicating pregnancy, third trimester: Secondary | ICD-10-CM | POA: Insufficient documentation

## 2022-06-14 DIAGNOSIS — O23593 Infection of other part of genital tract in pregnancy, third trimester: Secondary | ICD-10-CM | POA: Insufficient documentation

## 2022-06-14 DIAGNOSIS — O99891 Other specified diseases and conditions complicating pregnancy: Secondary | ICD-10-CM | POA: Diagnosis present

## 2022-06-14 DIAGNOSIS — Z3A31 31 weeks gestation of pregnancy: Secondary | ICD-10-CM | POA: Insufficient documentation

## 2022-06-14 DIAGNOSIS — B3731 Acute candidiasis of vulva and vagina: Secondary | ICD-10-CM | POA: Diagnosis not present

## 2022-06-14 DIAGNOSIS — Z6791 Unspecified blood type, Rh negative: Secondary | ICD-10-CM

## 2022-06-14 DIAGNOSIS — O3433 Maternal care for cervical incompetence, third trimester: Secondary | ICD-10-CM | POA: Diagnosis not present

## 2022-06-14 DIAGNOSIS — O0990 Supervision of high risk pregnancy, unspecified, unspecified trimester: Secondary | ICD-10-CM

## 2022-06-14 DIAGNOSIS — O09292 Supervision of pregnancy with other poor reproductive or obstetric history, second trimester: Secondary | ICD-10-CM

## 2022-06-14 DIAGNOSIS — M545 Low back pain, unspecified: Secondary | ICD-10-CM | POA: Diagnosis not present

## 2022-06-14 DIAGNOSIS — O98813 Other maternal infectious and parasitic diseases complicating pregnancy, third trimester: Secondary | ICD-10-CM | POA: Diagnosis not present

## 2022-06-14 DIAGNOSIS — O98713 Human immunodeficiency virus [HIV] disease complicating pregnancy, third trimester: Secondary | ICD-10-CM | POA: Insufficient documentation

## 2022-06-14 DIAGNOSIS — F17290 Nicotine dependence, other tobacco product, uncomplicated: Secondary | ICD-10-CM | POA: Diagnosis not present

## 2022-06-14 DIAGNOSIS — O26893 Other specified pregnancy related conditions, third trimester: Secondary | ICD-10-CM | POA: Diagnosis present

## 2022-06-14 DIAGNOSIS — O4693 Antepartum hemorrhage, unspecified, third trimester: Secondary | ICD-10-CM | POA: Insufficient documentation

## 2022-06-14 LAB — URINALYSIS, ROUTINE W REFLEX MICROSCOPIC
Bilirubin Urine: NEGATIVE
Glucose, UA: NEGATIVE mg/dL
Hgb urine dipstick: NEGATIVE
Ketones, ur: NEGATIVE mg/dL
Leukocytes,Ua: NEGATIVE
Nitrite: NEGATIVE
Protein, ur: NEGATIVE mg/dL
Specific Gravity, Urine: 1.01 (ref 1.005–1.030)
pH: 7 (ref 5.0–8.0)

## 2022-06-14 LAB — WET PREP, GENITAL
Clue Cells Wet Prep HPF POC: NONE SEEN
Sperm: NONE SEEN
Trich, Wet Prep: NONE SEEN
WBC, Wet Prep HPF POC: >=10 — AB (ref <10)
Yeast Wet Prep HPF POC: NONE SEEN

## 2022-06-14 MED ORDER — TERCONAZOLE 0.4 % VA CREA: 1.0000 | TOPICAL_CREAM | Freq: Every day | VAGINAL | 0 refills | Status: DC

## 2022-06-14 MED ORDER — RHO D IMMUNE GLOBULIN 1500 UNIT/2ML IJ SOSY
300.0000 ug | PREFILLED_SYRINGE | Freq: Once | INTRAMUSCULAR | Status: AC
  Administered 2022-06-14: 300 ug via INTRAMUSCULAR
  Filled 2022-06-14: qty 2

## 2022-06-14 MED ORDER — ACETAMINOPHEN 500 MG PO TABS
1000.0000 mg | ORAL_TABLET | Freq: Once | ORAL | Status: AC
  Administered 2022-06-14: 1000 mg via ORAL
  Filled 2022-06-14: qty 2

## 2022-06-14 NOTE — MAU Note (Signed)
Nicole Morgan is a 26 y.o. at [redacted]w[redacted]d here in MAU reporting: intermittent abdominal pain/tightening and lower back pain with pelvic pressure.  Also reports spotting with 2 dime sized clots wiping this morning.  Denies LOF & VB.  Endorses +FM. LMP: N/A Onset of complaint: last night. Pain score: 0 Vitals:   06/14/22 1127  BP: 112/76  Pulse: (!) 109  Resp: 20  Temp: 98 F (36.7 C)  SpO2: 99%     FHT:150 bpm Lab orders placed from triage:   UA

## 2022-06-14 NOTE — MAU Provider Note (Signed)
History     CSN: [id number]  Arrival date and time: 06/14/22 1100   None     Chief Complaint  Patient presents with   Back Pain   Vaginal Bleeding   HPI Nicole Morgan is a 26 y.o. G2P1001 at [redacted]w[redacted]d who presents to MAU for back pain and vaginal bleeding. Pregnancy has been complicated by bilobed placenta, HIV+, and Rh negative. She reports that last night she started having low back pain. She tried using a heating pad but it did not help, however pain did ease up when she laid on her side. This morning she reports she woke up and noticed a small amount of bleeding with wiping and 2 tiny clots in the toilet. She has not had any more bleeding since. She reports earlier her abdomen was "hard as a rock" and she had some pressure in her lower abdomen, however that has since resolved, but she continues to report some low back pain. She denies leaking fluid, itching/odor, or urinary s/s. She endorses normal active fetal movement.   Patient receives [facility]. Next appointment is scheduled on 10/2.   OB History     Gravida  2   Para  1   Term  1   Preterm  0   AB  0   Living  1      SAB  0   IAB  0   Ectopic  0   Multiple  0   Live Births  1           Past Medical History:  Diagnosis Date   Cystic fibrosis carrier    Depression    no meds   Eczema    Incompetent cervix    UTI (urinary tract infection)     Past Surgical History:  Procedure Laterality Date   CERVICAL CERCLAGE  06/06/2012   Procedure: CERCLAGE CERVICAL;  Surgeon: Melina Schools, MD;  Location: [facility];  Service: Gynecology;  Laterality: N/A;   PLACEMENT OF BREAST IMPLANTS     WISDOM TOOTH EXTRACTION      Family History  Problem Relation Age of Onset   Bipolar disorder Mother        schizophrenic   Mental illness Mother        schizophrenia   Deep vein thrombosis Maternal Aunt    Alcohol abuse Maternal Grandmother    Mental illness Maternal Grandmother    Bipolar disorder Maternal  Grandmother        schizophrenic    Cancer Maternal Grandmother        lymph nodes   Migraines Neg Hx    Headache Neg Hx     Social History   Tobacco Use   Smoking status: Every Day    Types: E-cigarettes   Smokeless tobacco: Never  Vaping Use   Vaping Use: Former   Substances: Nicotine, Flavoring  Substance Use Topics   Alcohol use: Not Currently    Comment: 1-2 beers every now and then   Drug use: Yes    Types: Marijuana    Comment: last smoked 03/30/2022    Allergies: No Known Allergies  Medications Prior to Admission  Medication Sig Dispense Refill Last Dose   dolutegravir (TIVICAY) 50 MG tablet Take 1 tablet (50 mg total) by mouth daily. 30 tablet 11 06/14/2022   emtricitabine-tenofovir AF (DESCOVY) 200-25 MG tablet Take 1 tablet by mouth daily. 30 tablet 11 06/14/2022   Prenatal Vit-Fe Fumarate-FA (MULTIVITAMIN-PRENATAL) 27-0.8 MG TABS tablet Take  1 tablet by mouth daily at 12 noon.   06/14/2022   cetirizine (ZYRTEC) 10 MG tablet Take 10 mg by mouth daily. (Patient not taking: Reported on 05/22/2022)      ferrous sulfate 325 (65 FE) MG tablet Take 325 mg by mouth 2 (two) times daily. (Patient not taking: Reported on 05/22/2022)      Review of Systems  Constitutional: Negative.   Respiratory: Negative.    Cardiovascular: Negative.   Gastrointestinal: Negative.   Genitourinary:  Positive for vaginal bleeding. Negative for dysuria.  Musculoskeletal:  Positive for back pain.  Neurological: Negative.    Physical Exam   Blood pressure 112/76, pulse (!) 109, temperature 98 F (36.7 C), temperature source Oral, resp. rate 20, height 5\' 1"  (1.549 m), weight 65 kg, last menstrual period 11/06/2021, SpO2 99 %, unknown if currently breastfeeding.  Physical Exam Constitutional:      General: She is not in acute distress. Eyes:     Extraocular Movements: Extraocular movements intact.     Pupils: Pupils are equal, round, and reactive to light.  Cardiovascular:     Rate and  Rhythm: Tachycardia present.  Pulmonary:     Effort: Pulmonary effort is normal.  Abdominal:     Palpations: Abdomen is soft.     Tenderness: There is no abdominal tenderness.     Comments: Gravid   Genitourinary:    Comments: Normal external female genitalia, vaginal walls pink with rugae, small amount of thick white discharge, some white patches noted along vaginal walls, no bleeding, no pooling of amniotic fluid, cervix visually closed without lesions/masses Musculoskeletal:        General: Normal range of motion.  Skin:    General: Skin is warm and dry.  Neurological:     General: No focal deficit present.     Mental Status: She is alert and oriented to person, place, and time.  Psychiatric:        Mood and Affect: Mood normal.        Behavior: Behavior normal.  Dilation: Closed Effacement (%): Thick Exam by:: D. Darnice Comrie CNM  NST FHR: 140 bpm, moderate variability, +15x15 accels, no decels Toco: quiet  MAU Course  Procedures  MDM UA  Speculum exam, wet prep, GC/CT Tylenol PO NST Rhogam  UA negative. Speculum exam without evidence of any vaginal bleeding. There is a small amount of thick, patchy white discharge along vaginal walls that is suspicious for yeast although wet prep is negative. GC/CT pending. Cervix is closed/thick. Patient was given 1g Tylenol for back pain Patient is Rh neg and given that she presented with vaginal bleeding Rhogam workup ordered. She was last given Rhogam at 21 weeks. Given patient report of vaginal bleeding and last dose received at 21 weeks, will order Rhophylac today.  NST reassuring for gestational age, toco quiet.  On reassessment, back pain has improved after taking Tylenol.   Assessment and Plan  [redacted] weeks gestation of pregnancy Vaginal spotting Yeast vaginitis  - Discharge home in stable condition - Rx for terazol sent to pharmacy - Keep OB appointment as scheduled - Strict return precautions. Return to MAU as needed for  new/worsening symptoms   Renee Harder, CNM 06/14/2022, 5:29 PM

## 2022-06-15 LAB — RH IG WORKUP (INCLUDES ABO/RH)
ABO/RH(D): O NEG
Antibody Screen: POSITIVE
Gestational Age(Wks): 31.3
Unit division: 0

## 2022-06-15 LAB — GC/CHLAMYDIA PROBE AMP (~~LOC~~) NOT AT ARMC
Chlamydia: NEGATIVE
Comment: NEGATIVE
Comment: NORMAL
Neisseria Gonorrhea: NEGATIVE

## 2022-06-19 ENCOUNTER — Ambulatory Visit: Payer: Medicaid Other | Attending: Maternal & Fetal Medicine

## 2022-06-19 ENCOUNTER — Ambulatory Visit: Payer: Medicaid Other | Admitting: *Deleted

## 2022-06-19 VITALS — BP 124/65 | HR 112

## 2022-06-19 DIAGNOSIS — O09292 Supervision of pregnancy with other poor reproductive or obstetric history, second trimester: Secondary | ICD-10-CM | POA: Insufficient documentation

## 2022-06-19 DIAGNOSIS — Z21 Asymptomatic human immunodeficiency virus [HIV] infection status: Secondary | ICD-10-CM

## 2022-06-19 DIAGNOSIS — O99323 Drug use complicating pregnancy, third trimester: Secondary | ICD-10-CM

## 2022-06-19 DIAGNOSIS — F1910 Other psychoactive substance abuse, uncomplicated: Secondary | ICD-10-CM | POA: Diagnosis not present

## 2022-06-19 DIAGNOSIS — O0990 Supervision of high risk pregnancy, unspecified, unspecified trimester: Secondary | ICD-10-CM | POA: Diagnosis present

## 2022-06-19 DIAGNOSIS — O09893 Supervision of other high risk pregnancies, third trimester: Secondary | ICD-10-CM | POA: Diagnosis present

## 2022-06-19 DIAGNOSIS — O98713 Human immunodeficiency virus [HIV] disease complicating pregnancy, third trimester: Secondary | ICD-10-CM | POA: Diagnosis not present

## 2022-06-19 DIAGNOSIS — O0933 Supervision of pregnancy with insufficient antenatal care, third trimester: Secondary | ICD-10-CM | POA: Insufficient documentation

## 2022-06-19 DIAGNOSIS — Z3689 Encounter for other specified antenatal screening: Secondary | ICD-10-CM | POA: Insufficient documentation

## 2022-06-19 DIAGNOSIS — Z141 Cystic fibrosis carrier: Secondary | ICD-10-CM

## 2022-06-19 DIAGNOSIS — Z3A32 32 weeks gestation of pregnancy: Secondary | ICD-10-CM

## 2022-06-19 DIAGNOSIS — O285 Abnormal chromosomal and genetic finding on antenatal screening of mother: Secondary | ICD-10-CM

## 2022-06-22 ENCOUNTER — Other Ambulatory Visit: Payer: Self-pay

## 2022-06-22 ENCOUNTER — Encounter (HOSPITAL_COMMUNITY): Payer: Self-pay | Admitting: Obstetrics and Gynecology

## 2022-06-22 ENCOUNTER — Inpatient Hospital Stay (HOSPITAL_COMMUNITY)
Admission: AD | Admit: 2022-06-22 | Discharge: 2022-06-22 | Disposition: A | Discharge status: Home / Self Care | Payer: Medicaid Other | Attending: Obstetrics and Gynecology | Admitting: Obstetrics and Gynecology

## 2022-06-22 DIAGNOSIS — M549 Dorsalgia, unspecified: Secondary | ICD-10-CM | POA: Diagnosis not present

## 2022-06-22 DIAGNOSIS — Z3A32 32 weeks gestation of pregnancy: Secondary | ICD-10-CM | POA: Insufficient documentation

## 2022-06-22 DIAGNOSIS — Z0371 Encounter for suspected problem with amniotic cavity and membrane ruled out: Secondary | ICD-10-CM | POA: Diagnosis present

## 2022-06-22 DIAGNOSIS — Z3689 Encounter for other specified antenatal screening: Secondary | ICD-10-CM | POA: Insufficient documentation

## 2022-06-22 DIAGNOSIS — O26893 Other specified pregnancy related conditions, third trimester: Secondary | ICD-10-CM | POA: Insufficient documentation

## 2022-06-22 LAB — URINALYSIS, ROUTINE W REFLEX MICROSCOPIC
Bilirubin Urine: NEGATIVE
Glucose, UA: NEGATIVE mg/dL
Hgb urine dipstick: NEGATIVE
Ketones, ur: NEGATIVE mg/dL
Leukocytes,Ua: NEGATIVE
Nitrite: NEGATIVE
Protein, ur: NEGATIVE mg/dL
Specific Gravity, Urine: 1.009 (ref 1.005–1.030)
pH: 8 (ref 5.0–8.0)

## 2022-06-22 LAB — POCT FERN TEST: POCT Fern Test: NEGATIVE

## 2022-06-22 NOTE — MAU Note (Signed)
.  Nicole Morgan is a 26 y.o. at [redacted]w[redacted]d here in MAU reporting: possible leaking of clear fluid and lower back pain. LMP: 11/06/2021 Onset of complaint: 1430 Pain score: 4 Vitals:   06/22/22 1524  BP: 115/75  Pulse: 100  Resp: 16  Temp: 98.1 F (36.7 C)  SpO2: 100%     FHT:145 Lab orders placed from triage:

## 2022-06-22 NOTE — MAU Provider Note (Signed)
History   [id number]   Chief Complaint  Patient presents with   Rupture of Membranes    Leaking fluid    Back Pain    HPI Nicole Morgan is a 26 y.o. female  G2P1001 @32 .4 wks here reporting clear fluid on her underwear. This happened a few hours ago. Leaking of fluid has not continued. Pt reports no contractions. She denies vaginal bleeding. Last intercourse was not recent. She reports good fetal movement. All other systems negative.    Patient's last menstrual period was 11/06/2021.  OB History  Gravida Para Term Preterm AB Living  2 1 1  0 0 1  SAB IAB Ectopic Multiple Live Births  0 0 0 0 1    # Outcome Date GA Lbr Len/2nd Weight Sex Delivery Anes PTL Lv  2 Current           1 Term 10/16/12 [redacted]w[redacted]d / 00:33 3520 g M Vag-Spont EPI  LIV     Birth Comments: cerclage for incompetent cervix    Past Medical History:  Diagnosis Date   Anxiety 2020   Bipolar 1 disorder (HCC) 2020   Cystic fibrosis carrier    Depression    no meds   Eczema    HIV (human immunodeficiency virus infection) (HCC) 04/2022   Incompetent cervix    Insomnia 2020   PTSD (post-traumatic stress disorder) 2020   UTI (urinary tract infection)     Family History  Problem Relation Age of Onset   Bipolar disorder Mother        schizophrenic   Mental illness Mother        schizophrenia   Deep vein thrombosis Maternal Aunt    Alcohol abuse Maternal Grandmother    Mental illness Maternal Grandmother    Bipolar disorder Maternal Grandmother        schizophrenic    Cancer Maternal Grandmother        lymph nodes   Migraines Neg Hx    Headache Neg Hx     Social History   Socioeconomic History   Marital status: Single    Spouse name: Not on file   Number of children: 1   Years of education: Not on file   Highest education level: Some college, no degree  Occupational History   Not on file  Tobacco Use   Smoking status: Former    Types: E-cigarettes    Quit date: 04/2022    Years since  quitting: 0.1   Smokeless tobacco: Never  Vaping Use   Vaping Use: Former  Substance and Sexual Activity   Alcohol use: Not Currently    Comment: 1-2 beers every now and then   Drug use: Not Currently    Types: Marijuana    Comment: last smoked 03/30/2022   Sexual activity: Not Currently  Other Topics Concern   Not on file  Social History Narrative   Lives with child    Right handed   Caffeine: about 3 cups/day maybe more   Social Determinants of Health   Financial Resource Strain: Medium Risk (05/16/2022)   Overall Financial Resource Strain (CARDIA)    Difficulty of Paying Living Expenses: Somewhat hard  Food Insecurity: Food Insecurity Present (05/16/2022)   Hunger Vital Sign    Worried About Running Out of Food in the Last Year: Sometimes true    Ran Out of Food in the Last Year: Sometimes true  Transportation Needs: No Transportation Needs (05/16/2022)   PRAPARE - Transportation  Lack of Transportation (Medical): No    Lack of Transportation (Non-Medical): No  Physical Activity: Sufficiently Active (05/16/2022)   Exercise Vital Sign    Days of Exercise per Week: 7 days    Minutes of Exercise per Session: 30 min  Stress: Stress Concern Present (05/16/2022)   [place]    Feeling of Stress : Very much  Social Connections: Moderately Integrated (05/16/2022)   Social Connection and Isolation Panel [NHANES]    Frequency of Communication with Friends and Family: More than three times a week    Frequency of Social Gatherings with Friends and Family: Three times a week    Attends Religious Services: More than 4 times per year    Active Member of Clubs or Organizations: Yes    Attends Archivist Meetings: More than 4 times per year    Marital Status: Never married    No Known Allergies  No current facility-administered medications on file prior to encounter.   Current Outpatient Medications on File  Prior to Encounter  Medication Sig Dispense Refill   dolutegravir (TIVICAY) 50 MG tablet Take 1 tablet (50 mg total) by mouth daily. 30 tablet 11   emtricitabine-tenofovir AF (DESCOVY) 200-25 MG tablet Take 1 tablet by mouth daily. 30 tablet 11   Prenatal Vit-Fe Fumarate-FA (MULTIVITAMIN-PRENATAL) 27-0.8 MG TABS tablet Take 1 tablet by mouth daily at 12 noon.     cetirizine (ZYRTEC) 10 MG tablet Take 10 mg by mouth daily. (Patient not taking: Reported on 05/22/2022)     ferrous sulfate 325 (65 FE) MG tablet Take 325 mg by mouth 2 (two) times daily. (Patient not taking: Reported on 05/22/2022)     terconazole (TERAZOL 7) 0.4 % vaginal cream Place 1 applicator vaginally at bedtime. Use for seven days 45 g 0     Review of Systems  Gastrointestinal:  Negative for abdominal pain.  Genitourinary:  Positive for vaginal discharge. Negative for dysuria and vaginal bleeding.     Physical Exam   Vitals:   06/22/22 1524  BP: 115/75  Pulse: 100  Resp: 16  Temp: 98.1 F (36.7 C)  TempSrc: Oral  SpO2: 100%  Weight: 65.5 kg  Height: 5\' 1"  (1.549 m)    Physical Exam Vitals and nursing note reviewed. Exam conducted with a chaperone present.  Constitutional:      General: She is not in acute distress.    Appearance: Normal appearance.  HENT:     Head: Normocephalic and atraumatic.  Cardiovascular:     Rate and Rhythm: Normal rate.  Pulmonary:     Effort: Pulmonary effort is normal. No respiratory distress.  Abdominal:     Palpations: Abdomen is soft.     Tenderness: There is no abdominal tenderness.  Genitourinary:    Comments: SSE: no pool Musculoskeletal:        General: Normal range of motion.     Cervical back: Normal range of motion.  Skin:    General: Skin is warm and dry.  Neurological:     General: No focal deficit present.     Mental Status: She is alert and oriented to person, place, and time.  Psychiatric:        Mood and Affect: Mood normal.        Behavior: Behavior  normal.   EFM: 135 bpm, mod variability, + accels, no decels Toco: UI  Results for orders placed or performed during the hospital encounter of 06/22/22 (from  the past 24 hour(s))  Urinalysis, Routine w reflex microscopic Urine, Clean Catch     Status: None   Collection Time: 06/22/22  4:28 PM  Result Value Ref Range   Color, Urine YELLOW YELLOW   APPearance CLEAR CLEAR   Specific Gravity, Urine 1.009 1.005 - 1.030   pH 8.0 5.0 - 8.0   Glucose, UA NEGATIVE NEGATIVE mg/dL   Hgb urine dipstick NEGATIVE NEGATIVE   Bilirubin Urine NEGATIVE NEGATIVE   Ketones, ur NEGATIVE NEGATIVE mg/dL   Protein, ur NEGATIVE NEGATIVE mg/dL   Nitrite NEGATIVE NEGATIVE   Leukocytes,Ua NEGATIVE NEGATIVE  Fern Test     Status: None   Collection Time: 06/22/22  4:47 PM  Result Value Ref Range   POCT Fern Test Negative = intact amniotic membranes    MAU Course  Procedures  MDM Labs ordered and reviewed. No signs of PROM. Stable for discharge home.   Assessment and Plan   1. [redacted] weeks gestation of pregnancy   2. NST (non-stress test) reactive   3. Encounter for suspected premature rupture of amniotic membranes, with rupture of membranes not found    Discharge home [facility] up at [facility] as scheduled PTL precautions  Allergies as of 06/22/2022   No Known Allergies      Medication List     STOP taking these medications    cetirizine 10 MG tablet Commonly known as: ZYRTEC   ferrous sulfate 325 (65 FE) MG tablet   terconazole 0.4 % vaginal cream Commonly known as: TERAZOL 7       TAKE these medications    emtricitabine-tenofovir AF 200-25 MG tablet Commonly known as: DESCOVY Take 1 tablet by mouth daily.   multivitamin-prenatal 27-0.8 MG Tabs tablet Take 1 tablet by mouth daily at 12 noon.   Tivicay 50 MG tablet Generic drug: dolutegravir Take 1 tablet (50 mg total) by mouth daily.       Donette Larry, CNM 06/22/2022 4:25 PM

## 2022-06-24 ENCOUNTER — Encounter: Payer: Self-pay | Admitting: Obstetrics and Gynecology

## 2022-06-25 ENCOUNTER — Encounter: Payer: Self-pay | Admitting: Advanced Practice Midwife

## 2022-06-26 ENCOUNTER — Other Ambulatory Visit: Payer: Self-pay

## 2022-06-26 ENCOUNTER — Ambulatory Visit: Payer: MEDICAID

## 2022-06-26 ENCOUNTER — Ambulatory Visit (INDEPENDENT_AMBULATORY_CARE_PROVIDER_SITE_OTHER): Payer: Self-pay | Admitting: Family Medicine

## 2022-06-26 VITALS — BP 108/74 | HR 111 | Wt 143.7 lb

## 2022-06-26 DIAGNOSIS — O26893 Other specified pregnancy related conditions, third trimester: Secondary | ICD-10-CM

## 2022-06-26 DIAGNOSIS — O0993 Supervision of high risk pregnancy, unspecified, third trimester: Secondary | ICD-10-CM

## 2022-06-26 DIAGNOSIS — F1910 Other psychoactive substance abuse, uncomplicated: Secondary | ICD-10-CM

## 2022-06-26 DIAGNOSIS — Z6791 Unspecified blood type, Rh negative: Secondary | ICD-10-CM

## 2022-06-26 DIAGNOSIS — O09292 Supervision of pregnancy with other poor reproductive or obstetric history, second trimester: Secondary | ICD-10-CM

## 2022-06-26 DIAGNOSIS — O09293 Supervision of pregnancy with other poor reproductive or obstetric history, third trimester: Secondary | ICD-10-CM

## 2022-06-26 DIAGNOSIS — O0990 Supervision of high risk pregnancy, unspecified, unspecified trimester: Secondary | ICD-10-CM

## 2022-06-26 DIAGNOSIS — F319 Bipolar disorder, unspecified: Secondary | ICD-10-CM

## 2022-06-26 DIAGNOSIS — Z3009 Encounter for other general counseling and advice on contraception: Secondary | ICD-10-CM | POA: Insufficient documentation

## 2022-06-26 DIAGNOSIS — Z21 Asymptomatic human immunodeficiency virus [HIV] infection status: Secondary | ICD-10-CM

## 2022-06-26 DIAGNOSIS — Z3A33 33 weeks gestation of pregnancy: Secondary | ICD-10-CM

## 2022-06-26 NOTE — Patient Instructions (Signed)

## 2022-06-26 NOTE — Progress Notes (Signed)
   Subjective:  Nicole Morgan is a 26 y.o. G2P1001 at 110w1d being seen today for ongoing prenatal care.  She is currently monitored for the following issues for this high-risk pregnancy and has Polysubstance abuse ([place]); Bipolar 1 disorder ([place]); Cystic fibrosis carrier; Supervision of high risk pregnancy, antepartum; HIV (human immunodeficiency virus infection) ([place]); History of cervical incompetence in pregnancy, currently pregnant, second trimester; Rh negative state in antepartum period, second trimester; and Unwanted fertility on their problem list.  Patient reports no complaints.  Contractions: Irritability. Vag. Bleeding: None.  Movement: Present. Denies leaking of fluid.   The following portions of the patient's history were reviewed and updated as appropriate: allergies, current medications, past family history, past medical history, past social history, past surgical history and problem list. Problem list updated.  Objective:   Vitals:   06/26/22 0938  BP: 108/74  Pulse: (!) 111  Weight: 143 lb 11.2 oz (65.2 kg)    Fetal Status: Fetal Heart Rate (bpm): 142   Movement: Present     General:  Alert, oriented and cooperative. Patient is in no acute distress.  Skin: Skin is warm and dry. No rash noted.   Cardiovascular: Normal heart rate noted  Respiratory: Normal respiratory effort, no problems with respiration noted  Abdomen: Soft, gravid, appropriate for gestational age. Pain/Pressure: Present     Pelvic: Vag. Bleeding: None     Cervical exam deferred        Extremities: Normal range of motion.  Edema: None  Mental Status: Normal mood and affect. Normal behavior. Normal judgment and thought content.   Urinalysis:      Assessment and Plan:  Pregnancy: G2P1001 at [redacted]w[redacted]d  1. Supervision of high risk pregnancy, antepartum BP and FHR normal  2. Asymptomatic HIV infection, with no history of HIV-related illness ([place]) Following w ID Last VL done on 05/14/2022 and was low at  47 On Descovy, Tivicay, reports she is taking meds Following w MFM Scheduled for 39 wk IOL per her request  3. Rh negative state in antepartum period, second trimester S/p rhogam twice, most recently on 06/14/2022  4. History of cervical incompetence in pregnancy, currently pregnant, second trimester Normal cervical length at 22 weeks Following w MFM  5. Bipolar 1 disorder [facility]) Seeing counselor at [facility] and also seeing one at [facility] building services  6. Polysubstance abuse ([place]) Reports no issues during pregnancy  7. Unwanted fertility [place] Medicaid not yet active, very much desires BTL, declines reversible forms of contraception Going to [facility] office today, will come to clinic immediately once she has card to sign tubal papers, understands there needs to be at least 30 days between signing and delivery to have the procedure done  Preterm labor symptoms and general obstetric precautions including but not limited to vaginal bleeding, contractions, leaking of fluid and fetal movement were reviewed in detail with the patient. Please refer to After Visit Summary for other counseling recommendations.  Return in 2 weeks (on 07/10/2022) for [facility], ob visit, needs MD.   Clarnce Flock, MD

## 2022-07-02 ENCOUNTER — Ambulatory Visit: Payer: Medicaid Other

## 2022-07-02 ENCOUNTER — Other Ambulatory Visit: Payer: Self-pay

## 2022-07-03 ENCOUNTER — Ambulatory Visit: Payer: MEDICAID

## 2022-07-10 ENCOUNTER — Ambulatory Visit: Payer: MEDICAID

## 2022-07-15 ENCOUNTER — Other Ambulatory Visit: Payer: Self-pay

## 2022-07-15 ENCOUNTER — Ambulatory Visit (INDEPENDENT_AMBULATORY_CARE_PROVIDER_SITE_OTHER): Payer: Medicaid Other | Admitting: Infectious Diseases

## 2022-07-15 ENCOUNTER — Telehealth: Payer: Self-pay | Admitting: Family Medicine

## 2022-07-15 ENCOUNTER — Encounter: Payer: Self-pay | Admitting: Infectious Diseases

## 2022-07-15 VITALS — BP 114/74 | HR 105 | Temp 98.2°F | Ht 61.0 in | Wt 147.0 lb

## 2022-07-15 DIAGNOSIS — O0990 Supervision of high risk pregnancy, unspecified, unspecified trimester: Secondary | ICD-10-CM | POA: Diagnosis not present

## 2022-07-15 DIAGNOSIS — F319 Bipolar disorder, unspecified: Secondary | ICD-10-CM

## 2022-07-15 DIAGNOSIS — Z21 Asymptomatic human immunodeficiency virus [HIV] infection status: Secondary | ICD-10-CM

## 2022-07-15 NOTE — Patient Instructions (Addendum)
So excited for you and baby girl!  Would like to have you get the RSV vaccine - please see if your OB team can help get this for you. They do have it available at [facility] and [facility] from what I understand.   Please continue the tivicay and descovy everyday taken together. Figure out a good plan to make sure you and baby girl get your medication everyday. Make sure you find a "sweet spot" to dedicate 5 minutes to get your health taken care of everyday.   Would like to see you back in 3-4 months.

## 2022-07-15 NOTE — Assessment & Plan Note (Signed)
She has ongoing counseling appointments set up - watch for changes after delivery for post partum period.

## 2022-07-15 NOTE — Progress Notes (Signed)
Name: Nicole Morgan  DOB: 04/03/1996 MRN: [id number] PCP: Ferd Hibbs, NP     Brief Narrative:  Nicole Morgan is a 26 y.o. female with presumptively positive HIV diagnosis 04/29/22 from [facility] as part of contact tracing event HIV Risk: sexual History of OIs:  Intake Labs: Hep B sAg (-), sAb (-), cAb (); Hep A (), Hep C (-) Quantiferon (-)   Previous Regimens: Tivicay + Descovy 04-2022  Genotypes: Not collected.   Subjective:   Chief Complaint  Patient presents with   Follow-up    B20      HPI: Nicole Morgan is here for follow up today. She is [redacted]w[redacted]d gestation with her second child. EDD 08-06-22. Has been doing well on tivicay/descovy taken together everyday. No further vomiting/nausea since switching from truvada and advancing in pregnancy.  She is planning an IOL at 39 weeks per her request. Would like BTL for future contraception considerations. Not interested in breastfeeding and planning to bottle feed.  Has had TDap and Flu vaccine current pregnancy. Interested in RSV vaccine.   Her father is moving up from [place] to help with housing and support for her and baby girl.  Continues to work, planning 2-4 weeks off (financially needs to continue). Continues to work with counselors for mental health support.       06/26/2022    5:16 PM  Depression screen PHQ 2/9  Decreased Interest 0  Down, Depressed, Hopeless 1  PHQ - 2 Score 1  Altered sleeping 2  Tired, decreased energy 2  Change in appetite 1  Feeling bad or failure about yourself  0  Trouble concentrating 0  Moving slowly or fidgety/restless 0  Suicidal thoughts 0  PHQ-9 Score 6     Review of Systems  Constitutional:  Negative for chills, fever, malaise/fatigue and weight loss.  HENT:  Negative for sore throat.   Respiratory:  Negative for cough, sputum production and shortness of breath.   Cardiovascular: Negative.   Gastrointestinal:  Negative for abdominal pain, diarrhea and vomiting.   Musculoskeletal:  Negative for joint pain, myalgias and neck pain.  Skin:  Negative for rash.  Neurological:  Negative for headaches.  Psychiatric/Behavioral:  Negative for depression and substance abuse. The patient is not nervous/anxious.      Past Medical History:  Diagnosis Date   Anxiety 2020   Bipolar 1 disorder ([place]) 2020   Cystic fibrosis carrier    Depression    no meds   Eczema    HIV (human immunodeficiency virus infection) ([place]) 04/2022   Incompetent cervix    Insomnia 2020   PTSD (post-traumatic stress disorder) 2020   UTI (urinary tract infection)     Outpatient Medications Prior to Visit  Medication Sig Dispense Refill   dolutegravir (TIVICAY) 50 MG tablet Take 1 tablet (50 mg total) by mouth daily. 30 tablet 11   emtricitabine-tenofovir AF (DESCOVY) 200-25 MG tablet Take 1 tablet by mouth daily. 30 tablet 11   Prenatal Vit-Fe Fumarate-FA (MULTIVITAMIN-PRENATAL) 27-0.8 MG TABS tablet Take 1 tablet by mouth daily at 12 noon.     No facility-administered medications prior to visit.     No Known Allergies  Social History   Tobacco Use   Smoking status: Former    Types: E-cigarettes    Quit date: 04/2022    Years since quitting: 0.2   Smokeless tobacco: Never  Vaping Use   Vaping Use: Former  Substance Use Topics   Alcohol use: Not Currently  Comment: 1-2 beers every now and then   Drug use: Not Currently    Types: Marijuana    Comment: last smoked 03/30/2022    Family History  Problem Relation Age of Onset   Bipolar disorder Mother        schizophrenic   Mental illness Mother        schizophrenia   Deep vein thrombosis Maternal Aunt    Alcohol abuse Maternal Grandmother    Mental illness Maternal Grandmother    Bipolar disorder Maternal Grandmother        schizophrenic    Cancer Maternal Grandmother        lymph nodes   Migraines Neg Hx    Headache Neg Hx     Social History   Substance and Sexual Activity  Sexual Activity Not  Currently     Objective:   Vitals:   07/15/22 0851  BP: 114/74  Pulse: (!) 105  Temp: 98.2 F (36.8 C)  TempSrc: Temporal  Weight: 147 lb (66.7 kg)  Height: 5\' 1"  (1.549 m)   Body mass index is 27.78 kg/m.  Physical Exam Vitals reviewed.  Constitutional:      Appearance: She is well-developed.     Comments: Seated comfortably in chair.   HENT:     Mouth/Throat:     Mouth: No oral lesions.     Dentition: Normal dentition. No dental abscesses.     Pharynx: No oropharyngeal exudate.  Cardiovascular:     Rate and Rhythm: Normal rate and regular rhythm.     Heart sounds: Normal heart sounds.  Pulmonary:     Effort: Pulmonary effort is normal.     Breath sounds: Normal breath sounds.  Abdominal:     General: There is no distension.     Palpations: Abdomen is soft.     Tenderness: There is no abdominal tenderness.     Comments: Pregnancy   Lymphadenopathy:     Cervical: No cervical adenopathy.  Skin:    General: Skin is warm and dry.     Findings: No rash.  Neurological:     Mental Status: She is alert and oriented to person, place, and time.  Psychiatric:        Judgment: Judgment normal.     Lab Results Lab Results  Component Value Date   WBC 10.0 05/17/2022   HGB 10.8 (L) 05/17/2022   HCT 32.0 (L) 05/17/2022   MCV 91 05/17/2022   PLT 359 05/17/2022    Lab Results  Component Value Date   CREATININE 0.54 04/01/2022   BUN <5 (L) 04/01/2022   NA 136 04/01/2022   K 3.7 04/01/2022   CL 103 04/01/2022   CO2 22 04/01/2022    Lab Results  Component Value Date   ALT 22 04/07/2021   AST 39 04/07/2021   ALKPHOS 72 04/07/2021   BILITOT 0.7 04/07/2021    No results found for: "CHOL", "HDL", "LDLCALC", "LDLDIRECT", "TRIG", "CHOLHDL" HIV 1 RNA Quant (Copies/mL)  Date Value  05/14/2022 47 (H)  05/01/2022 2,800 (H)   CD4 T Cell Abs (/uL)  Date Value  05/14/2022 1,287     Assessment & Plan:   Problem List Items Addressed This Visit        Unprioritized   Bipolar 1 disorder ([place])    She has ongoing counseling appointments set up - watch for changes after delivery for post partum period.       Supervision of high risk pregnancy, antepartum  Doing well with very low viral load in undetectable range < 50 copies. I will recheck this today for consideration of delivery planning. Discussed VL cut off to proceed with vaginal delivery - I suspect she will be a good candidate to proceed with medical IOL as planned.  Discussed IV antiviral administration with labor. Infant prophylaxis as directed by her neonatal team, suspect 4 weeks zidovudine.  She is [redacted]w[redacted]d today - I do recommend RSV vaccine for her as well given new CDC guidelines as such. Would try to get in soon as recommended <36w pregnancy ideally. GYN team hopefully can provide - if not [facility] is carrying it.        HIV (human immunodeficiency virus infection) ([place]) - Primary    Very well controlled on once daily tivicay and descovy taken together daily. No concerns with access or adherence to medication. They are tolerating the medication well without side effects. No drug interactions identified. Pertinent lab tests ordered today in preparation for upcoming delivery decisions. I suspect that she will continue to be under very good control with low VL.  No changes to insurance coverage.  No dental needs today.  Working with counseling team for mental health support Sexual health and family planning discussed - no needs today. BTL desired for future contraception. No desire to breastfeed and planning to bottle feed exclusively.  Vaccines updated today - see health maintenance section.    Return in about 4 months (around 11/15/2022).        Relevant Orders   HIV 1 RNA quant-no reflex-bld   QuantiFERON-TB Gold Plus   Hepatitis A Ab, Total   Janene Madeira, MSN, NP-C [facility] [place] Pager: [phone] Office:  [phone]  07/15/22  10:26 AM

## 2022-07-15 NOTE — Telephone Encounter (Signed)
Patient requesting a note for Light duty for work.

## 2022-07-15 NOTE — Assessment & Plan Note (Signed)
Doing well with very low viral load in undetectable range < 50 copies. I will recheck this today for consideration of delivery planning. Discussed VL cut off to proceed with vaginal delivery - I suspect she will be a good candidate to proceed with medical IOL as planned.  Discussed IV antiviral administration with labor. Infant prophylaxis as directed by her neonatal team, suspect 4 weeks zidovudine.  She is [redacted]w[redacted]d today - I do recommend RSV vaccine for her as well given new CDC guidelines as such. Would try to get in soon as recommended <36w pregnancy ideally. GYN team hopefully can provide - if not [facility] is carrying it.

## 2022-07-15 NOTE — Assessment & Plan Note (Signed)
Very well controlled on once daily tivicay and descovy taken together daily. No concerns with access or adherence to medication. They are tolerating the medication well without side effects. No drug interactions identified. Pertinent lab tests ordered today in preparation for upcoming delivery decisions. I suspect that she will continue to be under very good control with low VL.  No changes to insurance coverage.  No dental needs today.  Working with counseling team for mental health support Sexual health and family planning discussed - no needs today. BTL desired for future contraception. No desire to breastfeed and planning to bottle feed exclusively.  Vaccines updated today - see health maintenance section.    Return in about 4 months (around 11/15/2022).

## 2022-07-17 ENCOUNTER — Other Ambulatory Visit (HOSPITAL_COMMUNITY)
Admission: RE | Admit: 2022-07-17 | Discharge: 2022-07-17 | Disposition: A | Discharge status: Home / Self Care | Payer: Medicaid Other | Source: Ambulatory Visit | Attending: Obstetrics and Gynecology | Admitting: Obstetrics and Gynecology

## 2022-07-17 ENCOUNTER — Ambulatory Visit: Payer: Medicaid Other | Admitting: *Deleted

## 2022-07-17 ENCOUNTER — Encounter: Payer: Self-pay | Admitting: General Practice

## 2022-07-17 ENCOUNTER — Ambulatory Visit (INDEPENDENT_AMBULATORY_CARE_PROVIDER_SITE_OTHER): Payer: Medicaid Other | Admitting: Obstetrics and Gynecology

## 2022-07-17 ENCOUNTER — Ambulatory Visit: Payer: Medicaid Other | Attending: Maternal & Fetal Medicine

## 2022-07-17 ENCOUNTER — Other Ambulatory Visit: Payer: Self-pay

## 2022-07-17 VITALS — BP 119/72 | HR 108

## 2022-07-17 VITALS — BP 115/81 | HR 104 | Wt 145.0 lb

## 2022-07-17 DIAGNOSIS — O0933 Supervision of pregnancy with insufficient antenatal care, third trimester: Secondary | ICD-10-CM | POA: Diagnosis not present

## 2022-07-17 DIAGNOSIS — Z3A36 36 weeks gestation of pregnancy: Secondary | ICD-10-CM

## 2022-07-17 DIAGNOSIS — Z3689 Encounter for other specified antenatal screening: Secondary | ICD-10-CM | POA: Insufficient documentation

## 2022-07-17 DIAGNOSIS — O09292 Supervision of pregnancy with other poor reproductive or obstetric history, second trimester: Secondary | ICD-10-CM | POA: Insufficient documentation

## 2022-07-17 DIAGNOSIS — B20 Human immunodeficiency virus [HIV] disease: Secondary | ICD-10-CM

## 2022-07-17 DIAGNOSIS — F1910 Other psychoactive substance abuse, uncomplicated: Secondary | ICD-10-CM | POA: Diagnosis present

## 2022-07-17 DIAGNOSIS — O26892 Other specified pregnancy related conditions, second trimester: Secondary | ICD-10-CM

## 2022-07-17 DIAGNOSIS — O09893 Supervision of other high risk pregnancies, third trimester: Secondary | ICD-10-CM | POA: Insufficient documentation

## 2022-07-17 DIAGNOSIS — O0990 Supervision of high risk pregnancy, unspecified, unspecified trimester: Secondary | ICD-10-CM

## 2022-07-17 DIAGNOSIS — O98713 Human immunodeficiency virus [HIV] disease complicating pregnancy, third trimester: Secondary | ICD-10-CM

## 2022-07-17 DIAGNOSIS — Z141 Cystic fibrosis carrier: Secondary | ICD-10-CM

## 2022-07-17 DIAGNOSIS — O09293 Supervision of pregnancy with other poor reproductive or obstetric history, third trimester: Secondary | ICD-10-CM | POA: Diagnosis not present

## 2022-07-17 DIAGNOSIS — O99323 Drug use complicating pregnancy, third trimester: Secondary | ICD-10-CM

## 2022-07-17 DIAGNOSIS — Z6791 Unspecified blood type, Rh negative: Secondary | ICD-10-CM

## 2022-07-17 DIAGNOSIS — Z21 Asymptomatic human immunodeficiency virus [HIV] infection status: Secondary | ICD-10-CM

## 2022-07-17 NOTE — Progress Notes (Signed)
Patient had elevated gad7- has a counselor she sees weekly at [facility]

## 2022-07-17 NOTE — Telephone Encounter (Signed)
Patient came into office & concerns addressed

## 2022-07-17 NOTE — Progress Notes (Signed)
   PRENATAL VISIT NOTE  Subjective:  Nicole Morgan is a 26 y.o. G2P1001 at [redacted]w[redacted]d being seen today for ongoing prenatal care.  She is currently monitored for the following issues for this high-risk pregnancy and has Polysubstance abuse ([place]); Bipolar 1 disorder ([place]); Cystic fibrosis carrier; Supervision of high risk pregnancy, antepartum; HIV (human immunodeficiency virus infection) ([place]); History of cervical incompetence in pregnancy, currently pregnant, second trimester; Rh negative state in antepartum period, second trimester; and Unwanted fertility on their problem list.  Patient doing well with no acute concerns today. She reports no complaints.  Contractions: Irritability. Vag. Bleeding: None.  Movement: Present. Denies leaking of fluid.   The following portions of the patient's history were reviewed and updated as appropriate: allergies, current medications, past family history, past medical history, past social history, past surgical history and problem list. Problem list updated.  Objective:   Vitals:   07/17/22 0904  BP: 115/81  Pulse: (!) 104  Weight: 145 lb (65.8 kg)    Fetal Status: Fetal Heart Rate (bpm): 140 Fundal Height: 37 cm Movement: Present     General:  Alert, oriented and cooperative. Patient is in no acute distress.  Skin: Skin is warm and dry. No rash noted.   Cardiovascular: Normal heart rate noted  Respiratory: Normal respiratory effort, no problems with respiration noted  Abdomen: Soft, gravid, appropriate for gestational age.  Pain/Pressure: Present     Pelvic: Cervical exam performed Dilation: Fingertip Effacement (%): 50 Station: -2  Extremities: Normal range of motion.  Edema: None  Mental Status:  Normal mood and affect. Normal behavior. Normal judgment and thought content.   Assessment and Plan:  Pregnancy: G2P1001 at [redacted]w[redacted]d  1. Supervision of high risk pregnancy, antepartum Continue routine prenatal care Pt very much would like IOL at 39 weeks,  discuss and schedule at next visit BTL form to be signed today, pt advised if she delivers prior to 30 day window there is the possibility of needing interval tubal Growth scan with MFM today  - Culture, beta strep (group b only) - GC/Chlamydia probe amp ([place])not at [facility]  2. [redacted] weeks gestation of pregnancy   3. Polysubstance abuse ([place]) No report of current usage  4. Cystic fibrosis carrier   5. Asymptomatic HIV infection, with no history of HIV-related illness ([place]) Pt notes ID has confirmed she may pursue vaginal delivery. ID note reviewed Routine HIV protocol in labor  6. Rh negative state in antepartum period, second trimester Rhogam post delivery  Preterm labor symptoms and general obstetric precautions including but not limited to vaginal bleeding, contractions, leaking of fluid and fetal movement were reviewed in detail with the patient.  Please refer to After Visit Summary for other counseling recommendations.   Return in about 1 week (around 07/24/2022) for [facility], in person.   Lynnda Shields, MD Faculty Attending [facility]

## 2022-07-18 LAB — GC/CHLAMYDIA PROBE AMP (~~LOC~~) NOT AT ARMC
Chlamydia: NEGATIVE
Comment: NEGATIVE
Comment: NORMAL
Neisseria Gonorrhea: NEGATIVE

## 2022-07-20 LAB — HEPATITIS A ANTIBODY, TOTAL: Hepatitis A AB,Total: NONREACTIVE

## 2022-07-20 LAB — QUANTIFERON-TB GOLD PLUS
Mitogen-NIL: 8.32 IU/mL
NIL: 0.02 IU/mL
QuantiFERON-TB Gold Plus: NEGATIVE
TB1-NIL: 0 IU/mL
TB2-NIL: 0.01 IU/mL

## 2022-07-20 LAB — HIV-1 RNA QUANT-NO REFLEX-BLD
HIV 1 RNA Quant: NOT DETECTED Copies/mL
HIV-1 RNA Quant, Log: NOT DETECTED Log cps/mL

## 2022-07-21 ENCOUNTER — Encounter (HOSPITAL_COMMUNITY): Payer: Self-pay | Admitting: Obstetrics & Gynecology

## 2022-07-21 ENCOUNTER — Inpatient Hospital Stay (HOSPITAL_COMMUNITY)
Admission: AD | Admit: 2022-07-21 | Discharge: 2022-07-21 | Disposition: A | Discharge status: Home / Self Care | Payer: Medicaid Other | Attending: Obstetrics & Gynecology | Admitting: Obstetrics & Gynecology

## 2022-07-21 DIAGNOSIS — R109 Unspecified abdominal pain: Secondary | ICD-10-CM | POA: Insufficient documentation

## 2022-07-21 DIAGNOSIS — O99333 Smoking (tobacco) complicating pregnancy, third trimester: Secondary | ICD-10-CM | POA: Diagnosis not present

## 2022-07-21 DIAGNOSIS — Z91148 Patient's other noncompliance with medication regimen for other reason: Secondary | ICD-10-CM | POA: Insufficient documentation

## 2022-07-21 DIAGNOSIS — O26893 Other specified pregnancy related conditions, third trimester: Secondary | ICD-10-CM | POA: Diagnosis present

## 2022-07-21 DIAGNOSIS — Z3A36 36 weeks gestation of pregnancy: Secondary | ICD-10-CM | POA: Diagnosis not present

## 2022-07-21 DIAGNOSIS — R410 Disorientation, unspecified: Secondary | ICD-10-CM | POA: Diagnosis not present

## 2022-07-21 DIAGNOSIS — O99343 Other mental disorders complicating pregnancy, third trimester: Secondary | ICD-10-CM | POA: Insufficient documentation

## 2022-07-21 DIAGNOSIS — M545 Low back pain, unspecified: Secondary | ICD-10-CM | POA: Diagnosis not present

## 2022-07-21 DIAGNOSIS — N949 Unspecified condition associated with female genital organs and menstrual cycle: Secondary | ICD-10-CM | POA: Diagnosis not present

## 2022-07-21 DIAGNOSIS — F17290 Nicotine dependence, other tobacco product, uncomplicated: Secondary | ICD-10-CM | POA: Diagnosis not present

## 2022-07-21 DIAGNOSIS — O3433 Maternal care for cervical incompetence, third trimester: Secondary | ICD-10-CM | POA: Insufficient documentation

## 2022-07-21 LAB — URINALYSIS, ROUTINE W REFLEX MICROSCOPIC
Bilirubin Urine: NEGATIVE
Glucose, UA: NEGATIVE mg/dL
Hgb urine dipstick: NEGATIVE
Ketones, ur: NEGATIVE mg/dL
Leukocytes,Ua: NEGATIVE
Nitrite: NEGATIVE
Protein, ur: NEGATIVE mg/dL
Specific Gravity, Urine: 1.014 (ref 1.005–1.030)
pH: 5 (ref 5.0–8.0)

## 2022-07-21 LAB — WET PREP, GENITAL
Clue Cells Wet Prep HPF POC: NONE SEEN
Sperm: NONE SEEN
Trich, Wet Prep: NONE SEEN
WBC, Wet Prep HPF POC: >=10 — AB (ref <10)
Yeast Wet Prep HPF POC: NONE SEEN

## 2022-07-21 LAB — CULTURE, BETA STREP (GROUP B ONLY): Strep Gp B Culture: NEGATIVE

## 2022-07-21 MED ORDER — ACETAMINOPHEN 325 MG PO TABS: 650.0000 mg | ORAL_TABLET | ORAL | 0 refills | Status: DC | PRN

## 2022-07-21 MED ORDER — CYCLOBENZAPRINE HCL 10 MG PO TABS: 10.0000 mg | ORAL_TABLET | Freq: Two times a day (BID) | ORAL | 0 refills | Status: DC | PRN
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
## 2022-07-21 NOTE — MAU Note (Signed)
Pt denies contractions or cramping - reports low -mid back-constant pain with occasional sharp shooting pain-concentrated on R side

## 2022-07-21 NOTE — MAU Provider Note (Signed)
History     CSN: [id number]  Arrival date and time: 07/21/22 0254   Event Date/Time   First Provider Initiated Contact with Patient 07/21/22 2052      Chief Complaint  Patient presents with   Abdominal Pain   Back Pain   Vaginal Discharge   HPI Nicole Morgan is a age G79P1001 at [redacted]w[redacted]d who presents to MAU with chief complaints of lower abdominal and low back pain. These are new problems, onset last night 07/20/2022 in the evening. Pain score is 6/10. She denies aggravating or alleviating factors. She has not taken medication for this complaint as she chooses to avoid taking medicine. She declines pain medication in MAU.  Patient also reports abnormal vaginal discharge. Uncertain onset. She does not believe her membranes have ruptured. She denies feeling a gush of fluid and denies leaking of fluid.  Patient verbalizes mild confusion and frustration as she feels "miserable" and did not have any of the aforementioned complaints at any time in her previous pregnancy.    OB History     Gravida  2   Para  1   Term  1   Preterm  0   AB  0   Living  1      SAB  0   IAB  0   Ectopic  0   Multiple  0   Live Births  1           Past Medical History:  Diagnosis Date   Anxiety 2020   Bipolar 1 disorder ([facility]) 2020   Cystic fibrosis carrier    Depression    no meds   Eczema    HIV (human immunodeficiency virus infection) ([facility]) 04/2022   Incompetent cervix    Insomnia 2020   PTSD (post-traumatic stress disorder) 2020   UTI (urinary tract infection)     Past Surgical History:  Procedure Laterality Date   CERVICAL CERCLAGE  06/06/2012   Procedure: CERCLAGE CERVICAL;  Surgeon: Bing Plume, MD;  Location: [facility];  Service: Gynecology;  Laterality: N/A;   PLACEMENT OF BREAST IMPLANTS     WISDOM TOOTH EXTRACTION      Family History  Problem Relation Age of Onset   Bipolar disorder Mother        schizophrenic   Mental illness Mother         schizophrenia   Deep vein thrombosis Maternal Aunt    Alcohol abuse Maternal Grandmother    Mental illness Maternal Grandmother    Bipolar disorder Maternal Grandmother        schizophrenic    Cancer Maternal Grandmother        lymph nodes   Migraines Neg Hx    Headache Neg Hx     Social History   Tobacco Use   Smoking status: Former    Types: E-cigarettes    Quit date: 04/2022    Years since quitting: 0.2   Smokeless tobacco: Never  Vaping Use   Vaping Use: Former  Substance Use Topics   Alcohol use: Not Currently    Comment: 1-2 beers every now and then   Drug use: Not Currently    Types: Marijuana    Comment: last smoked 03/30/2022    Allergies: No Known Allergies  Medications Prior to Admission  Medication Sig Dispense Refill Last Dose   dolutegravir (TIVICAY) 50 MG tablet Take 1 tablet (50 mg total) by mouth daily. 30 tablet 11 07/21/2022 at 0900   emtricitabine-tenofovir AF (DESCOVY)  200-25 MG tablet Take 1 tablet by mouth daily. 30 tablet 11 07/21/2022 at 0900   Prenatal Vit-Fe Fumarate-FA (MULTIVITAMIN-PRENATAL) 27-0.8 MG TABS tablet Take 1 tablet by mouth daily at 12 noon.   07/21/2022    Review of Systems  Gastrointestinal:  Positive for abdominal pain.  Genitourinary:  Positive for vaginal discharge.  Musculoskeletal:  Positive for back pain.  All other systems reviewed and are negative.  Physical Exam   Blood pressure 117/77, pulse (!) 121, temperature 98.1 F (36.7 C), temperature source Oral, resp. rate 18, height 5\' 1"  (1.549 m), weight 67.3 kg, last menstrual period 11/06/2021, SpO2 98 %, unknown if currently breastfeeding.  Physical Exam Vitals and nursing note reviewed. Exam conducted with a chaperone present.  Constitutional:      Appearance: She is well-developed. She is not ill-appearing.     Comments: Patient resting calmly in bed in supine position  Cardiovascular:     Rate and Rhythm: Tachycardia present.  Pulmonary:     Effort:  Pulmonary effort is normal.  Abdominal:     Comments: Gravid  Skin:    Capillary Refill: Capillary refill takes less than 2 seconds.  Neurological:     Mental Status: She is alert and oriented to person, place, and time.  Psychiatric:        Mood and Affect: Mood normal.        Behavior: Behavior normal.     MAU Course  Procedures  MDM  --Reactive tracing: initially maternal and fetal tachycardia, normalizing with rest. No evidence of illness or dehydration to suggest more acute diagnosis --Patient declines medication including IV fluid bolus for initial fetal and maternal tachycardia --Patient agreeable to hot packs pain sites while waiting for lab results --Baseline 150, mod var, 15 x 15 accels, no decels --Toco: uterine irritability --Cervix FT/thick/ballotable (unchanged from office)  Orders Placed This Encounter  Procedures   Wet prep, genital   Urinalysis, Routine w reflex microscopic Urine, Clean Catch   Discharge patient   Patient Vitals for the past 24 hrs:  BP Temp Temp src Pulse Resp SpO2 Height Weight  07/21/22 2112 116/78 98.1 F (36.7 C) Oral 97 17 99 % -- --  07/21/22 1954 114/77 -- -- (!) 116 -- -- -- --  07/21/22 1939 117/77 98.1 F (36.7 C) Oral (!) 121 18 98 % 5\' 1"  (1.549 m) 67.3 kg   Results for orders placed or performed during the hospital encounter of 07/21/22 (from the past 24 hour(s))  Urinalysis, Routine w reflex microscopic Urine, Clean Catch     Status: Abnormal   Collection Time: 07/21/22  8:20 PM  Result Value Ref Range   Color, Urine YELLOW YELLOW   APPearance HAZY (A) CLEAR   Specific Gravity, Urine 1.014 1.005 - 1.030   pH 5.0 5.0 - 8.0   Glucose, UA NEGATIVE NEGATIVE mg/dL   Hgb urine dipstick NEGATIVE NEGATIVE   Bilirubin Urine NEGATIVE NEGATIVE   Ketones, ur NEGATIVE NEGATIVE mg/dL   Protein, ur NEGATIVE NEGATIVE mg/dL   Nitrite NEGATIVE NEGATIVE   Leukocytes,Ua NEGATIVE NEGATIVE  Wet prep, genital     Status: Abnormal    Collection Time: 07/21/22  8:34 PM   Specimen: Urine, Clean Catch  Result Value Ref Range   Yeast Wet Prep HPF POC NONE SEEN NONE SEEN   Trich, Wet Prep NONE SEEN NONE SEEN   Clue Cells Wet Prep HPF POC NONE SEEN NONE SEEN   WBC, Wet Prep HPF POC >=10 (A) <10  Sperm NONE SEEN    Meds ordered this encounter  Medications   acetaminophen (TYLENOL) 325 MG tablet    Sig: Take 2 tablets (650 mg total) by mouth every 4 (four) hours as needed for moderate pain.    Dispense:  180 tablet    Refill:  0    Order Specific Question:   Supervising Provider    Answer:   Duane Lope H [[id number]]   cyclobenzaprine (FLEXERIL) 10 MG tablet    Sig: Take 1 tablet (10 mg total) by mouth 2 (two) times daily as needed for muscle spasms.    Dispense:  20 tablet    Refill:  0    Order Specific Question:   Supervising Provider    Answer:   Duane Lope H [[id number]]   Assessment and Plan  --26 y.o. G2P1001 at [redacted]w[redacted]d  --Reactive tracing, not laboring --No acute complaints or  findings --Physiologic discomforts of third trimester --Pain medicine declined during encounter --Patient accepts outpatient prescriptions --Discharge home in stable condition  Calvert Cantor, MSA, MSN, CNM

## 2022-07-21 NOTE — MAU Note (Signed)
Pitcher of water provided  for po hydration

## 2022-07-22 LAB — GC/CHLAMYDIA PROBE AMP (~~LOC~~) NOT AT ARMC
Chlamydia: NEGATIVE
Comment: NEGATIVE
Comment: NORMAL
Neisseria Gonorrhea: NEGATIVE

## 2022-07-24 ENCOUNTER — Ambulatory Visit (INDEPENDENT_AMBULATORY_CARE_PROVIDER_SITE_OTHER): Payer: Medicaid Other | Admitting: Family Medicine

## 2022-07-24 ENCOUNTER — Encounter: Payer: Self-pay | Admitting: Family Medicine

## 2022-07-24 ENCOUNTER — Encounter (HOSPITAL_COMMUNITY): Payer: Self-pay | Admitting: *Deleted

## 2022-07-24 ENCOUNTER — Ambulatory Visit (HOSPITAL_BASED_OUTPATIENT_CLINIC_OR_DEPARTMENT_OTHER): Payer: Medicaid Other | Admitting: *Deleted

## 2022-07-24 ENCOUNTER — Other Ambulatory Visit: Payer: Self-pay

## 2022-07-24 ENCOUNTER — Encounter (HOSPITAL_COMMUNITY): Payer: Self-pay | Admitting: Obstetrics and Gynecology

## 2022-07-24 ENCOUNTER — Ambulatory Visit (HOSPITAL_BASED_OUTPATIENT_CLINIC_OR_DEPARTMENT_OTHER): Payer: Medicaid Other

## 2022-07-24 ENCOUNTER — Inpatient Hospital Stay (HOSPITAL_COMMUNITY)
Admission: AD | Admit: 2022-07-24 | Discharge: 2022-07-26 | DRG: 806 | Disposition: A | Discharge status: Home / Self Care | Payer: Medicaid Other | Attending: Obstetrics and Gynecology | Admitting: Obstetrics and Gynecology

## 2022-07-24 ENCOUNTER — Ambulatory Visit: Payer: Medicaid Other | Admitting: *Deleted

## 2022-07-24 ENCOUNTER — Telehealth (HOSPITAL_COMMUNITY): Payer: Self-pay | Admitting: *Deleted

## 2022-07-24 ENCOUNTER — Inpatient Hospital Stay (HOSPITAL_COMMUNITY): Payer: Medicaid Other | Admitting: Anesthesiology

## 2022-07-24 VITALS — BP 112/78 | HR 93

## 2022-07-24 VITALS — BP 111/73 | HR 99 | Wt 148.0 lb

## 2022-07-24 DIAGNOSIS — O4202 Full-term premature rupture of membranes, onset of labor within 24 hours of rupture: Secondary | ICD-10-CM | POA: Diagnosis not present

## 2022-07-24 DIAGNOSIS — Z23 Encounter for immunization: Secondary | ICD-10-CM | POA: Diagnosis not present

## 2022-07-24 DIAGNOSIS — Z148 Genetic carrier of other disease: Secondary | ICD-10-CM

## 2022-07-24 DIAGNOSIS — O9872 Human immunodeficiency virus [HIV] disease complicating childbirth: Secondary | ICD-10-CM | POA: Diagnosis present

## 2022-07-24 DIAGNOSIS — F1290 Cannabis use, unspecified, uncomplicated: Secondary | ICD-10-CM | POA: Diagnosis present

## 2022-07-24 DIAGNOSIS — Z6791 Unspecified blood type, Rh negative: Secondary | ICD-10-CM

## 2022-07-24 DIAGNOSIS — O98719 Human immunodeficiency virus [HIV] disease complicating pregnancy, unspecified trimester: Secondary | ICD-10-CM | POA: Diagnosis present

## 2022-07-24 DIAGNOSIS — O0990 Supervision of high risk pregnancy, unspecified, unspecified trimester: Secondary | ICD-10-CM | POA: Insufficient documentation

## 2022-07-24 DIAGNOSIS — O98713 Human immunodeficiency virus [HIV] disease complicating pregnancy, third trimester: Secondary | ICD-10-CM

## 2022-07-24 DIAGNOSIS — O0933 Supervision of pregnancy with insufficient antenatal care, third trimester: Secondary | ICD-10-CM

## 2022-07-24 DIAGNOSIS — O09893 Supervision of other high risk pregnancies, third trimester: Secondary | ICD-10-CM

## 2022-07-24 DIAGNOSIS — Z21 Asymptomatic human immunodeficiency virus [HIV] infection status: Secondary | ICD-10-CM | POA: Diagnosis present

## 2022-07-24 DIAGNOSIS — Z3689 Encounter for other specified antenatal screening: Secondary | ICD-10-CM

## 2022-07-24 DIAGNOSIS — Z141 Cystic fibrosis carrier: Secondary | ICD-10-CM

## 2022-07-24 DIAGNOSIS — Z87891 Personal history of nicotine dependence: Secondary | ICD-10-CM

## 2022-07-24 DIAGNOSIS — O99323 Drug use complicating pregnancy, third trimester: Secondary | ICD-10-CM | POA: Insufficient documentation

## 2022-07-24 DIAGNOSIS — F1910 Other psychoactive substance abuse, uncomplicated: Secondary | ICD-10-CM

## 2022-07-24 DIAGNOSIS — Z3A37 37 weeks gestation of pregnancy: Secondary | ICD-10-CM

## 2022-07-24 DIAGNOSIS — O26892 Other specified pregnancy related conditions, second trimester: Secondary | ICD-10-CM

## 2022-07-24 DIAGNOSIS — O99324 Drug use complicating childbirth: Secondary | ICD-10-CM | POA: Diagnosis present

## 2022-07-24 DIAGNOSIS — O09292 Supervision of pregnancy with other poor reproductive or obstetric history, second trimester: Secondary | ICD-10-CM

## 2022-07-24 DIAGNOSIS — O09293 Supervision of pregnancy with other poor reproductive or obstetric history, third trimester: Secondary | ICD-10-CM

## 2022-07-24 DIAGNOSIS — O4103X0 Oligohydramnios, third trimester, not applicable or unspecified: Secondary | ICD-10-CM | POA: Insufficient documentation

## 2022-07-24 DIAGNOSIS — F319 Bipolar disorder, unspecified: Secondary | ICD-10-CM

## 2022-07-24 DIAGNOSIS — Z3009 Encounter for other general counseling and advice on contraception: Secondary | ICD-10-CM

## 2022-07-24 DIAGNOSIS — O0993 Supervision of high risk pregnancy, unspecified, third trimester: Secondary | ICD-10-CM

## 2022-07-24 DIAGNOSIS — O26893 Other specified pregnancy related conditions, third trimester: Secondary | ICD-10-CM | POA: Diagnosis present

## 2022-07-24 DIAGNOSIS — B20 Human immunodeficiency virus [HIV] disease: Secondary | ICD-10-CM

## 2022-07-24 LAB — CBC
HCT: 32.5 % — ABNORMAL LOW (ref 36.0–46.0)
Hemoglobin: 10.9 g/dL — ABNORMAL LOW (ref 12.0–15.0)
MCH: 29.2 pg (ref 26.0–34.0)
MCHC: 33.5 g/dL (ref 30.0–36.0)
MCV: 87.1 fL (ref 80.0–100.0)
Platelets: 441 10*3/uL — ABNORMAL HIGH (ref 150–400)
RBC: 3.73 MIL/uL — ABNORMAL LOW (ref 3.87–5.11)
RDW: 14.1 % (ref 11.5–15.5)
WBC: 14.2 10*3/uL — ABNORMAL HIGH (ref 4.0–10.5)
nRBC: 0 % (ref 0.0–0.2)

## 2022-07-24 LAB — TYPE AND SCREEN
ABO/RH(D): O NEG
Antibody Screen: POSITIVE

## 2022-07-24 MED ORDER — LIDOCAINE-EPINEPHRINE (PF) 2 %-1:200000 IJ SOLN
INTRAMUSCULAR | Status: DC | PRN
  Administered 2022-07-24: 5 mL via EPIDURAL

## 2022-07-24 MED ORDER — MISOPROSTOL 50MCG HALF TABLET
50.0000 ug | ORAL_TABLET | Freq: Once | ORAL | Status: AC
  Administered 2022-07-24: 50 ug via ORAL
  Filled 2022-07-24: qty 1

## 2022-07-24 MED ORDER — OXYTOCIN BOLUS FROM INFUSION
333.0000 mL | Freq: Once | INTRAVENOUS | Status: AC
  Administered 2022-07-25: 333 mL via INTRAVENOUS

## 2022-07-24 MED ORDER — LIDOCAINE HCL (PF) 1 % IJ SOLN: 30.0000 mL | INTRAMUSCULAR | Status: DC | PRN

## 2022-07-24 MED ORDER — LACTATED RINGERS IV SOLN
500.0000 mL | Freq: Once | INTRAVENOUS | Status: AC
  Administered 2022-07-24: 500 mL via INTRAVENOUS

## 2022-07-24 MED ORDER — OXYTOCIN-SODIUM CHLORIDE 30-0.9 UT/500ML-% IV SOLN
2.5000 [IU]/h | INTRAVENOUS | Status: DC
  Filled 2022-07-24: qty 500

## 2022-07-24 MED ORDER — LACTATED RINGERS IV SOLN: 500.0000 mL | INTRAVENOUS | Status: DC | PRN

## 2022-07-24 MED ORDER — EPHEDRINE 5 MG/ML INJ: 10.0000 mg | INTRAVENOUS | Status: DC | PRN

## 2022-07-24 MED ORDER — OXYTOCIN BOLUS FROM INFUSION: 333.0000 mL | Freq: Once | INTRAVENOUS | Status: DC

## 2022-07-24 MED ORDER — PHENYLEPHRINE 80 MCG/ML (10ML) SYRINGE FOR IV PUSH (FOR BLOOD PRESSURE SUPPORT): 80.0000 ug | PREFILLED_SYRINGE | INTRAVENOUS | Status: DC | PRN

## 2022-07-24 MED ORDER — SOD CITRATE-CITRIC ACID 500-334 MG/5ML PO SOLN: 30.0000 mL | ORAL | Status: DC | PRN

## 2022-07-24 MED ORDER — ACETAMINOPHEN 325 MG PO TABS: 650.0000 mg | ORAL_TABLET | ORAL | Status: DC | PRN

## 2022-07-24 MED ORDER — LACTATED RINGERS IV SOLN: INTRAVENOUS | Status: DC

## 2022-07-24 MED ORDER — ONDANSETRON HCL 4 MG/2ML IJ SOLN: 4.0000 mg | Freq: Four times a day (QID) | INTRAMUSCULAR | Status: DC | PRN

## 2022-07-24 MED ORDER — FENTANYL-BUPIVACAINE-NACL 0.5-0.125-0.9 MG/250ML-% EP SOLN
12.0000 mL/h | EPIDURAL | Status: DC | PRN
  Administered 2022-07-24: 12 mL/h via EPIDURAL
  Filled 2022-07-24: qty 250

## 2022-07-24 MED ORDER — FENTANYL CITRATE (PF) 100 MCG/2ML IJ SOLN: 50.0000 ug | INTRAMUSCULAR | Status: DC | PRN

## 2022-07-24 MED ORDER — DIPHENHYDRAMINE HCL 50 MG/ML IJ SOLN: 12.5000 mg | INTRAMUSCULAR | Status: DC | PRN

## 2022-07-24 MED ORDER — MISOPROSTOL 25 MCG QUARTER TABLET
25.0000 ug | ORAL_TABLET | Freq: Once | ORAL | Status: AC
  Administered 2022-07-24: 25 ug via VAGINAL
  Filled 2022-07-24: qty 1

## 2022-07-24 MED ORDER — ZIDOVUDINE 10 MG/ML IV SOLN
1.0000 mg/kg/h | INTRAVENOUS | Status: DC
  Administered 2022-07-24 – 2022-07-25 (×2): 1 mg/kg/h via INTRAVENOUS
  Filled 2022-07-24 (×3): qty 40

## 2022-07-24 MED ORDER — OXYTOCIN-SODIUM CHLORIDE 30-0.9 UT/500ML-% IV SOLN: 2.5000 [IU]/h | INTRAVENOUS | Status: DC

## 2022-07-24 MED ORDER — ZIDOVUDINE 10 MG/ML IV SOLN
2.0000 mg/kg | Freq: Once | INTRAVENOUS | Status: AC
  Administered 2022-07-24: 134 mg via INTRAVENOUS
  Filled 2022-07-24: qty 13.4

## 2022-07-24 NOTE — Anesthesia Preprocedure Evaluation (Signed)
Anesthesia Evaluation  Patient identified by MRN, date of birth, ID band Patient awake    Reviewed: Allergy & Precautions, NPO status , Patient's Chart, lab work & pertinent test results  Airway Mallampati: II  TM Distance: >3 FB Neck ROM: Full    Dental no notable dental hx.    Pulmonary Current Smoker and Patient abstained from smoking.,    Pulmonary exam normal breath sounds clear to auscultation       Cardiovascular negative cardio ROS Normal cardiovascular exam Rhythm:Regular Rate:Normal     Neuro/Psych PSYCHIATRIC DISORDERS Anxiety Depression Bipolar Disorder negative neurological ROS     GI/Hepatic negative GI ROS, (+)     substance abuse  marijuana use,   Endo/Other  negative endocrine ROS  Renal/GU negative Renal ROS  negative genitourinary   Musculoskeletal negative musculoskeletal ROS (+)   Abdominal   Peds  Hematology  (+) HIV,   Anesthesia Other Findings   Reproductive/Obstetrics (+) Pregnancy                             Anesthesia Physical Anesthesia Plan  ASA: 3  Anesthesia Plan: Epidural   Post-op Pain Management:    Induction:   PONV Risk Score and Plan: Treatment may vary due to age or medical condition  Airway Management Planned: Natural Airway  Additional Equipment:   Intra-op Plan:   Post-operative Plan:   Informed Consent: I have reviewed the patients History and Physical, chart, labs and discussed the procedure including the risks, benefits and alternatives for the proposed anesthesia with the patient or authorized representative who has indicated his/her understanding and acceptance.       Plan Discussed with: Anesthesiologist  Anesthesia Plan Comments: (Patient identified. Risks, benefits, options discussed with patient including but not limited to bleeding, infection, nerve damage, paralysis, failed block, incomplete pain control, headache,  blood pressure changes, nausea, vomiting, reactions to medication, itching, and post partum back pain. Confirmed with bedside nurse the patient's most recent platelet count. Confirmed with the patient that they are not taking any anticoagulation, have any bleeding history or any family history of bleeding disorders. Patient expressed understanding and wishes to proceed. All questions were answered. )        Anesthesia Quick Evaluation

## 2022-07-24 NOTE — Anesthesia Procedure Notes (Signed)
Epidural Patient location during procedure: OB Start time: 07/24/2022 9:55 PM End time: 07/24/2022 10:05 PM  Staffing Anesthesiologist: Freddrick March, MD Performed: anesthesiologist   Preanesthetic Checklist Completed: patient identified, IV checked, risks and benefits discussed, monitors and equipment checked, pre-op evaluation and timeout performed  Epidural Patient position: sitting Prep: DuraPrep and site prepped and draped Patient monitoring: continuous pulse ox, blood pressure, heart rate and cardiac monitor Approach: midline Location: L3-L4 Injection technique: LOR air  Needle:  Needle type: Tuohy  Needle gauge: 17 G Needle length: 9 cm Needle insertion depth: 4.5 cm Catheter type: closed end flexible Catheter size: 19 Gauge Catheter at skin depth: 10 cm Test dose: negative  Assessment Sensory level: T8 Events: blood not aspirated, injection not painful, no injection resistance, no paresthesia and negative IV test  Additional Notes Patient identified. Risks/Benefits/Options discussed with patient including but not limited to bleeding, infection, nerve damage, paralysis, failed block, incomplete pain control, headache, blood pressure changes, nausea, vomiting, reactions to medication both or allergic, itching and postpartum back pain. Confirmed with bedside nurse the patient's most recent platelet count. Confirmed with patient that they are not currently taking any anticoagulation, have any bleeding history or any family history of bleeding disorders. Patient expressed understanding and wished to proceed. All questions were answered. Sterile technique was used throughout the entire procedure. Please see nursing notes for vital signs. Test dose was given through epidural catheter and negative prior to continuing to dose epidural or start infusion. Warning signs of high block given to the patient including shortness of breath, tingling/numbness in hands, complete motor  block, or any concerning symptoms with instructions to call for help. Patient was given instructions on fall risk and not to get out of bed. All questions and concerns addressed with instructions to call with any issues or inadequate analgesia.  Reason for block:procedure for pain

## 2022-07-24 NOTE — Patient Instructions (Signed)

## 2022-07-24 NOTE — Procedures (Signed)
Nicole Morgan August 07, 1996 [redacted]w[redacted]d  Fetus A Non-Stress Test Interpretation for 07/24/22  Indication: Oligohydramnios  Fetal Heart Rate A Mode: External Baseline Rate (A): 155 bpm Variability: Moderate Accelerations: 15 x 15 Decelerations: None Multiple birth?: No  Uterine Activity Mode: Toco Contraction Frequency (min): 1 u/c during NST Contraction Duration (sec): 60 Contraction Quality: Mild (not felt by pt) Resting Tone Palpated: Relaxed  Interpretation (Fetal Testing) Nonstress Test Interpretation: Reactive Overall Impression: Reassuring for gestational age Comments: tracing reviewed by Dr. Gertie Exon

## 2022-07-24 NOTE — Telephone Encounter (Signed)
Preadmission screen  

## 2022-07-24 NOTE — H&P (Addendum)
OBSTETRIC ADMISSION HISTORY AND PHYSICAL   Nicole Morgan is a 26 y.o. female G2P1001 with IUP at [redacted]w[redacted]d by LMP presenting for IOL 2/2 oligohydramnios. She reports +FMs, No LOF, no VB, no blurry vision, headaches or peripheral edema, and RUQ pain.  She plans on bottle feeding. She request BTL for birth control. She received her prenatal care at  [place]: By LMP --->  Estimated Date of Delivery: 08/13/22   Sono:     @[redacted]w[redacted]d , CWD, normal anatomy, cephalic presentation, left lateral bi-lobed placenta, 2881g, 54% EFW     Prenatal History/Complications:  -HIV+ (Descovy 200-25mg  daily, Tivicay 50 mg daily) -Hx of polysubstance abuse, currently controlled  -Cystic fibrosis carrier -Hx of cervical incompetence in prior pregnancy -Rh neg, rhogam 9/22 -Bipolar 1, not on medication       Past Medical History:     Past Medical History:  Diagnosis Date   Anxiety 2020   Bipolar 1 disorder ([place]) 2020   Cystic fibrosis carrier     Depression      no meds   Eczema     HIV (human immunodeficiency virus infection) ([place]) 04/2022   Incompetent cervix     Insomnia 2020   PTSD (post-traumatic stress disorder) 2020   UTI (urinary tract infection)        Past Surgical History:      Past Surgical History:  Procedure Laterality Date   CERVICAL CERCLAGE   06/06/2012    Procedure: CERCLAGE CERVICAL;  Surgeon: Melina Schools, MD;  Location: [facility];  Service: Gynecology;  Laterality: N/A;   PLACEMENT OF BREAST IMPLANTS       WISDOM TOOTH EXTRACTION          Obstetrical History: OB History       Gravida  2   Para  1   Term  1   Preterm  0   AB  0   Living  1        SAB  0   IAB  0   Ectopic  0   Multiple  0   Live Births  1               Social History Social History         Socioeconomic History   Marital status: Single      Spouse name: Not on file   Number of children: 1   Years of education: Not on file   Highest education level: Some college, no  degree  Occupational History   Not on file  Tobacco Use   Smoking status: Former      Types: E-cigarettes      Quit date: 04/2022      Years since quitting: 0.2   Smokeless tobacco: Never  Vaping Use   Vaping Use: Former  Substance and Sexual Activity   Alcohol use: Not Currently      Comment: 1-2 beers every now and then   Drug use: Not Currently      Types: Marijuana      Comment: last smoked 03/30/2022   Sexual activity: Not Currently  Other Topics Concern   Not on file  Social History Narrative    Lives with child     Right handed    Caffeine: about 3 cups/day maybe more    Social Determinants of Health        Financial Resource Strain: Medium Risk (05/16/2022)    Overall Financial Resource Strain (CARDIA)  Difficulty of Paying Living Expenses: Somewhat hard  Food Insecurity: Food Insecurity Present (05/16/2022)    Hunger Vital Sign     Worried About Running Out of Food in the Last Year: Sometimes true     Ran Out of Food in the Last Year: Sometimes true  Transportation Needs: No Transportation Needs (05/16/2022)    PRAPARE - Armed forces logistics/support/administrative officer (Medical): No     Lack of Transportation (Non-Medical): No  Physical Activity: Sufficiently Active (05/16/2022)    Exercise Vital Sign     Days of Exercise per Week: 7 days     Minutes of Exercise per Session: 30 min  Stress: Stress Concern Present (05/16/2022)    [place]     Feeling of Stress : Very much  Social Connections: Moderately Integrated (05/16/2022)    Social Connection and Isolation Panel [NHANES]     Frequency of Communication with Friends and Family: More than three times a week     Frequency of Social Gatherings with Friends and Family: Three times a week     Attends Religious Services: More than 4 times per year     Active Member of Clubs or Organizations: Yes     Attends Music therapist: More than 4 times  per year     Marital Status: Never married      Family History:      Family History  Problem Relation Age of Onset   Bipolar disorder Mother          schizophrenic   Mental illness Mother          schizophrenia   Deep vein thrombosis Maternal Aunt     Alcohol abuse Maternal Grandmother     Mental illness Maternal Grandmother     Bipolar disorder Maternal Grandmother          schizophrenic    Cancer Maternal Grandmother          lymph nodes   Migraines Neg Hx     Headache Neg Hx        Allergies: No Known Allergies          Medications Prior to Admission  Medication Sig Dispense Refill Last Dose   acetaminophen (TYLENOL) 325 MG tablet Take 2 tablets (650 mg total) by mouth every 4 (four) hours as needed for moderate pain. (Patient not taking: Reported on 07/24/2022) 180 tablet 0     cyclobenzaprine (FLEXERIL) 10 MG tablet Take 1 tablet (10 mg total) by mouth 2 (two) times daily as needed for muscle spasms. 20 tablet 0     dolutegravir (TIVICAY) 50 MG tablet Take 1 tablet (50 mg total) by mouth daily. 30 tablet 11     emtricitabine-tenofovir AF (DESCOVY) 200-25 MG tablet Take 1 tablet by mouth daily. 30 tablet 11     Prenatal Vit-Fe Fumarate-FA (MULTIVITAMIN-PRENATAL) 27-0.8 MG TABS tablet Take 1 tablet by mouth daily at 12 noon.              Review of Systems    All systems reviewed and negative except as stated in HPI   Last menstrual period 11/06/2021, unknown if currently breastfeeding. General appearance: alert and cooperative Lungs: clear to auscultation bilaterally Heart: regular rate and rhythm Abdomen: soft, non-tender; bowel sounds normal Extremities: Homans sign is negative, no sign of DVT Presentation: cephalic confirmed by ultrasound Fetal monitoring Baseline 150 bpm, moderate variability, +accels, - decels Uterine activity irregular  contractions     Prenatal labs: ABO, Rh: --/--/O NEG (09/22 1256) Antibody: POS (09/22 1256) Rubella: Nonimmune (06/28  0000) RPR: Non Reactive (08/25 0858)  HBsAg: Negative (06/28 0000)  HIV: Preliminary Reactive (08/25 0858)  GBS: Negative/-- (10/25 1023)  1 hr Glucola 86 Genetic screening  CF carrier, LR female Anatomy US wnl   Prenatal Transfer Tool  Maternal Diabetes: No Genetic Screening: Normal Maternal Ultrasounds/Referrals: Other: [place] Fetal Ultrasounds or other Referrals:  Referred to Materal Fetal Medicine  Maternal Substance Abuse:  Yes:  Type: Marijuana, hx of other drug use over 1 year ago  Significant Maternal Medications:  Meds include: Other:  Descovy, Tivicay Significant Maternal Lab Results:  Group B Strep negative, Rh negative, and HIV positive Number of Prenatal Visits:greater than 3 verified prenatal visits Other Comments:  Last VL 07/15/2022 was 47   No results found for this or any previous visit (from the past 24 hour(s)).       Patient Active Problem List    Diagnosis Date Noted   Unwanted fertility 06/26/2022   History of cervical incompetence in pregnancy, currently pregnant, second trimester 05/14/2022   Rh negative state in antepartum period, second trimester 05/14/2022   HIV (human immunodeficiency virus infection) ([place]) 05/01/2022   Cystic fibrosis carrier 04/09/2022   Supervision of high risk pregnancy, antepartum 04/09/2022   Polysubstance abuse ([place]) 04/08/2021   Bipolar 1 disorder ([place]) 04/08/2021      Assessment/Plan:  Nicole Morgan is a 26 y.o. G2P1001 at [redacted]w[redacted]d here for here for oligohydramnios suspected AFI of 3, results from Today's scan pending.   HIV+  Diagnosed this pregnancy.  -Start IV antiviral infusion  Polysubstance abuse, reports THC use during pregnancy -collect UDS -SW consult for continued resources  Rh neg, received rhogam prenatally -pp work up  #Labor: Starting with cytotec 50/25, continue to monitor #Pain:  Plan for epidural  #FWB: Cat 1  #ID:    GBS neg, HIV+ continue medications during labor  #MOF: Bottle #MOC:  BTL #Circ:   Na, girl   Jim Philemon Autry-Lott, DO  07/24/2022, 1:36 PM  GME ATTESTATION:  I saw and evaluated the patient. I agree with the findings and the plan of care as documented in the resident's note. I have made changes to documentation as necessary.  Gerlene Fee, DO OB Fellow, [place] for [place] 07/24/2022, 5:58 PM

## 2022-07-24 NOTE — Progress Notes (Addendum)
Labor Progress Note Nicole Morgan is a 26 y.o. G2P1001 at [redacted]w[redacted]d presented for IOL for oligohydramnios. HIV+ on anti retrovirals, VL 47.  S: Patient is getting more uncomfortable, frequent contractions. No LOF.   O:  BP 118/74   Pulse 100   Temp 97.8 F (36.6 C) (Oral)   Resp 14   Ht 5\' 1"  (1.549 m)   Wt 67 kg   LMP 11/06/2021   BMI 27.91 kg/m  EFM: 140 bpm/mod variability/no accels, no decels  CVE: Dilation: 4 Effacement (%): 50 Cervical Position: Posterior Station: -1 Presentation: Vertex Exam by:: Steinabch MD   A&P: 26 y.o. G2P1001 [redacted]w[redacted]d here for IOL for oligohydramnios.  #Labor: Progressing well. Intermittent tachysystole after initial cytotec dosing, making good cervical change. Consider AROM/pit at next check depending on contraction pattern. #Pain: planning epidural #FWB: category 1 #GBS negative  #HIV+: low VL, repeat pending. IV retrovir started  #Substance use: THC use during pregnancy, UDS pending #Rh neg: got prenatal rhogam, will give dose if needed postpartum  Cecilio Asper, MD [facility] [employer], [place] 9:32 PM  Attestation:  I confirm that I have verified the information documented in the resident's note and that I have also personally reperformed the physical exam and all medical decision making activities.   Seabron Spates, CNM

## 2022-07-24 NOTE — Progress Notes (Signed)
   Subjective:  Nicole Morgan is a 26 y.o. G2P1001 at [redacted]w[redacted]d being seen today for ongoing prenatal care.  She is currently monitored for the following issues for this high-risk pregnancy and has Polysubstance abuse ([place]); Bipolar 1 disorder ([place]); Cystic fibrosis carrier; Supervision of high risk pregnancy, antepartum; HIV (human immunodeficiency virus infection) ([place]); History of cervical incompetence in pregnancy, currently pregnant, second trimester; Rh negative state in antepartum period, second trimester; and Unwanted fertility on their problem list.  Patient reports occasional contractions.  Contractions: Irritability. Vag. Bleeding: None.  Movement: Present. Denies leaking of fluid.   The following portions of the patient's history were reviewed and updated as appropriate: allergies, current medications, past family history, past medical history, past social history, past surgical history and problem list. Problem list updated.  Objective:   Vitals:   07/24/22 0952  BP: 111/73  Pulse: 99  Weight: 148 lb (67.1 kg)    Fetal Status: Fetal Heart Rate (bpm): 147   Movement: Present  Presentation: Vertex  General:  Alert, oriented and cooperative. Patient is in no acute distress.  Skin: Skin is warm and dry. No rash noted.   Cardiovascular: Normal heart rate noted  Respiratory: Normal respiratory effort, no problems with respiration noted  Abdomen: Soft, gravid, appropriate for gestational age. Pain/Pressure: Present     Pelvic: Vag. Bleeding: None     Cervical exam performed Dilation: 2 Effacement (%): 70 Station: 0  Extremities: Normal range of motion.     Mental Status: Normal mood and affect. Normal behavior. Normal judgment and thought content.   Urinalysis:      Assessment and Plan:  Pregnancy: G2P1001 at [redacted]w[redacted]d  1. Supervision of high risk pregnancy, antepartum BP and FHR normal Cervix 2/70/0 but still posterior  2. Asymptomatic HIV infection, with no history of  HIV-related illness ([place]) On Descovy, Tivicay, taking meds Last VL 07/15/2022 was 61 Plan for delivery at 39 weeks, form faxed and orders placed  3. Bipolar 1 disorder [facility]) Seeing counselor at [facility] and also seeing one at [place] [facility] building services  4. Polysubstance abuse ([place]) No issues during pregnancy  5. History of cervical incompetence in pregnancy, currently pregnant, second trimester   6. Rh negative state in antepartum period, second trimester S/p rhogam x2, most recently on 06/14/2022  7. Unwanted fertility Signed BTL papers on 07/17/2022  Term labor symptoms and general obstetric precautions including but not limited to vaginal bleeding, contractions, leaking of fluid and fetal movement were reviewed in detail with the patient. Please refer to After Visit Summary for other counseling recommendations.  Return in 1 week (on 07/31/2022) for [facility], ob visit.   Clarnce Flock, MD

## 2022-07-25 ENCOUNTER — Encounter (HOSPITAL_COMMUNITY): Payer: Self-pay | Admitting: Obstetrics and Gynecology

## 2022-07-25 DIAGNOSIS — O4103X0 Oligohydramnios, third trimester, not applicable or unspecified: Secondary | ICD-10-CM

## 2022-07-25 DIAGNOSIS — O9872 Human immunodeficiency virus [HIV] disease complicating childbirth: Secondary | ICD-10-CM

## 2022-07-25 DIAGNOSIS — O4202 Full-term premature rupture of membranes, onset of labor within 24 hours of rupture: Secondary | ICD-10-CM

## 2022-07-25 DIAGNOSIS — Z3A37 37 weeks gestation of pregnancy: Secondary | ICD-10-CM

## 2022-07-25 DIAGNOSIS — O99324 Drug use complicating childbirth: Secondary | ICD-10-CM

## 2022-07-25 LAB — CBC
HCT: 31.1 % — ABNORMAL LOW (ref 36.0–46.0)
Hemoglobin: 10.5 g/dL — ABNORMAL LOW (ref 12.0–15.0)
MCH: 29.3 pg (ref 26.0–34.0)
MCHC: 33.8 g/dL (ref 30.0–36.0)
MCV: 86.9 fL (ref 80.0–100.0)
Platelets: 378 10*3/uL (ref 150–400)
RBC: 3.58 MIL/uL — ABNORMAL LOW (ref 3.87–5.11)
RDW: 14.2 % (ref 11.5–15.5)
WBC: 19.2 10*3/uL — ABNORMAL HIGH (ref 4.0–10.5)
nRBC: 0 % (ref 0.0–0.2)

## 2022-07-25 LAB — RPR: RPR Ser Ql: NONREACTIVE

## 2022-07-25 LAB — HIV-1 RNA QUANT-NO REFLEX-BLD
HIV 1 RNA Quant: <20 copies/mL
LOG10 HIV-1 RNA: UNDETERMINED log10copy/mL

## 2022-07-25 MED ORDER — SENNOSIDES-DOCUSATE SODIUM 8.6-50 MG PO TABS
2.0000 | ORAL_TABLET | Freq: Every day | ORAL | Status: DC
  Administered 2022-07-26: 2 via ORAL
  Filled 2022-07-25: qty 2

## 2022-07-25 MED ORDER — DOLUTEGRAVIR SODIUM 50 MG PO TABS
50.0000 mg | ORAL_TABLET | Freq: Every day | ORAL | Status: DC
  Filled 2022-07-25: qty 1

## 2022-07-25 MED ORDER — ONDANSETRON HCL 4 MG PO TABS: 4.0000 mg | ORAL_TABLET | ORAL | Status: DC | PRN

## 2022-07-25 MED ORDER — OXYCODONE HCL 5 MG PO TABS: 5.0000 mg | ORAL_TABLET | ORAL | Status: DC | PRN

## 2022-07-25 MED ORDER — DIPHENHYDRAMINE HCL 25 MG PO CAPS: 25.0000 mg | ORAL_CAPSULE | Freq: Four times a day (QID) | ORAL | Status: DC | PRN

## 2022-07-25 MED ORDER — EMTRICITABINE-TENOFOVIR AF 200-25 MG PO TABS
1.0000 | ORAL_TABLET | Freq: Every day | ORAL | Status: DC
  Filled 2022-07-25: qty 1

## 2022-07-25 MED ORDER — DIBUCAINE (PERIANAL) 1 % EX OINT: 1.0000 | TOPICAL_OINTMENT | CUTANEOUS | Status: DC | PRN

## 2022-07-25 MED ORDER — TETANUS-DIPHTH-ACELL PERTUSSIS 5-2.5-18.5 LF-MCG/0.5 IM SUSY: 0.5000 mL | PREFILLED_SYRINGE | Freq: Once | INTRAMUSCULAR | Status: DC

## 2022-07-25 MED ORDER — EMTRICITABINE-TENOFOVIR AF 200-25 MG PO TABS
1.0000 | ORAL_TABLET | Freq: Every day | ORAL | Status: DC
  Administered 2022-07-25 – 2022-07-26 (×2): 1 via ORAL
  Filled 2022-07-25 (×2): qty 1

## 2022-07-25 MED ORDER — PRENATAL MULTIVITAMIN CH
1.0000 | ORAL_TABLET | Freq: Every day | ORAL | Status: DC
  Administered 2022-07-25 – 2022-07-26 (×2): 1 via ORAL
  Filled 2022-07-25 (×2): qty 1

## 2022-07-25 MED ORDER — ONDANSETRON HCL 4 MG/2ML IJ SOLN: 4.0000 mg | INTRAMUSCULAR | Status: DC | PRN

## 2022-07-25 MED ORDER — CYCLOBENZAPRINE HCL 5 MG PO TABS: 10.0000 mg | ORAL_TABLET | Freq: Two times a day (BID) | ORAL | Status: DC | PRN

## 2022-07-25 MED ORDER — DOLUTEGRAVIR SODIUM 50 MG PO TABS
50.0000 mg | ORAL_TABLET | Freq: Every day | ORAL | Status: DC
  Administered 2022-07-25 – 2022-07-26 (×2): 50 mg via ORAL
  Filled 2022-07-25 (×2): qty 1

## 2022-07-25 MED ORDER — SIMETHICONE 80 MG PO CHEW: 80.0000 mg | CHEWABLE_TABLET | ORAL | Status: DC | PRN

## 2022-07-25 MED ORDER — WITCH HAZEL-GLYCERIN EX PADS: 1.0000 | MEDICATED_PAD | CUTANEOUS | Status: DC | PRN

## 2022-07-25 MED ORDER — BENZOCAINE-MENTHOL 20-0.5 % EX AERO: 1.0000 | INHALATION_SPRAY | CUTANEOUS | Status: DC | PRN

## 2022-07-25 MED ORDER — RHO D IMMUNE GLOBULIN 1500 UNIT/2ML IJ SOSY
300.0000 ug | PREFILLED_SYRINGE | Freq: Once | INTRAMUSCULAR | Status: AC
  Administered 2022-07-25: 300 ug via INTRAVENOUS
  Filled 2022-07-25: qty 2

## 2022-07-25 MED ORDER — OXYCODONE HCL 5 MG PO TABS: 10.0000 mg | ORAL_TABLET | ORAL | Status: DC | PRN

## 2022-07-25 MED ORDER — COCONUT OIL OIL: 1.0000 | TOPICAL_OIL | Status: DC | PRN

## 2022-07-25 MED ORDER — IBUPROFEN 600 MG PO TABS
600.0000 mg | ORAL_TABLET | Freq: Four times a day (QID) | ORAL | Status: DC
  Administered 2022-07-25 – 2022-07-26 (×5): 600 mg via ORAL
  Filled 2022-07-25 (×5): qty 1

## 2022-07-25 MED ORDER — ACETAMINOPHEN 325 MG PO TABS
650.0000 mg | ORAL_TABLET | ORAL | Status: DC | PRN
  Administered 2022-07-26: 650 mg via ORAL
  Filled 2022-07-25: qty 2

## 2022-07-25 MED ORDER — ZOLPIDEM TARTRATE 5 MG PO TABS: 5.0000 mg | ORAL_TABLET | Freq: Every evening | ORAL | Status: DC | PRN

## 2022-07-25 NOTE — Discharge Summary (Signed)
Postpartum Discharge Summary      Patient Name: Nicole Morgan DOB: 1996/05/27 MRN: [id number]  Date of admission: 07/24/2022 Delivery date:07/25/2022  Delivering provider: Seabron Spates  Date of discharge: 07/26/2022  Admitting diagnosis: HIV disease affecting pregnancy [O98.719] Indication for care in labor and delivery, antepartum [O75.9] Intrauterine pregnancy: [redacted]w[redacted]d    Secondary diagnosis:  Principal Problem:   HIV disease affecting pregnancy Active Problems:   Indication for care in labor and delivery, antepartum   Vaginal delivery Oligohydramnios  Additional problems: none    Discharge diagnosis: Term Pregnancy Delivered and Oligohydramnios                                               Post partum procedures:rhogam and MMR Augmentation: Cytotec Complications: None  Hospital course: Induction of Labor With Vaginal Delivery   26y.o. yo G2P1001 at 340w2das admitted to the hospital 07/24/2022 for induction of labor.  Indication for induction:  Oligohydramnios .  Patient had an labor course complicated by rapid progress in labor. Membrane Rupture Time/Date:  ,   Delivery Method:Vaginal, Spontaneous  Episiotomy: None  Lacerations:  None  Details of delivery can be found in separate delivery note.  Patient had a postpartum course complicated by nothing. Patient is discharged home 07/26/22.  Newborn Data: Birth date:07/25/2022  Birth time:2:13 AM  Gender:Female  Living status:Living  Apgars:8 ,9  Weight:2750 g   Magnesium Sulfate received: No BMZ received: No Rhophylac:Yes MMR:No T-DaP:Given prenatally Flu: N/A Transfusion:No  Physical exam  Vitals:   07/25/22 0848 07/25/22 1120 07/25/22 2138 07/26/22 0249  BP: 105/73 115/74 107/77 106/70  Pulse: (!) 104 87 81 65  Resp: _0 Temp: 98.1 F (36.7 C) 98.7 F (37.1 C) 98.1 F (36.7 C) 97.8 F (36.6 C)  TempSrc: Oral Oral Axillary Oral  SpO2: 97% 99% 100%   Weight:      Height:        General: alert, cooperative, and no distress Lochia: appropriate Uterine Fundus: firm Incision: N/A DVT Evaluation: No evidence of DVT seen on physical exam. Negative Homan's sign. No cords or calf tenderness. Labs: Lab Results  Component Value Date   WBC 19.2 (H) 07/25/2022   HGB 10.5 (L) 07/25/2022   HCT 31.1 (L) 07/25/2022   MCV 86.9 07/25/2022   PLT 378 07/25/2022      Latest Ref Rng & Units 04/01/2022    5:57 PM  CMP  Glucose 70 - 99 mg/dL 91   BUN 6 - 20 mg/dL <5   Creatinine 0.44 - 1.00 mg/dL 0.54   Sodium 135 - 145 mmol/L 136   Potassium 3.5 - 5.1 mmol/L 3.7   Chloride 98 - 111 mmol/L 103   CO2 22 - 32 mmol/L 22   Calcium 8.9 - 10.3 mg/dL 8.7    Edinburgh Score:    07/25/2022    9:24 AM  Edinburgh Postnatal Depression Scale Screening Tool  I have been able to laugh and see the funny side of things. 0  I have looked forward with enjoyment to things. 0  I have blamed myself unnecessarily when things went wrong. 2  I have been anxious or worried for no good reason. 2  I have felt scared or panicky for no good reason. 2  Things have been getting on top of me. 2  I have been so unhappy that I have had difficulty sleeping. 1  I have felt sad or miserable. 1  I have been so unhappy that I have been crying. 1  The thought of harming myself has occurred to me. 1  Edinburgh Postnatal Depression Scale Total 12      After visit meds:  Allergies as of 07/26/2022   No Known Allergies      Medication List     STOP taking these medications    acetaminophen 325 MG tablet Commonly known as: Tylenol   calcium carbonate 500 MG chewable tablet Commonly known as: TUMS - dosed in mg elemental calcium   cyclobenzaprine 10 MG tablet Commonly known as: FLEXERIL   emtricitabine-tenofovir AF 200-25 MG tablet Commonly known as: DESCOVY   PRENATAL PO   Tivicay 50 MG tablet Generic drug: dolutegravir       TAKE these medications    ibuprofen 600 MG  tablet Commonly known as: ADVIL Take 1 tablet (600 mg total) by mouth every 6 (six) hours.         Discharge home in stable condition Infant Feeding: Bottle Infant Disposition:home with mother Discharge instruction: per After Visit Summary and Postpartum booklet. Activity: Advance as tolerated. Pelvic rest for 6 weeks.  Diet: routine diet Anticipated Birth Control: Interval BTL Postpartum Appointment:4 weeks Additional Postpartum F/U:  plans outpt BTL, needs preop Future Appointments: Future Appointments  Date Time Provider [place]  08/06/2022  9:30 AM Hadassah Pais, LCSW [facility]  08/13/2022  9:30 AM Hadassah Pais, LCSW [facility]  09/04/2022 10:35 AM Clarnce Flock, MD [facility]  11/04/2022  8:45 AM [place] Callas, NP [facility]   Follow up Visit:      07/26/2022 Christin Fudge, CNM

## 2022-07-26 LAB — SURGICAL PATHOLOGY

## 2022-07-26 LAB — RH IG WORKUP (INCLUDES ABO/RH)
Gestational Age(Wks): 37
Unit division: 0

## 2022-07-26 MED ORDER — MEASLES, MUMPS & RUBELLA VAC IJ SOLR
0.5000 mL | Freq: Once | INTRAMUSCULAR | Status: AC
  Administered 2022-07-26: 0.5 mL via SUBCUTANEOUS

## 2022-07-26 MED ORDER — IBUPROFEN 600 MG PO TABS: 600.0000 mg | ORAL_TABLET | Freq: Four times a day (QID) | ORAL | 0 refills | Status: DC

## 2022-07-26 NOTE — Anesthesia Postprocedure Evaluation (Signed)
Anesthesia Post Note  Patient: Nicole Morgan  Procedure(s) Performed: AN AD HOC LABOR EPIDURAL     Patient location during evaluation: Mother Baby Anesthesia Type: Epidural Level of consciousness: awake and alert, awake and oriented Pain management: pain level controlled Vital Signs Assessment: post-procedure vital signs reviewed and stable Respiratory status: spontaneous breathing and nonlabored ventilation Cardiovascular status: blood pressure returned to baseline and stable Postop Assessment: no headache, no backache, able to ambulate and adequate PO intake Anesthetic complications: no  No notable events documented.  Last Vitals: There were no vitals filed for this visit.  Last Pain: There were no vitals filed for this visit. Pain Goal:                   Marchello Rothgeb L Carleah Yablonski
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
## 2022-07-29 ENCOUNTER — Encounter: Payer: Self-pay | Admitting: General Practice

## 2022-07-29 ENCOUNTER — Telehealth (HOSPITAL_COMMUNITY): Payer: Self-pay | Admitting: *Deleted

## 2022-07-29 DIAGNOSIS — Z1331 Encounter for screening for depression: Secondary | ICD-10-CM

## 2022-07-29 NOTE — Telephone Encounter (Signed)
Inpatient EPDS of 12 with positive response to #10. CSW consult completed. Ambulatory IBH referral made now and Dr. Dione Plover notified via chart.  Odis Hollingshead, RN 07-29-2022 at 10:53am

## 2022-07-31 ENCOUNTER — Ambulatory Visit: Payer: MEDICAID

## 2022-07-31 ENCOUNTER — Encounter: Payer: Self-pay | Admitting: Family Medicine

## 2022-08-01 NOTE — BH Specialist Note (Unsigned)
Integrated Behavioral Health via Telemedicine Visit  08/07/2022 Nicole Morgan [id number]  Number of Integrated Behavioral Health Clinician visits: 1- Initial Visit  Session Start time: 1354   Session End time: 1434  Total time in minutes: 40   Referring Provider: Merian Capron, MD Patient/Family location: Home*** [facility] Provider location: [facility]  All persons participating in visit: Patient Nicole Morgan and Nicole Morgan ***  Types of Service: {CHL AMB TYPE OF SERVICE:[phone]}  I connected with Nicole Morgan and/or Nicole Morgan's {family members:20773} via  Telephone or Engineer, civil (consulting)  (Video is Surveyor, mining) and verified that I am speaking with the correct person using two identifiers. Discussed confidentiality: Yes   I discussed the limitations of telemedicine and the availability of in person appointments.  Discussed there is a possibility of technology failure and discussed alternative modes of communication if that failure occurs.  I discussed that engaging in this telemedicine visit, they consent to the provision of behavioral healthcare and the services will be billed under their insurance.  Patient and/or legal guardian expressed understanding and consented to Telemedicine visit: Yes   Presenting Concerns: Patient and/or family reports the following symptoms/concerns: *** Duration of problem: ***; Severity of problem: {Mild/Moderate/Severe:20260}  Patient and/or Family's Strengths/Protective Factors: {CHL AMB BH PROTECTIVE FACTORS:[phone]}  Goals Addressed: Patient will:  Reduce symptoms of: {IBH Symptoms:21014056}   Increase knowledge and/or ability of: {IBH Patient Tools:21014057}   Demonstrate ability to: {IBH Goals:21014053}  Progress towards Goals: {CHL AMB BH PROGRESS TOWARDS [phone]}  Interventions: Interventions utilized:  {IBH  Interventions:21014054} Standardized Assessments completed: {IBH Screening Tools:21014051}  Patient and/or Family Response: Patient agrees with treatment plan. ***  Assessment: Patient currently experiencing ***.   Patient may benefit from psychoeducation and brief therapeutic interventions regarding coping with symptoms of *** .  Plan: Follow up with behavioral health clinician on : *** Behavioral recommendations:  -*** -*** Referral(s): {IBH Referrals:21014055}  I discussed the assessment and treatment plan with the patient and/or parent/guardian. They were provided an opportunity to ask questions and all were answered. They agreed with the plan and demonstrated an understanding of the instructions.   They were advised to call back or seek an in-person evaluation if the symptoms worsen or if the condition fails to improve as anticipated.  Rae Lips, LCSW     07/17/2022    9:07 AM 06/26/2022    5:16 PM 05/14/2022    9:27 AM 05/01/2022    2:46 PM  Depression screen PHQ 2/9  Decreased Interest 1 0 2 0  Down, Depressed, Hopeless 0 1 2 1   PHQ - 2 Score 1 1 4 1   Altered sleeping 2 2 1    Tired, decreased energy 3 2 1    Change in appetite 1 1 0   Feeling bad or failure about yourself  0 0 3   Trouble concentrating 0 0 1   Moving slowly or fidgety/restless 0 0 1   Suicidal thoughts 0 0 1   PHQ-9 Score 7 6 12        07/17/2022    9:07 AM 06/26/2022    5:16 PM 05/14/2022    9:27 AM  GAD 7 : Generalized Anxiety Score  Nervous, Anxious, on Edge 3 2 3   Control/stop worrying 1 1 3   Worry too much - different things 1 1 3   Trouble relaxing 2 1 3   Restless 1 0 1  Easily annoyed or irritable 1 0  2  Afraid - awful might happen 2 2 3   Total GAD 7 Score 11 7 18

## 2022-08-06 ENCOUNTER — Telehealth (HOSPITAL_COMMUNITY): Payer: Self-pay | Admitting: *Deleted

## 2022-08-06 ENCOUNTER — Other Ambulatory Visit: Payer: Self-pay

## 2022-08-06 ENCOUNTER — Inpatient Hospital Stay (HOSPITAL_COMMUNITY): Admission: RE | Admit: 2022-08-06 | Payer: Medicaid Other | Source: Ambulatory Visit

## 2022-08-06 ENCOUNTER — Ambulatory Visit: Payer: Medicaid Other

## 2022-08-06 NOTE — Telephone Encounter (Signed)
Mom reports feeling good. No concerns about herself at this time. Has appt tomorrow with therapist. EPDS=7 [facility] score=12) Mom reports baby is doing well. Feeding, peeing, and pooping without difficulty. Safe sleep reviewed. Mom reports no concerns about baby at present.  Duffy Rhody, RN 08-06-2022 at 3:36pm

## 2022-08-07 ENCOUNTER — Ambulatory Visit (INDEPENDENT_AMBULATORY_CARE_PROVIDER_SITE_OTHER): Payer: Medicaid Other | Admitting: Clinical

## 2022-08-07 ENCOUNTER — Encounter: Payer: Self-pay | Admitting: Family Medicine

## 2022-08-07 DIAGNOSIS — F4310 Post-traumatic stress disorder, unspecified: Secondary | ICD-10-CM

## 2022-08-07 DIAGNOSIS — F319 Bipolar disorder, unspecified: Secondary | ICD-10-CM

## 2022-08-07 DIAGNOSIS — Z658 Other specified problems related to psychosocial circumstances: Secondary | ICD-10-CM

## 2022-08-08 NOTE — Patient Instructions (Signed)
Center for [facility] at [facility] [place], [place] [address] [phone] (main office) [phone] [facility] office)  New Parent Support Groups www.postpartum.net www.conehealthybaby.com   LIEAP (Low Income Futures trader) ReportZoo.com.cy  [employer] ([place]) http://cameron-bishop.biz/  Housing Resources                    [place] (serves [place], [place], [place], [place], [place], [place], [place], [place], [place], [place], [place], [place], and [place] counties) [address] [place], [place] [address] [phone] http://dawson-may.com/  **Rental assistance, Home Rehabilitation,Weatherization Assistance Program, Forensic psychologist, Housing Voucher Program  [employer] for Housing and Commercial Metals Company Studies: Chief Financial Officer Resources to residents of [place], [place], and [place] Make sure you have your documents ready, including:  (Household income verification: 2 months pay stubs, unemployment/social security award letter, statement of no income for all household members over 64) Photo ID for all household members over 18 Utility Bill/Rent Ledger/Lease: must show past due amount for utilities/rent, or the rental agreement if rent is current 2. Start your application online or by paper (in [place] or [place]) at:     http://boyd-evans.org/  3. Once you have completed the online application, you will get an email confirmation message from the county. Expect to hear back by phone or email at least 6-10 weeks from submitting your application.  4. While you wait:  Call [phone]  to check in on your application Let your landlord know that you've applied. Your landlord will be asked to submit documents (W-9) during this application process. Payments will be made directly to the landlord/property [place] or utility assistance for [employer], [place], and [place] at https://rb.gy/[email or web address] Questions? Call or email Renee at [phone] or [email or web address]   Eviction Mediation Program: The HOPE Program Https://www.rebuild.http://mills-williams.net/ HOPE Progam serves low-income renters in [place] counties, defined as less than or equal to 80% of the area median income for the county where the renter lives. In the following 12 counties, you should apply to your local rent and utility assistance program INSTEAD OF the HOPE Program: [place], [place], [place], [place], [place], [place], [place], [place], [place], [place], [place]  If you live outside of [place], contact [place] call center at [phone] to talk to a Program Representative Monday-Friday, 8am-5pm Note that Native American tribes also received federal funding for rent and utility assistance programs. Recognized members of the following tribes will be served by programs managed by tribal governments, including: [place] Indians, [place], [place], [place] of [place] and [place], [place], [phone] [email or web address] .[employer], [place], [phone] [email or web address]   Housing Resources [place] [address] [place], [place] [address] [phone] www.gha-[place].[facility] [address] [place], [place] [address] [phone] https://manning.com/ **Programs include: Engineer, building services and Housing Counseling, Healthy Doctor, general practice,  Homeless Prevention and [place] [address], [place], [place], [place] [address] [phone] www.https://www.farmer-stevens.info/ **housing applications/recertification; tax payment relief/exemption under specific qualifications  [facility] [address] [place], [place] [address] www.onlinegreensboro.com/~maryshouse **transitional housing for women in recovery who have minor children or are pregnant  [place], [place], [place] [address] ArtistMovie.se  **emergency shelter and support services for families facing homelessness  [facility] [address], [place], [place] [address] [phone] www.youthfocus.org **transitional housing to pregnant women; emergency housing for youth who have run away, are experiencing a family crisis, are victims of abuse or neglect, or are homeless  [facility] [address] [place], [place] [address] [phone] ircgso.org **Drop-in center for people experiencing homelessness; overnight warming center when temperature is 25 degrees or below  Re-Entry Staffing [address] [place], [place], [place] [address] [phone] https://reentrystaffingagency.org/ **help with affordable housing to people experiencing homelessness or unemployment due to incarceration  [facility] [address], [place], [place] [address] [phone] www.greensborourbanministry.org  **emergency and transitional housing, rent/mortgage assistance, utility assistance  [facility] [address], [place], [place] [address] [phone] www.salvationarmyofgreensboro.org **emergency and transitional housing  Habitat for [employer] [address], [place], [place] [address] [phone] Www.habitatgreensboro.[facility]   [employer] [address], [place], [place]  [address] [phone] https://chshousing.org **Home Ownership/Affordable Housing Program and [facility]  Housing Consultants Group [address], [place], [place] [address] [phone] PaidValue.com.cy **home buyer education courses, foreclosure prevention  [facility] [address] [place], [place] [address] [phone] DefMagazine.is **Environmental Exposure Assessment (investigation of homes where either children or pregnant women with a confirmed elevated blood lead level reside)  [facility] [address] [place], [place] [address] [phone] ShowReturn.ca **Home Expense Assistance/Repairs Program; offers home accessibility updates, such as ramps or bars in the bathroom  Self-Help Credit [facility] [address] [place], [place] [address] [phone] https://www.self-help.org/locations/[place]-branch **Offers credit-building and banking services to people unable to use traditional banking   Housing Resources [facility]  Housing Authority- [place] [facility] [address] [place], [place] [address] [phone] ChatRepair.pl   [facility] [address], [place], [place] [address] [phone]  WrestlingMonthly.pl **housing applications/recertification, emergency and transitional housing  Open [employer] of [employer] [address], [place], [place] [address] [phone] www.odm-hp.org  **emergency and permanent housing; rent/mortgage payment assistance  Habitat for [employer], [place] and [place] [address] [place], [place] [address] [phone] https://russell-walls.com/  Family Services of the [place], [employer] [address] [place], [place], [place]  [address] www.familyservice-piedmont.org **emergency shelter for victims of domestic violence and sexual assault  [facility] [address] [place], [place] [address] [phone] www.senior-resources-guilford.org **Home expense assistance/repairs for older adults  Housing Resources [facility] [place] [address] [place], [place] [address] [phone] http://cohen-reilly.biz/  **May offer help with minor housing repairs  [facility] [address] [place], [place] [address] [phone] **emergency housing for victims of domestic violence  Housing Resources [place], [place], [place] [facility])  [facility] [address] [place], [place] [address] [phone] www.newrha.[facility] [address] [place], [place] [address] [phone]  [facility] [address], [place], [place] [address] [phone] www.co.[place].us **Housing applications/recertification; tax payment relief/exemption w specific qualifications  [facility] Help for Homeless [address] [place], [place] [address] [phone] Http://www.rchelpforhomeless.org/HOME_PAGE.html  HELP, [facility] [address], [place], [place] [address] [phone] 24 Hour Crisis Line [phone] Hours of Operation: Monday-Friday, 8:30am-5:00pm http://helpincorporated.org **Includes emergency housing for victims of domestic violence

## 2022-08-08 NOTE — BH Specialist Note (Unsigned)
Integrated Behavioral Health via Telemedicine Visit  08/22/2022 Nicole Morgan [id number]  Number of Integrated Behavioral Health Clinician visits: 2- Second Visit  Session Start time: [phone]   Session End time: 1019  Total time in minutes: 21   Referring Provider: Merian Capron, MD Nicole/Family location: Home [facility] Provider location: [facility]  All persons participating in visit: Nicole Morgan and [facility] Kazi Reppond   Types of Service: Individual psychotherapy and Video visit  I connected with Nicole Morgan and/or Nicole Morgan's  n/a  via  Telephone or Engineer, civil (consulting)  (Video is Caregility application) and verified that I am speaking with the correct person using two identifiers. Discussed confidentiality: Yes   I discussed the limitations of telemedicine and the availability of in person appointments.  Discussed there is a possibility of technology failure and discussed alternative modes of communication if that failure occurs.  I discussed that engaging in this telemedicine visit, they consent to the provision of behavioral healthcare and the services will be billed under their insurance.  Nicole and/or legal guardian expressed understanding and consented to Telemedicine visit: Yes   Presenting Concerns: Nicole and/or family reports the following symptoms/concerns: Stress as hasn't heard from psychiatry for initial appointment; overall, symptoms of anxiety and depression are decreasing with positive health of baby, family support, upcoming move into new home with dad for support.  Duration of problem: Postpartum; Severity of problem: mild  Nicole and/or Family's Strengths/Protective Factors: Social connections, Social and Emotional competence, Concrete supports in place (healthy food, safe environments, etc.), Sense of purpose, and Physical Health (exercise, healthy diet, medication  compliance, etc.)  Goals Addressed: Nicole will:  Maintain reduction of symptoms of: anxiety and depression    Demonstrate ability to: Increase healthy adjustment to current life circumstances  Progress towards Goals: Ongoing  Interventions: Interventions utilized:  Supportive Reflection Standardized Assessments completed: GAD-7 and PHQ 9  Nicole and/or Family Response: Nicole agrees with treatment plan.   Assessment: Nicole currently experiencing Bipolar 1 disorder; PTSD (both as previously diagnosed by psychiatry).   Nicole may benefit from continued therapeutic intervention.  Plan: Follow up with behavioral health clinician on : Two weeks Behavioral recommendations:  --Continue prioritizing healthy self-care (regular meals, adequate rest; allowing practical help from supportive friends and family) until at least postpartum medical appointment -Consider new mom support group as needed at either www.postpartum.net or www.conehealthybaby.com  -Continue accepting referral to psychiatry for [facility] medication management -Continue plan to move into new housing with father for extra support  Referral(s): Integrated Art gallery manager (In Clinic) and [employer] [facility] (LME/Outside Clinic)  I discussed the assessment and treatment plan with the Nicole and/or parent/guardian. They were provided an opportunity to ask questions and all were answered. They agreed with the plan and demonstrated an understanding of the instructions.   They were advised to call back or seek an in-person evaluation if the symptoms worsen or if the condition fails to improve as anticipated.  Rae Lips, LCSW     08/21/2022   10:10 AM 07/17/2022    9:07 AM 06/26/2022    5:16 PM 05/14/2022    9:27 AM 05/01/2022    2:46 PM  Depression screen PHQ 2/9  Decreased Interest 0 1 0 2 0  Down, Depressed, Hopeless 0 0 1 2 1   PHQ - 2 Score 0 1 1 4 1   Altered sleeping 0 2 2 1    Tired,  decreased energy 0 3 2 1    Change in appetite 0 1 1 0   Feeling bad or failure about yourself  0 0 0 3   Trouble concentrating 0 0 0 1   Moving slowly or fidgety/restless 0 0 0 1   Suicidal thoughts 0 0 0 1   PHQ-9 Score 0 7 6 12        08/21/2022   10:11 AM 07/17/2022    9:07 AM 06/26/2022    5:16 PM 05/14/2022    9:27 AM  GAD 7 : Generalized Anxiety Score  Nervous, Anxious, on Edge 0 3 2 3   Control/stop worrying 0 1 1 3   Worry too much - different things 0 1 1 3   Trouble relaxing 0 2 1 3   Restless 0 1 0 1  Easily annoyed or irritable 1 1 0 2  Afraid - awful might happen 0 2 2 3   Total GAD 7 Score 1 11 7  18

## 2022-08-13 ENCOUNTER — Ambulatory Visit: Payer: Medicaid Other

## 2022-08-13 ENCOUNTER — Encounter: Payer: Self-pay | Admitting: Family Medicine

## 2022-08-13 ENCOUNTER — Encounter (HOSPITAL_COMMUNITY): Payer: Self-pay

## 2022-08-14 ENCOUNTER — Other Ambulatory Visit: Payer: Self-pay

## 2022-08-14 ENCOUNTER — Encounter: Payer: Self-pay | Admitting: Family Medicine

## 2022-08-18 ENCOUNTER — Encounter (HOSPITAL_COMMUNITY): Payer: Self-pay | Admitting: Emergency Medicine

## 2022-08-18 ENCOUNTER — Ambulatory Visit (HOSPITAL_COMMUNITY)
Admission: EM | Admit: 2022-08-18 | Discharge: 2022-08-18 | Disposition: A | Discharge status: Home / Self Care | Payer: Medicaid Other | Attending: Emergency Medicine | Admitting: Emergency Medicine

## 2022-08-18 DIAGNOSIS — B86 Scabies: Secondary | ICD-10-CM | POA: Diagnosis not present

## 2022-08-18 MED ORDER — PERMETHRIN 5 % EX CREA: TOPICAL_CREAM | CUTANEOUS | 0 refills | Status: DC

## 2022-08-18 NOTE — ED Provider Notes (Signed)
[facility]    CSN: [id number] Arrival date & time: 08/18/22  1329      History   Chief Complaint Chief Complaint  Patient presents with   Rash    HPI Nicole Morgan is a 26 y.o. female. She reports staying in a hotel and then a few days later developed an itchy rash she believes is scabies. States she tried [employer] with no relief and was advised to try lice shampoo by pharmacist and it was a little effective     Rash   Past Medical History:  Diagnosis Date   Anxiety 2020   Bipolar 1 disorder (HCC) 2020   Cystic fibrosis carrier    Depression    no meds   Eczema    HIV (human immunodeficiency virus infection) (HCC) 04/2022   Incompetent cervix    Insomnia 2020   PTSD (post-traumatic stress disorder) 2020   UTI (urinary tract infection)     Patient Active Problem List   Diagnosis Date Noted   Vaginal delivery 07/25/2022   HIV disease affecting pregnancy 07/24/2022   Indication for care in labor and delivery, antepartum 07/24/2022   Unwanted fertility 06/26/2022   History of cervical incompetence in pregnancy, currently pregnant, second trimester 05/14/2022   Rh negative state in antepartum period, second trimester 05/14/2022   HIV (human immunodeficiency virus infection) (HCC) 05/01/2022   Cystic fibrosis carrier 04/09/2022   Supervision of high risk pregnancy, antepartum 04/09/2022   Polysubstance abuse (HCC) 04/08/2021   Bipolar 1 disorder (HCC) 04/08/2021    Past Surgical History:  Procedure Laterality Date   CERVICAL CERCLAGE  06/06/2012   Procedure: CERCLAGE CERVICAL;  Surgeon: Bing Plume, MD;  Location: [facility];  Service: Gynecology;  Laterality: N/A;   PLACEMENT OF BREAST IMPLANTS     WISDOM TOOTH EXTRACTION      OB History     Gravida  2   Para  2   Term  2   Preterm  0   AB  0   Living  2      SAB  0   IAB  0   Ectopic  0   Multiple  0   Live Births  2            Home Medications     Prior to Admission medications   Medication Sig Start Date End Date Taking? Authorizing Provider  permethrin (ELIMITE) 5 % cream Apply from neck down, leave on for 8-14 hours then wash off. 08/18/22  Yes Balen Woolum, Marzella Schlein, NP  ibuprofen (ADVIL) 600 MG tablet Take 1 tablet (600 mg total) by mouth every 6 (six) hours. 07/26/22   Cresenzo-Dishmon, Scarlette Calico, CNM    Family History Family History  Problem Relation Age of Onset   Bipolar disorder Mother        schizophrenic   Mental illness Mother        schizophrenia   Deep vein thrombosis Maternal Aunt    Alcohol abuse Maternal Grandmother    Mental illness Maternal Grandmother    Bipolar disorder Maternal Grandmother        schizophrenic    Cancer Maternal Grandmother        lymph nodes   Migraines Neg Hx    Headache Neg Hx     Social History Social History   Tobacco Use   Smoking status: Every Day    Types: E-cigarettes    Last attempt to quit: 04/2022    Years  since quitting: 0.3   Smokeless tobacco: Never  Vaping Use   Vaping Use: Every day  Substance Use Topics   Alcohol use: Not Currently    Comment: 1-2 beers every now and then   Drug use: Yes    Types: Marijuana    Comment: last smoked 05/24/2022     Allergies   Patient has no known allergies.   Review of Systems Review of Systems  Skin:  Positive for rash.     Physical Exam Triage Vital Signs ED Triage Vitals  Enc Vitals Group     BP 08/18/22 1457 116/80     Pulse Rate 08/18/22 1457 74     Resp 08/18/22 1457 16     Temp 08/18/22 1457 98 F (36.7 C)     Temp Source 08/18/22 1457 Oral     SpO2 08/18/22 1457 100 %     Weight --      Height --      Head Circumference --      Peak Flow --      Pain Score 08/18/22 1454 0     Pain Loc --      Pain Edu? --      Excl. in GC? --    No data found.  Updated Vital Signs BP 116/80 (BP Location: Left Arm)   Pulse 74   Temp 98 F (36.7 C) (Oral)   Resp 16   LMP 11/06/2021   SpO2 100%   Visual  Acuity Right Eye Distance:   Left Eye Distance:   Bilateral Distance:    Right Eye Near:   Left Eye Near:    Bilateral Near:     Physical Exam Constitutional:      Appearance: Normal appearance. She is not ill-appearing.  Pulmonary:     Effort: Pulmonary effort is normal.  Skin:    General: Skin is warm and dry.     Comments: Red excoriated lesions in linear tracks on hands, abd, neck.   Neurological:     Mental Status: She is alert.      UC Treatments / Results  Labs (all labs ordered are listed, but only abnormal results are displayed) Labs Reviewed - No data to display  EKG   Radiology No results found.  Procedures Procedures (including critical care time)  Medications Ordered in UC Medications - No data to display  Initial Impression / Assessment and Plan / UC Course  I have reviewed the triage vital signs and the nursing notes.  Pertinent labs & imaging results that were available during my care of the patient were reviewed by me and considered in my medical decision making (see chart for details).    Based on appearance, may be scabies. Reviewed home and clothing care.   Final Clinical Impressions(s) / UC Diagnoses   Final diagnoses:  Scabies   Discharge Instructions   None    ED Prescriptions     Medication Sig Dispense Auth. Provider   permethrin (ELIMITE) 5 % cream Apply from neck down, leave on for 8-14 hours then wash off. 60 g Doloris Servantes, Marzella Schlein, NP      PDMP not reviewed this encounter.   Cathlyn Parsons, NP 08/18/22 1540

## 2022-08-18 NOTE — ED Triage Notes (Signed)
Pt reports a rash all over x 5 days. States she believes it may be scabies.Reports a recent stay at a hotel  States she tried [employer] Benadryl with no relief and was advised to try lice shampoo and it was effective.

## 2022-08-20 ENCOUNTER — Telehealth: Payer: Self-pay | Admitting: Family Medicine
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
## 2022-08-20 NOTE — Telephone Encounter (Signed)
Message sent to [facility] in regards to scheduling BTL

## 2022-08-20 NOTE — Telephone Encounter (Signed)
Called pt in regards to her question about scheduling her BTL surgery. Informed pt that surgery would be scheduled at [facility] visit. Scheduled for PP visit 12/13 with Hazle Coca MD. Pt signed BTL form 07/17/22. Pt verbalized understanding and denied further questions.

## 2022-08-20 NOTE — Telephone Encounter (Signed)
Patient is inquiring when will her surgery be schedule

## 2022-08-21 ENCOUNTER — Ambulatory Visit (INDEPENDENT_AMBULATORY_CARE_PROVIDER_SITE_OTHER): Payer: Medicaid Other | Admitting: Clinical

## 2022-08-21 ENCOUNTER — Encounter: Payer: Self-pay | Admitting: Family Medicine

## 2022-08-21 DIAGNOSIS — F319 Bipolar disorder, unspecified: Secondary | ICD-10-CM | POA: Diagnosis not present

## 2022-08-21 DIAGNOSIS — Z658 Other specified problems related to psychosocial circumstances: Secondary | ICD-10-CM

## 2022-08-21 DIAGNOSIS — F4310 Post-traumatic stress disorder, unspecified: Secondary | ICD-10-CM

## 2022-08-26 NOTE — BH Specialist Note (Signed)
Integrated Behavioral Health via Telemedicine Visit  09/04/2022 Nicole Morgan [id number]  Number of Integrated Behavioral Health Clinician visits: 3- Third Visit  Session Start time: 0819   Session End time: 0829  Total time in minutes: 10   Referring Provider: Merian Capron, MD Patient/Family location: [facility] Provider location: [facility]  All persons participating in visit: Patient Nicole Morgan and [facility] Nicole Morgan   Types of Service: Individual psychotherapy and Telephone visit  I connected with Nicole Morgan and/or Nicole Morgan's  n/a  via  Telephone or Engineer, civil (consulting)  (Video is Surveyor, mining) and verified that I am speaking with the correct person using two identifiers. Discussed confidentiality: Yes   I discussed the limitations of telemedicine and the availability of in person appointments.  Discussed there is a possibility of technology failure and discussed alternative modes of communication if that failure occurs.  I discussed that engaging in this telemedicine visit, they consent to the provision of behavioral healthcare and the services will be billed under their insurance.  Patient and/or legal guardian expressed understanding and consented to Telemedicine visit: Yes   Presenting Concerns: Patient and/or family reports the following symptoms/concerns: Requesting to start back on Abilify; no other concerns at this time.  Duration of problem: Postpartum; Severity of problem: mild  Patient and/or Family's Strengths/Protective Factors: Social connections, Social and Emotional competence, Concrete supports in place (healthy food, safe environments, etc.), Sense of purpose, and Physical Health (exercise, healthy diet, medication compliance, etc.)  Goals Addressed: Patient will:  Maintain reduction of  symptoms of: mood instability    Progress towards  Goals: Ongoing  Interventions: Interventions utilized:  Solution-Focused Strategies Standardized Assessments completed: Not Needed  Patient and/or Family Response: Patient agrees with treatment plan.   Assessment: Patient currently experiencing Bipolar 1 disorder, PTSD (both as previously diagnosed via psychiatry).   Patient may benefit from brief therapeutic intervention today.  Plan: Follow up with behavioral health clinician on : Call Tuesday Terlecki at [phone], as needed. Behavioral recommendations:  -Continue to accept referral to psychiatry for [facility] medication management  -Continue prioritizing healthy self care daily Referral(s): Community Mental Health Services (LME/Outside Clinic)  I discussed the assessment and treatment plan with the patient and/or parent/guardian. They were provided an opportunity to ask questions and all were answered. They agreed with the plan and demonstrated an understanding of the instructions.   They were advised to call back or seek an in-person evaluation if the symptoms worsen or if the condition fails to improve as anticipated.  Rae Lips, LCSW     08/21/2022   10:10 AM 07/17/2022    9:07 AM 06/26/2022    5:16 PM 05/14/2022    9:27 AM 05/01/2022    2:46 PM  Depression screen PHQ 2/9  Decreased Interest 0 1 0 2 0  Down, Depressed, Hopeless 0 0 1 2 1   PHQ - 2 Score 0 1 1 4 1   Altered sleeping 0 2 2 1    Tired, decreased energy 0 3 2 1    Change in appetite 0 1 1 0   Feeling bad or failure about yourself  0 0 0 3   Trouble concentrating 0 0 0 1   Moving slowly or fidgety/restless 0 0 0 1   Suicidal thoughts 0 0 0 1   PHQ-9 Score 0 7 6 12        08/21/2022   10:11 AM 07/17/2022  9:07 AM 06/26/2022    5:16 PM 05/14/2022    9:27 AM  GAD 7 : Generalized Anxiety Score  Nervous, Anxious, on Edge 0 3 2 3   Control/stop worrying 0 1 1 3   Worry too much - different things 0 1 1 3   Trouble relaxing 0 2 1 3   Restless 0 1 0 1  Easily annoyed  or irritable 1 1 0 2  Afraid - awful might happen 0 2 2 3   Total GAD 7 Score 1 11 7  18

## 2022-09-03 ENCOUNTER — Ambulatory Visit: Payer: Medicaid Other

## 2022-09-04 ENCOUNTER — Other Ambulatory Visit: Payer: Self-pay

## 2022-09-04 ENCOUNTER — Ambulatory Visit (INDEPENDENT_AMBULATORY_CARE_PROVIDER_SITE_OTHER): Payer: Medicaid Other | Admitting: Clinical

## 2022-09-04 ENCOUNTER — Ambulatory Visit: Payer: Medicaid Other | Admitting: Family Medicine

## 2022-09-04 ENCOUNTER — Telehealth (HOSPITAL_COMMUNITY): Payer: Self-pay

## 2022-09-04 ENCOUNTER — Encounter: Payer: Self-pay | Admitting: Obstetrics and Gynecology

## 2022-09-04 ENCOUNTER — Ambulatory Visit (INDEPENDENT_AMBULATORY_CARE_PROVIDER_SITE_OTHER): Payer: Medicaid Other | Admitting: Obstetrics and Gynecology

## 2022-09-04 VITALS — BP 112/67 | HR 97 | Ht 61.5 in | Wt 144.4 lb

## 2022-09-04 DIAGNOSIS — F319 Bipolar disorder, unspecified: Secondary | ICD-10-CM | POA: Diagnosis not present

## 2022-09-04 DIAGNOSIS — Z21 Asymptomatic human immunodeficiency virus [HIV] infection status: Secondary | ICD-10-CM

## 2022-09-04 DIAGNOSIS — F4310 Post-traumatic stress disorder, unspecified: Secondary | ICD-10-CM

## 2022-09-04 NOTE — Telephone Encounter (Signed)
Called pt to schedule appt with Dr Adrian Blackwater per his request no answer left vm to call back

## 2022-09-04 NOTE — Progress Notes (Signed)
Post Partum Visit Note  Nicole Morgan is a 26 y.o. [id number] female who presents for a postpartum visit. She is 5 weeks postpartum following a normal spontaneous vaginal delivery.  I have fully reviewed the prenatal and intrapartum course. The delivery was at [redacted]w[redacted]d gestational weeks.  Anesthesia: epidural. Postpartum course has been uncomplicated. Baby is doing well. Baby is feeding by bottle - Similac Sensitive RS. Bleeding staining only. Bowel function is normal. Bladder function is normal. Patient is not sexually active. Contraception method is none. Postpartum depression screening: negative.   The pregnancy intention screening data noted above was reviewed. Potential methods of contraception were discussed. The patient elected to proceed with No data recorded.   Edinburgh Postnatal Depression Scale - 09/04/22 1049       Edinburgh Postnatal Depression Scale:  In the Past 7 Days   I have been able to laugh and see the funny side of things. 0    I have looked forward with enjoyment to things. 0    I have blamed myself unnecessarily when things went wrong. 0    I have been anxious or worried for no good reason. 0    I have felt scared or panicky for no good reason. 0    Things have been getting on top of me. 0    I have been so unhappy that I have had difficulty sleeping. 0    I have felt sad or miserable. 0    I have been so unhappy that I have been crying. 0    The thought of harming myself has occurred to me. 0    Edinburgh Postnatal Depression Scale Total 0             There are no preventive care reminders to display for this patient.  The following portions of the patient's history were reviewed and updated as appropriate: allergies, current medications, past family history, past medical history, past social history, past surgical history, and problem list.  Review of Systems Pertinent items are noted in HPI.  Objective:  BP 112/67   Pulse 97   Ht 5' 1.5" (1.562 m)    Wt 144 lb 6.4 oz (65.5 kg)   LMP 11/06/2021 (Exact Date)   Breastfeeding No   BMI 26.84 kg/m    General:  alert, cooperative, and no distress   Breasts:  not indicated  Lungs: clear to auscultation bilaterally  Heart:  regular rate and rhythm  Abdomen: soft, non-tender; bowel sounds normal; no masses,  no organomegaly   Wound N/a  GU exam:  not indicated       Assessment:     normal postpartum exam.   Plan:   Essential components of care per ACOG recommendations:  1.  Mood and well being: Patient with negative depression screening today. Reviewed local resources for support.  - Patient tobacco use? Yes. Patient desires to quit? No.  Pt vaps - hx of drug use? No.    2. Infant care and feeding:  -Patient currently breastmilk feeding? No.  -Social determinants of health (SDOH) reviewed in EPIC. No concerns.  3. Sexuality, contraception and birth spacing - Patient does not want a pregnancy in the next year.  Desired family size is 2 children.  - Reviewed reproductive life planning. Reviewed contraceptive methods based on pt preferences and effectiveness.  Patient desired Female Sterilization today.   - Discussed birth spacing of 18 months  4. Sleep and fatigue -Encouraged family/partner/community support of 4 hrs  of uninterrupted sleep to help with mood and fatigue  5. Physical Recovery  - Discussed patients delivery and complications. She describes her labor as good. - Patient had a Vaginal, no problems at delivery. Patient had no lacerations. Perineal healing reviewed. Patient expressed understanding - Patient has urinary incontinence? No. - Patient is safe to resume physical and sexual activity  6.  Health Maintenance - HM due items addressed Yes - Last pap smear 02/28/22.  Pap smear not done at today's visit.  -Breast Cancer screening indicated? No.   7. Chronic Disease/Pregnancy Condition follow up:  HIV, pt has follow with ID in february    8. Contraception:   Will schedule laparoscopic tubal ligation with salpingectomy.  Pt is aware of other alternatives and desires permanent sterilization.  - PCP follow up  Warden Fillers, MD Center for [place], [facility]

## 2022-09-04 NOTE — Patient Instructions (Signed)
[facility] at [facility] [address] [place], [place] [address] [phone] (main office) [phone] (Kassidy Dockendorf's office)  New Parent Support Groups [email or web address]   

## 2022-09-09 ENCOUNTER — Encounter: Payer: Self-pay | Admitting: *Deleted

## 2022-09-19 ENCOUNTER — Ambulatory Visit: Payer: Medicaid Other | Admitting: Nurse Practitioner

## 2022-09-30 ENCOUNTER — Encounter: Payer: Self-pay | Admitting: Obstetrics and Gynecology

## 2022-09-30 ENCOUNTER — Ambulatory Visit (INDEPENDENT_AMBULATORY_CARE_PROVIDER_SITE_OTHER): Payer: Medicaid Other | Admitting: Obstetrics and Gynecology

## 2022-09-30 ENCOUNTER — Other Ambulatory Visit: Payer: Self-pay

## 2022-09-30 VITALS — BP 90/67 | HR 101 | Ht 61.5 in | Wt 143.2 lb

## 2022-09-30 DIAGNOSIS — Z01818 Encounter for other preprocedural examination: Secondary | ICD-10-CM

## 2022-09-30 DIAGNOSIS — Z3009 Encounter for other general counseling and advice on contraception: Secondary | ICD-10-CM

## 2022-09-30 NOTE — H&P (View-Only) (Signed)
OB/GYN Pre-Op History and Physical  Nicole Morgan is a 27 y.o. [id number] presenting for preoperative appointment.  PT desires permanent sterilization.  Patient desires permanent sterilization.  Other reversible forms of contraception (over the counter/barrier methods; hormonal contraceptives including pill, patch, ring, Depo-Provera injection, Nexplanon implant; hormonal IUDs Skyla and Mirena; nonhormonal copper IUD Paragard) were discussed with patient; she declined all these modalities. Also discussed the option of vasectomy for her female partner; she also declined this option. She was given the choice between laparoscopic bilateral tubal sterilization using Filshie clips or laparoscopic bilateral salpingectomy. For the Filshie clip sterilization, she was told she will have one incision in her umbilicus.  Failure risk of 1-2 % with increased risk of ectopic gestation if pregnancy occurs was also discussed with patient. For the bilateral salpingectomy, she was told that both tubes will be resected via three small incisions; the failure risk of less than 1%.  Any future pregnancies will have to be attempted via IVF or other fertility procedures.  Reiterated permanence and irreversibility of both procedures; in the case of Filshie clip application, attempts to reverse tubal sterilization are often not successful.  Also emphasized risk of regret which is noted more in patients less than the age of 37.  All questions were answered. She desires laparoscopic bilateral salpingectomy.  Other risks of the procedure were discussed with patient including but not limited to: bleeding, infection, injury to surrounding organs and need for additional procedures.  Also discussed possibility of post-tubal pain syndrome. Patient verbalized understanding of these risks and wants to proceed with this procedure.  She was told routine preoperative instructions of having nothing to eat or drink after midnight on the day prior to  surgery and also coming to the hospital 1.5 hours prior to her time of surgery were also emphasized.  She was told she may be called for a preoperative appointment about a week prior to surgery and will be given further preoperative instructions at that visit. Printed patient education handouts about the procedure were given to the patient to review at home.        Past Medical History:  Diagnosis Date   Anxiety 2020   Bipolar 1 disorder ([place]) 2020   Cystic fibrosis carrier    Depression    no meds   Eczema    HIV (human immunodeficiency virus infection) ([place]) 04/2022   Incompetent cervix    Insomnia 2020   PTSD (post-traumatic stress disorder) 2020   UTI (urinary tract infection)     Past Surgical History:  Procedure Laterality Date   CERVICAL CERCLAGE  06/06/2012   Procedure: CERCLAGE CERVICAL;  Surgeon: Melina Schools, MD;  Location: [facility];  Service: Gynecology;  Laterality: N/A;   PLACEMENT OF BREAST IMPLANTS     WISDOM TOOTH EXTRACTION      OB History  Gravida Para Term Preterm AB Living  2 2 2  0 0 2  SAB IAB Ectopic Multiple Live Births  0 0 0 0 2    # Outcome Date GA Lbr Len/2nd Weight Sex Delivery Anes PTL Lv  2 Term 07/25/22 [redacted]w[redacted]d / 00:25 6 lb 1 oz (2.75 kg) F Vag-Spont EPI  LIV  1 Term 10/16/12 [redacted]w[redacted]d / 00:33 7 lb 12.2 oz (3.52 kg) M Vag-Spont EPI  LIV     Birth Comments: cerclage for incompetent cervix    Social History   Socioeconomic History   Marital status: Single    Spouse name: Not on file   Number  of children: 1   Years of education: Not on file   Highest education level: Some college, no degree  Occupational History    Employer: [employer]  Tobacco Use   Smoking status: Every Day    Types: E-cigarettes    Last attempt to quit: 04/2022    Years since quitting: 0.4   Smokeless tobacco: Never  Vaping Use   Vaping Use: Every day  Substance and Sexual Activity   Alcohol use: Not Currently    Comment: 1-2 beers every now and then    Drug use: Not Currently    Types: Marijuana    Comment: last smoked 05/24/2022   Sexual activity: Not Currently  Other Topics Concern   Not on file  Social History Narrative   Lives with child    Right handed   Caffeine: about 3 cups/day maybe more   Social Determinants of Health   Financial Resource Strain: Medium Risk (05/16/2022)   Overall Financial Resource Strain (CARDIA)    Difficulty of Paying Living Expenses: Somewhat hard  Food Insecurity: No Food Insecurity (09/30/2022)   Hunger Vital Sign    Worried About Running Out of Food in the Last Year: Never true    Ran Out of Food in the Last Year: Never true  Recent Concern: Food Insecurity - Food Insecurity Present (07/24/2022)   Hunger Vital Sign    Worried About Running Out of Food in the Last Year: Sometimes true    Ran Out of Food in the Last Year: Never true  Transportation Needs: No Transportation Needs (09/30/2022)   PRAPARE - Administrator, Civil Service (Medical): No    Lack of Transportation (Non-Medical): No  Physical Activity: Sufficiently Active (05/16/2022)   Exercise Vital Sign    Days of Exercise per Week: 7 days    Minutes of Exercise per Session: 30 min  Stress: Stress Concern Present (05/16/2022)   [employer] of Occupational Health - Occupational Stress Questionnaire    Feeling of Stress : Very much  Social Connections: Moderately Integrated (05/16/2022)   Social Connection and Isolation Panel [NHANES]    Frequency of Communication with Friends and Family: More than three times a week    Frequency of Social Gatherings with Friends and Family: Three times a week    Attends Religious Services: More than 4 times per year    Active Member of Clubs or Organizations: Yes    Attends Engineer, structural: More than 4 times per year    Marital Status: Never married    Family History  Problem Relation Age of Onset   Bipolar disorder Mother        schizophrenic   Mental illness Mother         schizophrenia   Deep vein thrombosis Maternal Aunt    Alcohol abuse Maternal Grandmother    Mental illness Maternal Grandmother    Bipolar disorder Maternal Grandmother        schizophrenic    Cancer Maternal Grandmother        lymph nodes   Migraines Neg Hx    Headache Neg Hx     (Not in a hospital admission)   No Known Allergies  Review of Systems: Negative except for what is mentioned in HPI.     Physical Exam: BP 90/67   Pulse (!) 101   Ht 5' 1.5" (1.562 m)   Wt 143 lb 3.2 oz (65 kg)   LMP 09/11/2022 (Approximate)   Breastfeeding  No   BMI 26.62 kg/m  CONSTITUTIONAL: Well-developed, well-nourished female in no acute distress.  HENT:  Normocephalic, atraumatic, External right and left ear normal. Oropharynx is clear and moist EYES: Conjunctivae and EOM are normal.  NECK: Normal range of motion, supple, no masses SKIN: Skin is warm and dry. No rash noted. Not diaphoretic. No erythema. No pallor. NEUROLGIC: Alert and oriented to person, place, and time. Normal reflexes, muscle tone coordination. No cranial nerve deficit noted. PSYCHIATRIC: Normal mood and affect. Normal behavior. Normal judgment and thought content. CARDIOVASCULAR: Normal heart rate noted, regular rhythm RESPIRATORY: Effort and breath sounds normal, no problems with respiration noted ABDOMEN: Soft, nontender, nondistended PELVIC: Deferred MUSCULOSKELETAL: Normal range of motion. No edema and no tenderness. 2+ distal pulses.   Pertinent Labs/Studies:   No results found for this or any previous visit (from the past 72 hour(s)).     Assessment and Plan :Nicole Morgan is a 27 y.o. [id number] here for preoperative visit for laparoscopic bilateral salpingectomy..   Plan for laparoscopic bilateral salpingectomy NPO Admission labs ordered VS Q4 Pt is HIV positive, advise eye protection and double glove for surgery.   Mariel Aloe, M.D. Attending Obstetrician & Gynecologist, [facility] for [employer], [facility]

## 2022-09-30 NOTE — Progress Notes (Signed)
OB/GYN Pre-Op History and Physical  Nicole Morgan is a 27 y.o. [id number] presenting for preoperative appointment.  PT desires permanent sterilization.  Patient desires permanent sterilization.  Other reversible forms of contraception (over the counter/barrier methods; hormonal contraceptives including pill, patch, ring, Depo-Provera injection, Nexplanon implant; hormonal IUDs Skyla and Mirena; nonhormonal copper IUD Paragard) were discussed with patient; she declined all these modalities. Also discussed the option of vasectomy for her female partner; she also declined this option. She was given the choice between laparoscopic bilateral tubal sterilization using Filshie clips or laparoscopic bilateral salpingectomy. For the Filshie clip sterilization, she was told she will have one incision in her umbilicus.  Failure risk of 1-2 % with increased risk of ectopic gestation if pregnancy occurs was also discussed with patient. For the bilateral salpingectomy, she was told that both tubes will be resected via three small incisions; the failure risk of less than 1%.  Any future pregnancies will have to be attempted via IVF or other fertility procedures.  Reiterated permanence and irreversibility of both procedures; in the case of Filshie clip application, attempts to reverse tubal sterilization are often not successful.  Also emphasized risk of regret which is noted more in patients less than the age of 37.  All questions were answered. She desires laparoscopic bilateral salpingectomy.  Other risks of the procedure were discussed with patient including but not limited to: bleeding, infection, injury to surrounding organs and need for additional procedures.  Also discussed possibility of post-tubal pain syndrome. Patient verbalized understanding of these risks and wants to proceed with this procedure.  She was told routine preoperative instructions of having nothing to eat or drink after midnight on the day prior to  surgery and also coming to the hospital 1.5 hours prior to her time of surgery were also emphasized.  She was told she may be called for a preoperative appointment about a week prior to surgery and will be given further preoperative instructions at that visit. Printed patient education handouts about the procedure were given to the patient to review at home.        Past Medical History:  Diagnosis Date   Anxiety 2020   Bipolar 1 disorder ([place]) 2020   Cystic fibrosis carrier    Depression    no meds   Eczema    HIV (human immunodeficiency virus infection) ([place]) 04/2022   Incompetent cervix    Insomnia 2020   PTSD (post-traumatic stress disorder) 2020   UTI (urinary tract infection)     Past Surgical History:  Procedure Laterality Date   CERVICAL CERCLAGE  06/06/2012   Procedure: CERCLAGE CERVICAL;  Surgeon: Melina Schools, MD;  Location: [facility];  Service: Gynecology;  Laterality: N/A;   PLACEMENT OF BREAST IMPLANTS     WISDOM TOOTH EXTRACTION      OB History  Gravida Para Term Preterm AB Living  2 2 2  0 0 2  SAB IAB Ectopic Multiple Live Births  0 0 0 0 2    # Outcome Date GA Lbr Len/2nd Weight Sex Delivery Anes PTL Lv  2 Term 07/25/22 [redacted]w[redacted]d / 00:25 6 lb 1 oz (2.75 kg) F Vag-Spont EPI  LIV  1 Term 10/16/12 [redacted]w[redacted]d / 00:33 7 lb 12.2 oz (3.52 kg) M Vag-Spont EPI  LIV     Birth Comments: cerclage for incompetent cervix    Social History   Socioeconomic History   Marital status: Single    Spouse name: Not on file   Number  of children: 1   Years of education: Not on file   Highest education level: Some college, no degree  Occupational History    Employer: [employer]  Tobacco Use   Smoking status: Every Day    Types: E-cigarettes    Last attempt to quit: 04/2022    Years since quitting: 0.4   Smokeless tobacco: Never  Vaping Use   Vaping Use: Every day  Substance and Sexual Activity   Alcohol use: Not Currently    Comment: 1-2 beers every now and then    Drug use: Not Currently    Types: Marijuana    Comment: last smoked 05/24/2022   Sexual activity: Not Currently  Other Topics Concern   Not on file  Social History Narrative   Lives with child    Right handed   Caffeine: about 3 cups/day maybe more   Social Determinants of Health   Financial Resource Strain: Medium Risk (05/16/2022)   Overall Financial Resource Strain (CARDIA)    Difficulty of Paying Living Expenses: Somewhat hard  Food Insecurity: No Food Insecurity (09/30/2022)   Hunger Vital Sign    Worried About Running Out of Food in the Last Year: Never true    Ran Out of Food in the Last Year: Never true  Recent Concern: Food Insecurity - Food Insecurity Present (07/24/2022)   Hunger Vital Sign    Worried About Running Out of Food in the Last Year: Sometimes true    Ran Out of Food in the Last Year: Never true  Transportation Needs: No Transportation Needs (09/30/2022)   PRAPARE - Administrator, Civil Service (Medical): No    Lack of Transportation (Non-Medical): No  Physical Activity: Sufficiently Active (05/16/2022)   Exercise Vital Sign    Days of Exercise per Week: 7 days    Minutes of Exercise per Session: 30 min  Stress: Stress Concern Present (05/16/2022)   [employer] of Occupational Health - Occupational Stress Questionnaire    Feeling of Stress : Very much  Social Connections: Moderately Integrated (05/16/2022)   Social Connection and Isolation Panel [NHANES]    Frequency of Communication with Friends and Family: More than three times a week    Frequency of Social Gatherings with Friends and Family: Three times a week    Attends Religious Services: More than 4 times per year    Active Member of Clubs or Organizations: Yes    Attends Engineer, structural: More than 4 times per year    Marital Status: Never married    Family History  Problem Relation Age of Onset   Bipolar disorder Mother        schizophrenic   Mental illness Mother         schizophrenia   Deep vein thrombosis Maternal Aunt    Alcohol abuse Maternal Grandmother    Mental illness Maternal Grandmother    Bipolar disorder Maternal Grandmother        schizophrenic    Cancer Maternal Grandmother        lymph nodes   Migraines Neg Hx    Headache Neg Hx     (Not in a hospital admission)   No Known Allergies  Review of Systems: Negative except for what is mentioned in HPI.     Physical Exam: BP 90/67   Pulse (!) 101   Ht 5' 1.5" (1.562 m)   Wt 143 lb 3.2 oz (65 kg)   LMP 09/11/2022 (Approximate)   Breastfeeding  No   BMI 26.62 kg/m  CONSTITUTIONAL: Well-developed, well-nourished female in no acute distress.  HENT:  Normocephalic, atraumatic, External right and left ear normal. Oropharynx is clear and moist EYES: Conjunctivae and EOM are normal.  NECK: Normal range of motion, supple, no masses SKIN: Skin is warm and dry. No rash noted. Not diaphoretic. No erythema. No pallor. NEUROLGIC: Alert and oriented to person, place, and time. Normal reflexes, muscle tone coordination. No cranial nerve deficit noted. PSYCHIATRIC: Normal mood and affect. Normal behavior. Normal judgment and thought content. CARDIOVASCULAR: Normal heart rate noted, regular rhythm RESPIRATORY: Effort and breath sounds normal, no problems with respiration noted ABDOMEN: Soft, nontender, nondistended PELVIC: Deferred MUSCULOSKELETAL: Normal range of motion. No edema and no tenderness. 2+ distal pulses.   Pertinent Labs/Studies:   No results found for this or any previous visit (from the past 72 hour(s)).     Assessment and Plan :Nicole Morgan is a 27 y.o. [id number] here for preoperative visit for laparoscopic bilateral salpingectomy..   Plan for laparoscopic bilateral salpingectomy NPO Admission labs ordered VS Q4 Pt is HIV positive, advise eye protection and double glove for surgery.   Mariel Aloe, M.D. Attending Obstetrician & Gynecologist, [facility] for [employer], [facility]

## 2022-10-02 ENCOUNTER — Ambulatory Visit (HOSPITAL_COMMUNITY): Payer: Medicaid Other | Admitting: Psychiatry

## 2022-10-07 ENCOUNTER — Encounter (HOSPITAL_COMMUNITY): Payer: Self-pay | Admitting: Obstetrics and Gynecology

## 2022-10-07 ENCOUNTER — Other Ambulatory Visit: Payer: Self-pay

## 2022-10-07 NOTE — Anesthesia Preprocedure Evaluation (Addendum)
Anesthesia Evaluation  Patient identified by MRN, date of birth, ID band Patient awake    Reviewed: Allergy & Precautions, NPO status , Patient's Chart, lab work & pertinent test results  History of Anesthesia Complications Negative for: history of anesthetic complications  Airway Mallampati: II  TM Distance: >3 FB Neck ROM: Full    Dental  (+) Dental Advisory Given, Teeth Intact   Pulmonary neg shortness of breath, neg sleep apnea, neg COPD, neg recent URI, Current Smoker and Patient abstained from smoking.   Pulmonary exam normal breath sounds clear to auscultation       Cardiovascular negative cardio ROS  Rhythm:Regular Rate:Normal     Neuro/Psych  PSYCHIATRIC DISORDERS Anxiety Depression Bipolar Disorder   negative neurological ROS     GI/Hepatic negative GI ROS,,,(+)     substance abuse (last used 09/26/2022)  marijuana use  Endo/Other  negative endocrine ROS    Renal/GU negative Renal ROS     Musculoskeletal negative musculoskeletal ROS (+)    Abdominal  (+) - obese  Peds  Hematology  (+) HIV  Anesthesia Other Findings   Reproductive/Obstetrics                             Anesthesia Physical Anesthesia Plan  ASA: 2  Anesthesia Plan: General   Post-op Pain Management: Toradol IV (intra-op)* and Tylenol PO (pre-op)*   Induction: Intravenous  PONV Risk Score and Plan: 4 or greater and Dexamethasone, Ondansetron, Midazolam and Scopolamine patch - Pre-op  Airway Management Planned: Oral ETT  Additional Equipment:   Intra-op Plan:   Post-operative Plan: Extubation in OR  Informed Consent: I have reviewed the patients History and Physical, chart, labs and discussed the procedure including the risks, benefits and alternatives for the proposed anesthesia with the patient or authorized representative who has indicated his/her understanding and acceptance.     Dental advisory  given  Plan Discussed with: Anesthesiologist and CRNA  Anesthesia Plan Comments: (Risks of general anesthesia discussed including, but not limited to, sore throat, hoarse voice, chipped/damaged teeth, injury to vocal cords, nausea and vomiting, allergic reactions, lung infection, heart attack, stroke, and death. All questions answered. )       Anesthesia Quick Evaluation

## 2022-10-07 NOTE — Progress Notes (Addendum)
Nicole Morgan denies chest pain or shortness of breath. Patient denies having any s/s of Covid in her household, also denies any known exposure to Covid.  Nicole Morgan's PCPis Ferd Hibbs, NP. I instructed Nicole Gugliotta to not vape or smoke marijuana any more today or in am before surgery.

## 2022-10-08 ENCOUNTER — Encounter (HOSPITAL_COMMUNITY): Payer: Self-pay | Admitting: Obstetrics and Gynecology

## 2022-10-08 ENCOUNTER — Ambulatory Visit (HOSPITAL_COMMUNITY)
Admission: RE | Admit: 2022-10-08 | Discharge: 2022-10-08 | Disposition: A | Discharge status: Home / Self Care | Payer: Medicaid Other | Attending: Obstetrics and Gynecology | Admitting: Obstetrics and Gynecology

## 2022-10-08 ENCOUNTER — Ambulatory Visit (HOSPITAL_BASED_OUTPATIENT_CLINIC_OR_DEPARTMENT_OTHER): Payer: Medicaid Other | Admitting: Anesthesiology

## 2022-10-08 ENCOUNTER — Other Ambulatory Visit: Payer: Self-pay

## 2022-10-08 ENCOUNTER — Encounter (HOSPITAL_COMMUNITY)
Admission: RE | Disposition: A | Discharge status: Home / Self Care | Payer: Self-pay | Source: Home / Self Care | Attending: Obstetrics and Gynecology

## 2022-10-08 ENCOUNTER — Ambulatory Visit (HOSPITAL_COMMUNITY): Payer: Medicaid Other | Admitting: Anesthesiology

## 2022-10-08 DIAGNOSIS — Z21 Asymptomatic human immunodeficiency virus [HIV] infection status: Secondary | ICD-10-CM | POA: Insufficient documentation

## 2022-10-08 DIAGNOSIS — Z302 Encounter for sterilization: Secondary | ICD-10-CM

## 2022-10-08 DIAGNOSIS — Z3009 Encounter for other general counseling and advice on contraception: Secondary | ICD-10-CM

## 2022-10-08 HISTORY — DX: Other psychoactive substance abuse, uncomplicated: F19.10

## 2022-10-08 HISTORY — PX: LAPAROSCOPIC BILATERAL SALPINGECTOMY: SHX5889

## 2022-10-08 LAB — COMPREHENSIVE METABOLIC PANEL
ALT: 16 U/L (ref 0–44)
AST: 18 U/L (ref 15–41)
Albumin: 3.7 g/dL (ref 3.5–5.0)
Alkaline Phosphatase: 69 U/L (ref 38–126)
Anion gap: 8 (ref 5–15)
BUN: 5 mg/dL — ABNORMAL LOW (ref 6–20)
CO2: 25 mmol/L (ref 22–32)
Calcium: 8.7 mg/dL — ABNORMAL LOW (ref 8.9–10.3)
Chloride: 107 mmol/L (ref 98–111)
Creatinine, Ser: 0.77 mg/dL (ref 0.44–1.00)
GFR, Estimated: >60 mL/min (ref >60)
Glucose, Bld: 93 mg/dL (ref 70–99)
Potassium: 3.8 mmol/L (ref 3.5–5.1)
Sodium: 140 mmol/L (ref 135–145)
Total Bilirubin: 0.4 mg/dL (ref 0.3–1.2)
Total Protein: 6.3 g/dL — ABNORMAL LOW (ref 6.5–8.1)

## 2022-10-08 LAB — POCT PREGNANCY, URINE: Preg Test, Ur: NEGATIVE

## 2022-10-08 LAB — TYPE AND SCREEN
ABO/RH(D): O NEG
Antibody Screen: POSITIVE

## 2022-10-08 LAB — CBC
HCT: 37.6 % (ref 36.0–46.0)
Hemoglobin: 12.7 g/dL (ref 12.0–15.0)
MCH: 30.6 pg (ref 26.0–34.0)
MCHC: 33.8 g/dL (ref 30.0–36.0)
MCV: 90.6 fL (ref 80.0–100.0)
Platelets: 356 10*3/uL (ref 150–400)
RBC: 4.15 MIL/uL (ref 3.87–5.11)
RDW: 15.7 % — ABNORMAL HIGH (ref 11.5–15.5)
WBC: 6 10*3/uL (ref 4.0–10.5)
nRBC: 0 % (ref 0.0–0.2)

## 2022-10-08 SURGERY — SALPINGECTOMY, BILATERAL, LAPAROSCOPIC
Anesthesia: General | Laterality: Bilateral

## 2022-10-08 MED ORDER — LACTATED RINGERS IV SOLN: INTRAVENOUS | Status: DC

## 2022-10-08 MED ORDER — PHENYLEPHRINE HCL-NACL 20-0.9 MG/250ML-% IV SOLN
INTRAVENOUS | Status: DC | PRN
  Administered 2022-10-08: 20 ug/min via INTRAVENOUS

## 2022-10-08 MED ORDER — POVIDONE-IODINE 10 % EX SWAB
2.0000 | Freq: Once | CUTANEOUS | Status: AC
  Administered 2022-10-08: 2 via TOPICAL

## 2022-10-08 MED ORDER — MIDAZOLAM HCL 2 MG/2ML IJ SOLN
INTRAMUSCULAR | Status: AC
  Filled 2022-10-08: qty 2

## 2022-10-08 MED ORDER — DEXAMETHASONE SODIUM PHOSPHATE 10 MG/ML IJ SOLN
INTRAMUSCULAR | Status: DC | PRN
  Administered 2022-10-08: 10 mg via INTRAVENOUS

## 2022-10-08 MED ORDER — ORAL CARE MOUTH RINSE: 15.0000 mL | Freq: Once | OROMUCOSAL | Status: AC

## 2022-10-08 MED ORDER — PROPOFOL 10 MG/ML IV BOLUS
INTRAVENOUS | Status: AC
  Filled 2022-10-08: qty 20

## 2022-10-08 MED ORDER — BUPIVACAINE HCL 0.25 % IJ SOLN
INTRAMUSCULAR | Status: DC | PRN
  Administered 2022-10-08: 17 mL

## 2022-10-08 MED ORDER — DEXMEDETOMIDINE HCL IN NACL 200 MCG/50ML IV SOLN
INTRAVENOUS | Status: DC | PRN
  Administered 2022-10-08: 20 ug via INTRAVENOUS

## 2022-10-08 MED ORDER — MIDAZOLAM HCL 2 MG/2ML IJ SOLN
INTRAMUSCULAR | Status: DC | PRN
  Administered 2022-10-08: 2 mg via INTRAVENOUS

## 2022-10-08 MED ORDER — KETOROLAC TROMETHAMINE 15 MG/ML IJ SOLN
15.0000 mg | INTRAMUSCULAR | Status: AC
  Administered 2022-10-08: 15 mg via INTRAVENOUS
  Filled 2022-10-08: qty 1

## 2022-10-08 MED ORDER — CEFAZOLIN SODIUM-DEXTROSE 2-4 GM/100ML-% IV SOLN
2.0000 g | INTRAVENOUS | Status: AC
  Administered 2022-10-08: 2 g via INTRAVENOUS
  Filled 2022-10-08: qty 100

## 2022-10-08 MED ORDER — LIDOCAINE 2% (20 MG/ML) 5 ML SYRINGE
INTRAMUSCULAR | Status: AC
  Filled 2022-10-08: qty 5

## 2022-10-08 MED ORDER — PROMETHAZINE HCL 25 MG/ML IJ SOLN: 6.2500 mg | INTRAMUSCULAR | Status: DC | PRN

## 2022-10-08 MED ORDER — CHLORHEXIDINE GLUCONATE 0.12 % MT SOLN
OROMUCOSAL | Status: AC
  Administered 2022-10-08: 15 mL via OROMUCOSAL
  Filled 2022-10-08: qty 15

## 2022-10-08 MED ORDER — EPHEDRINE 5 MG/ML INJ
INTRAVENOUS | Status: AC
  Filled 2022-10-08: qty 5

## 2022-10-08 MED ORDER — BUPIVACAINE HCL (PF) 0.25 % IJ SOLN
INTRAMUSCULAR | Status: AC
  Filled 2022-10-08: qty 30

## 2022-10-08 MED ORDER — FENTANYL CITRATE (PF) 250 MCG/5ML IJ SOLN
INTRAMUSCULAR | Status: AC
  Filled 2022-10-08: qty 5

## 2022-10-08 MED ORDER — DEXAMETHASONE SODIUM PHOSPHATE 10 MG/ML IJ SOLN
INTRAMUSCULAR | Status: AC
  Filled 2022-10-08: qty 2

## 2022-10-08 MED ORDER — LIDOCAINE 2% (20 MG/ML) 5 ML SYRINGE
INTRAMUSCULAR | Status: DC | PRN
  Administered 2022-10-08: 80 mg via INTRAVENOUS

## 2022-10-08 MED ORDER — OXYCODONE HCL 5 MG PO TABS: 5.0000 mg | ORAL_TABLET | Freq: Four times a day (QID) | ORAL | 0 refills | Status: DC | PRN

## 2022-10-08 MED ORDER — PROPOFOL 10 MG/ML IV BOLUS
INTRAVENOUS | Status: DC | PRN
  Administered 2022-10-08: 200 mg via INTRAVENOUS

## 2022-10-08 MED ORDER — FENTANYL CITRATE (PF) 100 MCG/2ML IJ SOLN: 25.0000 ug | INTRAMUSCULAR | Status: DC | PRN

## 2022-10-08 MED ORDER — SUGAMMADEX SODIUM 200 MG/2ML IV SOLN
INTRAVENOUS | Status: DC | PRN
  Administered 2022-10-08: 249.2 mg via INTRAVENOUS

## 2022-10-08 MED ORDER — DEXAMETHASONE SODIUM PHOSPHATE 10 MG/ML IJ SOLN
INTRAMUSCULAR | Status: AC
  Filled 2022-10-08: qty 1

## 2022-10-08 MED ORDER — SOD CITRATE-CITRIC ACID 500-334 MG/5ML PO SOLN
30.0000 mL | ORAL | Status: AC
  Administered 2022-10-08: 30 mL via ORAL
  Filled 2022-10-08: qty 30

## 2022-10-08 MED ORDER — ONDANSETRON HCL 4 MG/2ML IJ SOLN
INTRAMUSCULAR | Status: DC | PRN
  Administered 2022-10-08: 4 mg via INTRAVENOUS

## 2022-10-08 MED ORDER — AMISULPRIDE (ANTIEMETIC) 5 MG/2ML IV SOLN: 10.0000 mg | Freq: Once | INTRAVENOUS | Status: DC | PRN

## 2022-10-08 MED ORDER — ROCURONIUM BROMIDE 10 MG/ML (PF) SYRINGE
PREFILLED_SYRINGE | INTRAVENOUS | Status: AC
  Filled 2022-10-08: qty 10

## 2022-10-08 MED ORDER — SUGAMMADEX SODIUM 500 MG/5ML IV SOLN
INTRAVENOUS | Status: AC
  Filled 2022-10-08: qty 5

## 2022-10-08 MED ORDER — PHENYLEPHRINE 80 MCG/ML (10ML) SYRINGE FOR IV PUSH (FOR BLOOD PRESSURE SUPPORT)
PREFILLED_SYRINGE | INTRAVENOUS | Status: DC | PRN
  Administered 2022-10-08: 80 ug via INTRAVENOUS

## 2022-10-08 MED ORDER — ONDANSETRON HCL 4 MG/2ML IJ SOLN
INTRAMUSCULAR | Status: AC
  Filled 2022-10-08: qty 4

## 2022-10-08 MED ORDER — CHLORHEXIDINE GLUCONATE 0.12 % MT SOLN: 15.0000 mL | Freq: Once | OROMUCOSAL | Status: AC

## 2022-10-08 MED ORDER — KETOROLAC TROMETHAMINE 30 MG/ML IJ SOLN
INTRAMUSCULAR | Status: DC | PRN
  Administered 2022-10-08: 15 mg via INTRAVENOUS

## 2022-10-08 MED ORDER — SUCCINYLCHOLINE CHLORIDE 200 MG/10ML IV SOSY
PREFILLED_SYRINGE | INTRAVENOUS | Status: AC
  Filled 2022-10-08: qty 10

## 2022-10-08 MED ORDER — IBUPROFEN 600 MG PO TABS: 600.0000 mg | ORAL_TABLET | Freq: Four times a day (QID) | ORAL | 2 refills | Status: DC | PRN

## 2022-10-08 MED ORDER — GLYCOPYRROLATE PF 0.2 MG/ML IJ SOSY
PREFILLED_SYRINGE | INTRAMUSCULAR | Status: DC | PRN
  Administered 2022-10-08: .2 mg via INTRAVENOUS

## 2022-10-08 MED ORDER — FENTANYL CITRATE (PF) 250 MCG/5ML IJ SOLN
INTRAMUSCULAR | Status: DC | PRN
  Administered 2022-10-08 (×3): 50 ug via INTRAVENOUS
  Administered 2022-10-08: 100 ug via INTRAVENOUS

## 2022-10-08 MED ORDER — ROCURONIUM BROMIDE 10 MG/ML (PF) SYRINGE
PREFILLED_SYRINGE | INTRAVENOUS | Status: DC | PRN
  Administered 2022-10-08: 30 mg via INTRAVENOUS
  Administered 2022-10-08: 40 mg via INTRAVENOUS
  Administered 2022-10-08: 30 mg via INTRAVENOUS

## 2022-10-08 MED ORDER — 0.9 % SODIUM CHLORIDE (POUR BTL) OPTIME
TOPICAL | Status: DC | PRN
  Administered 2022-10-08: 1000 mL

## 2022-10-08 MED ORDER — ONDANSETRON HCL 4 MG/2ML IJ SOLN
INTRAMUSCULAR | Status: AC
  Filled 2022-10-08: qty 2

## 2022-10-08 MED ORDER — PROPOFOL 500 MG/50ML IV EMUL
INTRAVENOUS | Status: DC | PRN
  Administered 2022-10-08: 50 ug/kg/min via INTRAVENOUS

## 2022-10-08 MED ORDER — ACETAMINOPHEN 500 MG PO TABS
1000.0000 mg | ORAL_TABLET | Freq: Once | ORAL | Status: AC
  Administered 2022-10-08: 1000 mg via ORAL
  Filled 2022-10-08: qty 2

## 2022-10-08 MED ORDER — KETOROLAC TROMETHAMINE 30 MG/ML IJ SOLN
INTRAMUSCULAR | Status: AC
  Filled 2022-10-08: qty 1

## 2022-10-08 SURGICAL SUPPLY — 30 items
APPLICATOR ARISTA FLEXITIP XL (MISCELLANEOUS) IMPLANT
DERMABOND ADVANCED .7 DNX12 (GAUZE/BANDAGES/DRESSINGS) ×1 IMPLANT
DRSG OPSITE POSTOP 3X4 (GAUZE/BANDAGES/DRESSINGS) ×1 IMPLANT
DURAPREP 26ML APPLICATOR (WOUND CARE) ×1 IMPLANT
GLOVE BIOGEL PI IND STRL 8 (GLOVE) ×1 IMPLANT
GLOVE SURG ORTHO 8.0 STRL STRW (GLOVE) ×1 IMPLANT
GOWN STRL REUS W/ TWL LRG LVL3 (GOWN DISPOSABLE) ×1 IMPLANT
GOWN STRL REUS W/ TWL XL LVL3 (GOWN DISPOSABLE) ×1 IMPLANT
GOWN STRL REUS W/TWL LRG LVL3 (GOWN DISPOSABLE) ×1
GOWN STRL REUS W/TWL XL LVL3 (GOWN DISPOSABLE) ×1
HEMOSTAT ARISTA ABSORB 3G PWDR (HEMOSTASIS) IMPLANT
IRRIG SUCT STRYKERFLOW 2 WTIP (MISCELLANEOUS)
IRRIGATION SUCT STRKRFLW 2 WTP (MISCELLANEOUS) IMPLANT
KIT TURNOVER KIT B (KITS) ×1 IMPLANT
LIGASURE VESSEL 5MM BLUNT TIP (ELECTROSURGICAL) ×1 IMPLANT
NS IRRIG 1000ML POUR BTL (IV SOLUTION) ×1 IMPLANT
PACK LAPAROSCOPY BASIN (CUSTOM PROCEDURE TRAY) ×1 IMPLANT
PACK TRENDGUARD 450 HYBRID PRO (MISCELLANEOUS) ×1 IMPLANT
POUCH SPECIMEN RETRIEVAL 10MM (ENDOMECHANICALS) IMPLANT
PROTECTOR NERVE ULNAR (MISCELLANEOUS) ×2 IMPLANT
SET TUBE SMOKE EVAC HIGH FLOW (TUBING) ×1 IMPLANT
SLEEVE XCEL OPT CAN 5 100 (ENDOMECHANICALS) ×1 IMPLANT
SUT MNCRL AB 4-0 PS2 18 (SUTURE) ×1 IMPLANT
SUT VICRYL 0 UR6 27IN ABS (SUTURE) ×1 IMPLANT
TOWEL GREEN STERILE FF (TOWEL DISPOSABLE) ×2 IMPLANT
TRAY FOLEY W/BAG SLVR 14FR (SET/KITS/TRAYS/PACK) ×1 IMPLANT
TRENDGUARD 450 HYBRID PRO PACK (MISCELLANEOUS) ×1
TROCAR XCEL NON-BLD 11X100MML (ENDOMECHANICALS) ×1 IMPLANT
TROCAR XCEL NON-BLD 5MMX100MML (ENDOMECHANICALS) ×1 IMPLANT
WARMER LAPAROSCOPE (MISCELLANEOUS) ×1 IMPLANT

## 2022-10-08 NOTE — Anesthesia Procedure Notes (Signed)
Procedure Name: Intubation Date/Time: 10/08/2022 12:50 PM  Performed by: Minerva Ends, CRNAPre-anesthesia Checklist: Patient identified, Emergency Drugs available, Suction available and Patient being monitored Patient Re-evaluated:Patient Re-evaluated prior to induction Oxygen Delivery Method: Circle system utilized Preoxygenation: Pre-oxygenation with 100% oxygen Induction Type: IV induction Ventilation: Mask ventilation without difficulty Laryngoscope Size: Mac and 3 Grade View: Grade I Tube type: Oral Tube size: 7.0 mm Number of attempts: 1 Airway Equipment and Method: Stylet and Oral airway Placement Confirmation: ETT inserted through vocal cords under direct vision, positive ETCO2 and breath sounds checked- equal and bilateral Secured at: 22 cm Tube secured with: Tape Dental Injury: Teeth and Oropharynx as per pre-operative assessment

## 2022-10-08 NOTE — Transfer of Care (Signed)
Immediate Anesthesia Transfer of Care Note  Patient: Nicole Morgan  Procedure(s) Performed: LAPAROSCOPIC BILATERAL SALPINGECTOMY (Bilateral)  Patient Location: PACU  Anesthesia Type:General  Level of Consciousness: awake, alert , and oriented  Airway & Oxygen Therapy: Patient Spontanous Breathing  Post-op Assessment: Report given to RN and Post -op Vital signs reviewed and stable  Post vital signs: Reviewed and stable  Last Vitals:  Vitals Value Taken Time  BP 106/59 10/08/22 1415  Temp 98   Pulse 93 10/08/22 1417  Resp 15 10/08/22 1417  SpO2 94 % 10/08/22 1417  Vitals shown include unvalidated device data.  Last Pain:  Vitals:   10/08/22 1027  TempSrc:   PainSc: 0-No pain         Complications: No notable events documented.

## 2022-10-08 NOTE — Interval H&P Note (Signed)
History and Physical Interval Note:  10/08/2022 11:37 AM  Nicole Morgan  has presented today for surgery, with the diagnosis of Undesired Fertility.  The various methods of treatment have been discussed with the patient and family. After consideration of risks, benefits and other options for treatment, the patient has consented to  Procedure(s): LAPAROSCOPIC BILATERAL SALPINGECTOMY (Bilateral) as a surgical intervention.  The patient's history has been reviewed, patient examined, no change in status, stable for surgery.  I have reviewed the patient's chart and labs.  Questions were answered to the patient's satisfaction.     Griffin Basil

## 2022-10-08 NOTE — Anesthesia Postprocedure Evaluation (Signed)
Anesthesia Post Note  Patient: Nicole Morgan  Procedure(s) Performed: LAPAROSCOPIC BILATERAL SALPINGECTOMY (Bilateral)     Patient location during evaluation: PACU Anesthesia Type: General Level of consciousness: awake Pain management: pain level controlled Vital Signs Assessment: post-procedure vital signs reviewed and stable Respiratory status: spontaneous breathing, nonlabored ventilation and respiratory function stable Cardiovascular status: blood pressure returned to baseline and stable Postop Assessment: no apparent nausea or vomiting Anesthetic complications: no   No notable events documented.  Last Vitals:  Vitals:   10/08/22 1430 10/08/22 1445  BP: 106/78 (!) 122/94  Pulse: 89 93  Resp: 18 15  Temp:  36.7 C  SpO2: 98% 97%    Last Pain:  Vitals:   10/08/22 1445  TempSrc:   PainSc: 0-No pain                 Nilda Simmer

## 2022-10-08 NOTE — Op Note (Signed)
Nicole Morgan PROCEDURE DATE: 10/08/2022   PREOPERATIVE DIAGNOSIS:  Undesired fertility  POSTOPERATIVE DIAGNOSIS:  Undesired fertility  PROCEDURE:  Laparoscopic Bilateral Salpingectomy   SURGEON:  Dr. Lynnda Shields  ANESTHESIA:  General endotracheal  COMPLICATIONS:  None immediate.  ESTIMATED BLOOD LOSS:  5 ml.  FLUIDS: 1200 ml LR.  URINE OUTPUT:  30 ml of clear urine.  INDICATIONS: 27 y.o. [id number] with undesired fertility, desires permanent sterilization. Other reversible forms of contraception were discussed with patient; she declines all other modalities.  Risks of procedure discussed with patient including permanence of method, risk of regret, bleeding, infection, injury to surrounding organs and need for additional procedures including laparotomy.  Failure risk less than 0.5% with increased risk of ectopic gestation if pregnancy occurs was also discussed with patient.  Written informed consent was obtained.    FINDINGS:  Normal uterus, fallopian tubes, and ovaries.  TECHNIQUE:  The patient was taken to the operating room where general anesthesia was obtained without difficulty.  She was then placed in the dorsal lithotomy position and prepared and draped in sterile fashion.  After an adequate timeout was performed, a bivalved speculum was then placed in the patient's vagina, and the anterior lip of cervix grasped with the single-tooth tenaculum.  The Hulka uterine manipulator was then advanced into the uterus and secured.  The speculum was removed from the vagina as was the tenaculum.  Attention was then turned to the patient's abdomen where a 6-mm skin incision was made in the umbilical fold.  The Optiview 5-mm trocar and sleeve were then advanced without difficulty with the laparoscope under direct visualization into the abdomen.  The abdomen was then insufflated with carbon dioxide gas.  Adequate pneumoperitoneum was obtained.  A survey of the patient's pelvis and abdomen revealed  the findings above. Bilateral 5-mm lower quadrant ports were then placed under direct visualization.  The fallopian tubes were transected from the uterine attachments and the underlying mesosalpinx with the LigaSure device allowing for bilateral salpingectomy.  The fallopian tubes were then removed from the abdomen under direct visualization.  The operative site was surveyed, and it was found to be hemostatic.   No intraoperative injury to other surrounding organs was noted.  The abdomen was desufflated and all instruments were then removed from the patient's abdomen.  All skin incisions were closed with 4-0 Vicryl and Dermabond.  The uterine manipulator was removed from the cervix without complications. The patient tolerated the procedure well.  Sponge, lap, and needle counts were correct times two.  The patient was then taken to the recovery room awake, extubated and in stable condition.  The patient will be discharged to home as per PACU criteria.  Routine postoperative instructions given.  She was prescribed oxycodone and Ibuprofen.  She will follow up in the clinic in 2-3 weeks for postoperative evaluation.   Lynnda Shields, MD, [place] Attending [place], [place]

## 2022-10-09 ENCOUNTER — Encounter: Payer: Self-pay | Admitting: Obstetrics and Gynecology

## 2022-10-09 ENCOUNTER — Encounter (HOSPITAL_COMMUNITY): Payer: Self-pay | Admitting: Obstetrics and Gynecology

## 2022-10-09 LAB — SURGICAL PATHOLOGY

## 2022-10-30 ENCOUNTER — Other Ambulatory Visit: Payer: Self-pay

## 2022-10-30 ENCOUNTER — Ambulatory Visit (INDEPENDENT_AMBULATORY_CARE_PROVIDER_SITE_OTHER): Payer: Medicaid Other | Admitting: Obstetrics and Gynecology

## 2022-10-30 ENCOUNTER — Encounter: Payer: Self-pay | Admitting: Obstetrics and Gynecology

## 2022-10-30 VITALS — BP 107/71 | HR 72 | Ht 61.5 in | Wt 133.2 lb

## 2022-10-30 DIAGNOSIS — Z4889 Encounter for other specified surgical aftercare: Secondary | ICD-10-CM

## 2022-10-30 NOTE — Progress Notes (Signed)
    Subjective:    Nicole Morgan is a 27 y.o. female who presents to the clinic status post laparoscopic salpingectomy on 10/08/22. The patient is not having any pain.  Eating a regular diet without difficulty. Bowel movements are normal. No other significant postoperative concerns.  The following portions of the patient's history were reviewed and updated as appropriate: allergies, current medications, past family history, past medical history, past social history, past surgical history, and problem list..  Last pap smear was normal on 02/28/22.  Review of Systems Pertinent items are noted in HPI.   Objective:   BP 107/71   Pulse 72   Ht 5' 1.5" (1.562 m)   Wt 133 lb 3.2 oz (60.4 kg)   LMP 10/27/2022 (Exact Date)   BMI 24.76 kg/m  Constitutional:  Well-developed, well-nourished female in no acute distress.   Skin: Skin is warm and dry, no rash noted, not diaphoretic,no erythema, no pallor.  Cardiovascular: Normal heart rate noted  Respiratory: Effort and breath sounds normal, no problems with respiration noted  Abdomen: Soft, bowel sounds active, non-tender, no abnormal masses  Incision: Healing well, no drainage, no erythema, no hernia, no seroma, no swelling, no dehiscence, incision well approximated  Pelvic:   Deferred   Surgical pathology () Normal bilateral tubes Assessment:   Doing well postoperatively.  Operative findings again reviewed. Pathology report discussed.   Plan:   1. Continue any current medications. 2. Wound care discussed. 3. Activity restrictions: none 4. Anticipated return to work: now. 5. Follow up as needed 6.  Routine preventative health maintenance measures emphasized. Please refer to After Visit Summary for other counseling recommendations.    Lynnda Shields, MD, [place] Attending [place] for [facility], [place]

## 2022-11-04 ENCOUNTER — Encounter: Payer: Self-pay | Admitting: Infectious Diseases

## 2022-11-04 ENCOUNTER — Ambulatory Visit (INDEPENDENT_AMBULATORY_CARE_PROVIDER_SITE_OTHER): Payer: Medicaid Other | Admitting: Infectious Diseases

## 2022-11-04 ENCOUNTER — Other Ambulatory Visit: Payer: Self-pay

## 2022-11-04 VITALS — BP 110/73 | HR 75 | Resp 16 | Ht 61.5 in | Wt 134.0 lb

## 2022-11-04 DIAGNOSIS — Z21 Asymptomatic human immunodeficiency virus [HIV] infection status: Secondary | ICD-10-CM

## 2022-11-04 MED ORDER — DOVATO 50-300 MG PO TABS: 1.0000 | ORAL_TABLET | Freq: Every day | ORAL | 11 refills | Status: DC

## 2022-11-04 NOTE — Patient Instructions (Addendum)
So glad you are doing well!   Will switch you to one pill once a day called DOVATO.  Can pick this up today from pharmacy.   Will take it the exact same way as the other medicine.  Hopefully you won't have any side effects and can continue tolerating it well.   Please stop by the lab now and make an appointment in 6 weeks to repeat your viral load then

## 2022-11-04 NOTE — Assessment & Plan Note (Addendum)
Very well controlled on once daily Tivicay and Descovy. Will consolidate to once daily Dovato for treatment. She is comfortable with this. 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 No concerns with access or adherence to medication. They are tolerating the medication well without side effects. No drug interactions identified. Pertinent lab tests ordered today. Repeat VL in 6w after medication switch.  No changes to insurance coverage - has pregnancy medicaid / UMAP coverage.  No dental needs today.  No concern over anxious/depressed mood.  Sexual health and family planning discussed - s/p BTL.   Will need to get hepatitis b and a boosters .   Return in about 6 weeks (around 12/16/2022). For labs only. She can see me again in 4 months for routine follow up after this check.

## 2022-11-04 NOTE — Progress Notes (Signed)
Name: Nicole Morgan  DOB: June 19, 1996 MRN: [id number] PCP: Ferd Hibbs, NP     Brief Narrative:  Nicole Morgan is a 27 y.o. female with HIV diagnosis 04/29/22 from [facility] as part of contact tracing event HIV Risk: sexual History of OIs:  Intake Labs: Hep B sAg (-), sAb (-), cAb (); Hep A (), Hep C (-) Quantiferon (-)   Previous Regimens: Tivicay + Descovy 04-2022  Genotypes: Not collected.   Subjective:   Chief Complaint  Patient presents with   Follow-up      HPI: Nicole Morgan is here for follow up. Labor induction at 62w2dfor oligohydramnios with vaginal delivery of a daughter on 07/25/2022. [facility] for [facility] 10[facility]08/20[facility] for contraception management.  She is bottle feeding her daughter.  Continues her Tivicay and Descovy everyday together. Not getting much sleep with her three month old. Father is local now, family ties are getting stronger which is a great thing she is happy about.        11/04/2022    8:38 AM  Depression screen PHQ 2/9  Decreased Interest 0  Down, Depressed, Hopeless 0  PHQ - 2 Score 0     Review of Systems  Constitutional:  Negative for chills, fever, malaise/fatigue and weight loss.  HENT:  Negative for sore throat.   Respiratory:  Negative for cough, sputum production and shortness of breath.   Cardiovascular: Negative.   Gastrointestinal:  Negative for abdominal pain, diarrhea and vomiting.  Musculoskeletal:  Negative for joint pain, myalgias and neck pain.  Skin:  Negative for rash.  Neurological:  Negative for headaches.  Psychiatric/Behavioral:  Negative for depression and substance abuse. The patient is not nervous/anxious.      Past Medical History:  Diagnosis Date   Anxiety 2020   Bipolar 1 disorder ([place] 2020   Cystic fibrosis carrier    Depression    no meds   Eczema    HIV (human immunodeficiency virus infection) ([place] 04/2022   Incompetent cervix    Insomnia 2020   PTSD (post-traumatic stress disorder) 2020    Substance abuse (HCC)    alcohol, cocaine, marijuana   UTI (urinary tract infection)     Outpatient Medications Prior to Visit  Medication Sig Dispense Refill   ibuprofen (ADVIL) 600 MG tablet Take 1 tablet (600 mg total) by mouth every 6 (six) hours as needed for headache, mild pain, moderate pain or cramping. 30 tablet 2   DESCOVY 200-25 MG tablet Take 1 tablet by mouth daily.     TIVICAY 50 MG tablet Take 50 mg by mouth daily.     oxyCODONE (OXY IR/ROXICODONE) 5 MG immediate release tablet Take 1 tablet (5 mg total) by mouth every 6 (six) hours as needed for severe pain or breakthrough pain. (Patient not taking: Reported on 10/30/2022) 24 tablet 0   No facility-administered medications prior to visit.     No Known Allergies  Social History   Tobacco Use   Smoking status: Every Day    Types: E-cigarettes    Last attempt to quit: 04/2022    Years since quitting: 0.5    Passive exposure: Current   Smokeless tobacco: Never  Vaping Use   Vaping Use: Every day   Substances: Nicotine  Substance Use Topics   Alcohol use: Not Currently   Drug use: Not Currently    Frequency: 1.0 times per week    Types: Marijuana    Comment: last day 09/26/22-    Family History  Problem Relation Age of Onset   Bipolar disorder Mother        schizophrenic   Mental illness Mother        schizophrenia   Deep vein thrombosis Maternal Aunt    Alcohol abuse Maternal Grandmother    Mental illness Maternal Grandmother    Bipolar disorder Maternal Grandmother        schizophrenic    Cancer Maternal Grandmother        lymph nodes   Migraines Neg Hx    Headache Neg Hx     Social History   Substance and Sexual Activity  Sexual Activity Not Currently     Objective:   Vitals:   11/04/22 0840  BP: 110/73  Pulse: 75  Resp: 16  SpO2: 98%  Weight: 134 lb (60.8 kg)  Height: 5' 1.5" (1.562 m)   Body mass index is 24.91 kg/m.  Physical Exam Constitutional:      Appearance: Normal  appearance. She is not ill-appearing.  HENT:     Mouth/Throat:     Mouth: Mucous membranes are moist.     Pharynx: Oropharynx is clear.  Eyes:     General: No scleral icterus. Cardiovascular:     Rate and Rhythm: Normal rate and regular rhythm.  Pulmonary:     Effort: Pulmonary effort is normal.  Neurological:     Mental Status: She is oriented to person, place, and time.  Psychiatric:        Mood and Affect: Mood normal.        Thought Content: Thought content normal.     Lab Results Lab Results  Component Value Date   WBC 6.0 10/08/2022   HGB 12.7 10/08/2022   HCT 37.6 10/08/2022   MCV 90.6 10/08/2022   PLT 356 10/08/2022    Lab Results  Component Value Date   CREATININE 0.77 10/08/2022   BUN 5 (L) 10/08/2022   NA 140 10/08/2022   K 3.8 10/08/2022   CL 107 10/08/2022   CO2 25 10/08/2022    Lab Results  Component Value Date   ALT 16 10/08/2022   AST 18 10/08/2022   ALKPHOS 69 10/08/2022   BILITOT 0.4 10/08/2022    No results found for: "CHOL", "HDL", "LDLCALC", "LDLDIRECT", "TRIG", "CHOLHDL" HIV 1 RNA Quant  Date Value  07/24/2022 <20 copies/mL  07/15/2022 Not Detected Copies/mL  05/14/2022 47 Copies/mL (H)   CD4 T Cell Abs (/uL)  Date Value  05/14/2022 1,287     Assessment & Plan:   Problem List Items Addressed This Visit       Unprioritized   HIV (human immunodeficiency virus infection) ([place]) - Primary    Very well controlled on once daily Tivicay and Descovy. Will consolidate to once daily Dovato for treatment. She is comfortable with this. 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 No concerns with access or adherence to medication. They are tolerating the medication well without side effects. No drug interactions identified. Pertinent lab tests ordered today. Repeat VL in 6w after medication switch.  No changes to insurance coverage - has pregnancy medicaid / [employer] coverage.  No dental needs today.  No concern over anxious/depressed mood.  Sexual health  and family planning discussed - s/p BTL.   Will need to get hepatitis b and a boosters .   Return in about 6 weeks (around 12/16/2022). For labs only. She can see me again in 4 months for routine follow up after this check.  Relevant Medications   dolutegravir-lamiVUDine (DOVATO) 50-300 MG tablet   Other Relevant Orders   HIV 1 RNA quant-no reflex-bld   T-helper cells (CD4) count   HIV 1 RNA quant-no reflex-bld    Janene Madeira, MSN, NP-C [facility] [place] Pager: [phone] Office: [phone]  11/04/22  9:36 AM

## 2022-11-05 LAB — T-HELPER CELLS (CD4) COUNT (NOT AT ARMC)
CD4 % Helper T Cell: 56 % (ref 33–65)
CD4 T Cell Abs: 1148 /uL (ref 400–1790)

## 2022-11-06 LAB — HIV-1 RNA QUANT-NO REFLEX-BLD
HIV 1 RNA Quant: NOT DETECTED Copies/mL
HIV-1 RNA Quant, Log: NOT DETECTED Log cps/mL
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
## 2022-11-08 ENCOUNTER — Ambulatory Visit (INDEPENDENT_AMBULATORY_CARE_PROVIDER_SITE_OTHER): Payer: Medicaid Other | Admitting: Family Medicine

## 2022-11-08 ENCOUNTER — Encounter: Payer: Self-pay | Admitting: Family Medicine

## 2022-11-08 ENCOUNTER — Other Ambulatory Visit: Payer: Self-pay | Admitting: Family Medicine

## 2022-11-08 VITALS — BP 111/72 | HR 84 | Resp 18 | Ht 61.81 in | Wt 133.0 lb

## 2022-11-08 DIAGNOSIS — F4310 Post-traumatic stress disorder, unspecified: Secondary | ICD-10-CM | POA: Insufficient documentation

## 2022-11-08 DIAGNOSIS — Z0000 Encounter for general adult medical examination without abnormal findings: Secondary | ICD-10-CM

## 2022-11-08 DIAGNOSIS — Z21 Asymptomatic human immunodeficiency virus [HIV] infection status: Secondary | ICD-10-CM

## 2022-11-08 DIAGNOSIS — Z131 Encounter for screening for diabetes mellitus: Secondary | ICD-10-CM

## 2022-11-08 DIAGNOSIS — F339 Major depressive disorder, recurrent, unspecified: Secondary | ICD-10-CM | POA: Insufficient documentation

## 2022-11-08 DIAGNOSIS — Z13220 Encounter for screening for lipoid disorders: Secondary | ICD-10-CM

## 2022-11-08 DIAGNOSIS — F411 Generalized anxiety disorder: Secondary | ICD-10-CM | POA: Insufficient documentation

## 2022-11-08 DIAGNOSIS — Z1329 Encounter for screening for other suspected endocrine disorder: Secondary | ICD-10-CM

## 2022-11-08 DIAGNOSIS — Q825 Congenital non-neoplastic nevus: Secondary | ICD-10-CM

## 2022-11-08 DIAGNOSIS — F1910 Other psychoactive substance abuse, uncomplicated: Secondary | ICD-10-CM

## 2022-11-08 DIAGNOSIS — F319 Bipolar disorder, unspecified: Secondary | ICD-10-CM

## 2022-11-08 DIAGNOSIS — Z7689 Persons encountering health services in other specified circumstances: Secondary | ICD-10-CM

## 2022-11-08 NOTE — Assessment & Plan Note (Addendum)
PHQ 9 and GAD-7 of 0 today.  History of anxiety, we discussed a referral to psychiatry for reevaluation and for them to recommend the best medical management options for her.  I will check in with her at her annual physical in 2 to 3 months to see if she has been able to see psychiatry and continue to monitor.

## 2022-11-08 NOTE — Patient Instructions (Signed)
Welcome to Primary Care at [facility], it was great to meet you today!

## 2022-11-08 NOTE — Assessment & Plan Note (Signed)
Followed by infectious disease through [facility] health.  Continue Descovy and Tivicay as recommended per ID.  Will continue to monitor, getting blood work done in 2 to 3 months for annual physical.

## 2022-11-08 NOTE — Assessment & Plan Note (Signed)
She has been clean for at least 1 year.  Her children really motivate her to stay clean.  Will continue to monitor and check in with her at each visit to see how things are going.

## 2022-12-04 ENCOUNTER — Ambulatory Visit (INDEPENDENT_AMBULATORY_CARE_PROVIDER_SITE_OTHER): Payer: Medicaid Other | Admitting: Dermatology

## 2022-12-04 VITALS — BP 108/78 | HR 67

## 2022-12-04 DIAGNOSIS — D492 Neoplasm of unspecified behavior of bone, soft tissue, and skin: Secondary | ICD-10-CM

## 2022-12-04 DIAGNOSIS — D2239 Melanocytic nevi of other parts of face: Secondary | ICD-10-CM | POA: Diagnosis not present

## 2022-12-04 NOTE — Progress Notes (Signed)
   New Patient Visit  Subjective  Nicole Morgan is a 27 y.o. female who presents for the following: Nevus (R preauricular, has had for years, color changing past year, getting lighter in color, no hx of skin cancer, no fhx of skin cancer).  New patient referral from [facility], [place].  The following portions of the chart were reviewed this encounter and updated as appropriate:   Tobacco  Allergies  Meds  Problems  Med Hx  Surg Hx  Fam Hx     Review of Systems:  No other skin or systemic complaints except as noted in HPI or Assessment and Plan.  Objective  Well appearing patient in no apparent distress; mood and affect are within normal limits.  A focused examination was performed including face. Relevant physical exam findings are noted in the Assessment and Plan.  R preauricular/sideburn 0.8cm flesh pap      Assessment & Plan  Neoplasm of skin R preauricular/sideburn  Epidermal / dermal shaving  Lesion diameter (cm):  0.8 Informed consent: discussed and consent obtained   Timeout: patient name, date of birth, surgical site, and procedure verified   Procedure prep:  Patient was prepped and draped in usual sterile fashion Prep type:  Isopropyl alcohol Anesthesia: the lesion was anesthetized in a standard fashion   Anesthetic:  1% lidocaine w/ epinephrine 1-100,000 buffered w/ 8.4% NaHCO3 Instrument used: flexible razor blade   Hemostasis achieved with: pressure, aluminum chloride and electrodesiccation   Outcome: patient tolerated procedure well   Post-procedure details: sterile dressing applied and wound care instructions given   Dressing type: bandage and bacitracin    Specimen 1 - Surgical pathology Differential Diagnosis: D48.5 Irritated Nevus r/o Atypia  Check Margins: yes 0.8cm flesh pap   Return if symptoms worsen or fail to improve.  I, Othelia Pulling, RMA, am acting as scribe for Sarina Ser, MD . Documentation: I have reviewed the above  documentation for accuracy and completeness, and I agree with the above.  Sarina Ser, MD

## 2022-12-04 NOTE — Patient Instructions (Addendum)
Wound Care Instructions  Cleanse wound gently with soap and water once a day then pat dry with clean gauze. Apply a thin coat of Petrolatum (petroleum jelly, "Vaseline") over the wound (unless you have an allergy to this). We recommend that you use a new, sterile tube of Vaseline. Do not pick or remove scabs. Do not remove the yellow or white "healing tissue" from the base of the wound.  Cover the wound with fresh, clean, nonstick gauze and secure with paper tape. You may use Band-Aids in place of gauze and tape if the wound is small enough, but would recommend trimming much of the tape off as there is often too much. Sometimes Band-Aids can irritate the skin.  You should call the office for your biopsy report after 1 week if you have not already been contacted.  If you experience any problems, such as abnormal amounts of bleeding, swelling, significant bruising, significant pain, or evidence of infection, please call the office immediately.  FOR ADULT SURGERY PATIENTS: If you need something for pain relief you may take 1 extra strength Tylenol (acetaminophen) AND 2 Ibuprofen (200mg each) together every 4 hours as needed for pain. (do not take these if you are allergic to them or if you have a reason you should not take them.) Typically, you may only need pain medication for 1 to 3 days.     Due to recent changes in healthcare laws, you may see results of your pathology and/or laboratory studies on MyChart before the doctors have had a chance to review them. We understand that in some cases there may be results that are confusing or concerning to you. Please understand that not all results are received at the same time and often the doctors may need to interpret multiple results in order to provide you with the best plan of care or course of treatment. Therefore, we ask that you please give us 2 business days to thoroughly review all your results before contacting the office for clarification. Should  we see a critical lab result, you will be contacted sooner.   If You Need Anything After Your Visit  If you have any questions or concerns for your doctor, please call our main line at [phone] and press option 4 to reach your doctor's medical assistant. If no one answers, please leave a voicemail as directed and we will return your call as soon as possible. Messages left after 4 pm will be answered the following business day.   You may also send us a message via MyChart. We typically respond to MyChart messages within 1-2 business days.  For prescription refills, please ask your pharmacy to contact our office. Our fax number is [phone].  If you have an urgent issue when the clinic is closed that cannot wait until the next business day, you can page your doctor at the number below.    Please note that while we do our best to be available for urgent issues outside of office hours, we are not available 24/7.   If you have an urgent issue and are unable to reach us, you may choose to seek medical care at your doctor's office, retail clinic, urgent care center, or emergency room.  If you have a medical emergency, please immediately call 911 or go to the emergency department.  Pager Numbers  - Dr. Kowalski: [phone]  - Dr. Moye: [phone]  - Dr. Stewart: [phone]  In the event of inclement weather, please call our main line at   [phone] for an update on the status of any delays or closures.  Dermatology Medication Tips: Please keep the boxes that topical medications come in in order to help keep track of the instructions about where and how to use these. Pharmacies typically print the medication instructions only on the boxes and not directly on the medication tubes.   If your medication is too expensive, please contact our office at [phone] option 4 or send us a message through MyChart.   We are unable to tell what your co-pay for medications will be in  advance as this is different depending on your insurance coverage. However, we may be able to find a substitute medication at lower cost or fill out paperwork to get insurance to cover a needed medication.   If a prior authorization is required to get your medication covered by your insurance company, please allow us 1-2 business days to complete this process.  Drug prices often vary depending on where the prescription is filled and some pharmacies may offer cheaper prices.  The website www.goodrx.com contains coupons for medications through different pharmacies. The prices here do not account for what the cost may be with help from insurance (it may be cheaper with your insurance), but the website can give you the price if you did not use any insurance.  - You can print the associated coupon and take it with your prescription to the pharmacy.  - You may also stop by our office during regular business hours and pick up a GoodRx coupon card.  - If you need your prescription sent electronically to a different pharmacy, notify our office through [place] MyChart or by phone at [phone] option 4.     Si Usted Necesita Algo Despus de Su Visita  Tambin puede enviarnos un mensaje a travs de MyChart. Por lo general respondemos a los mensajes de MyChart en el transcurso de 1 a 2 das hbiles.  Para renovar recetas, por favor pida a su farmacia que se ponga en contacto con nuestra oficina. Nuestro nmero de fax es el [phone].  Si tiene un asunto urgente cuando la clnica est cerrada y que no puede esperar hasta el siguiente da hbil, puede llamar/localizar a su doctor(a) al nmero que aparece a continuacin.   Por favor, tenga en cuenta que aunque hacemos todo lo posible para estar disponibles para asuntos urgentes fuera del horario de oficina, no estamos disponibles las 24 horas del da, los 7 das de la semana.   Si tiene un problema urgente y no puede comunicarse con nosotros, puede  optar por buscar atencin mdica  en el consultorio de su doctor(a), en una clnica privada, en un centro de atencin urgente o en una sala de emergencias.  Si tiene una emergencia mdica, por favor llame inmediatamente al 911 o vaya a la sala de emergencias.  Nmeros de bper  - Dr. Kowalski: [phone]  - Dra. Moye: [phone]  - Dra. Stewart: [phone]  En caso de inclemencias del tiempo, por favor llame a nuestra lnea principal al [phone] para una actualizacin sobre el estado de cualquier retraso o cierre.  Consejos para la medicacin en dermatologa: Por favor, guarde las cajas en las que vienen los medicamentos de uso tpico para ayudarle a seguir las instrucciones sobre dnde y cmo usarlos. Las farmacias generalmente imprimen las instrucciones del medicamento slo en las cajas y no directamente en los tubos del medicamento.   Si su medicamento es muy caro, por favor, pngase en contacto con   nuestra oficina llamando al [phone] y presione la opcin 4 o envenos un mensaje a travs de MyChart.   No podemos decirle cul ser su copago por los medicamentos por adelantado ya que esto es diferente dependiendo de la cobertura de su seguro. Sin embargo, es posible que podamos encontrar un medicamento sustituto a menor costo o llenar un formulario para que el seguro cubra el medicamento que se considera necesario.   Si se requiere una autorizacin previa para que su compaa de seguros cubra su medicamento, por favor permtanos de 1 a 2 das hbiles para completar este proceso.  Los precios de los medicamentos varan con frecuencia dependiendo del lugar de dnde se surte la receta y alguna farmacias pueden ofrecer precios ms baratos.  El sitio web www.goodrx.com tiene cupones para medicamentos de diferentes farmacias. Los precios aqu no tienen en cuenta lo que podra costar con la ayuda del seguro (puede ser ms barato con su seguro), pero el sitio web puede darle el  precio si no utiliz ningn seguro.  - Puede imprimir el cupn correspondiente y llevarlo con su receta a la farmacia.  - Tambin puede pasar por nuestra oficina durante el horario de atencin regular y recoger una tarjeta de cupones de GoodRx.  - Si necesita que su receta se enve electrnicamente a una farmacia diferente, informe a nuestra oficina a travs de MyChart de [place] o por telfono llamando al [phone] y presione la opcin 4.  

## 2022-12-06 ENCOUNTER — Encounter: Payer: Self-pay | Admitting: Dermatology

## 2022-12-09 ENCOUNTER — Other Ambulatory Visit: Payer: Medicaid Other

## 2022-12-09 ENCOUNTER — Other Ambulatory Visit: Payer: Self-pay

## 2022-12-09 DIAGNOSIS — Z21 Asymptomatic human immunodeficiency virus [HIV] infection status: Secondary | ICD-10-CM

## 2022-12-11 ENCOUNTER — Telehealth: Payer: Self-pay

## 2022-12-11 NOTE — Telephone Encounter (Signed)
Left message advising patient biopsy was a benign nevus.

## 2022-12-11 NOTE — Telephone Encounter (Signed)
-----   Message from Brendolyn Patty, MD sent at 12/11/2022 10:47 AM EDT ----- Skin , right preauricular/sideburn MELANOCYTIC NEVUS, INTRADERMAL TYPE, IRRITATED, DEEP MARGIN INVOLVED  Benign irritated mole, may recur - please call patient

## 2022-12-12 LAB — HIV-1 RNA QUANT-NO REFLEX-BLD
HIV 1 RNA Quant: NOT DETECTED Copies/mL
HIV-1 RNA Quant, Log: NOT DETECTED Log cps/mL

## 2022-12-24 ENCOUNTER — Ambulatory Visit (INDEPENDENT_AMBULATORY_CARE_PROVIDER_SITE_OTHER): Payer: Medicaid Other

## 2022-12-24 ENCOUNTER — Encounter (HOSPITAL_COMMUNITY): Payer: Self-pay | Admitting: Emergency Medicine

## 2022-12-24 ENCOUNTER — Ambulatory Visit (HOSPITAL_COMMUNITY)
Admission: EM | Admit: 2022-12-24 | Discharge: 2022-12-24 | Disposition: A | Discharge status: Home / Self Care | Payer: Medicaid Other | Attending: Internal Medicine | Admitting: Internal Medicine

## 2022-12-24 DIAGNOSIS — W19XXXA Unspecified fall, initial encounter: Secondary | ICD-10-CM

## 2022-12-24 DIAGNOSIS — S63501A Unspecified sprain of right wrist, initial encounter: Secondary | ICD-10-CM | POA: Diagnosis not present

## 2022-12-24 DIAGNOSIS — M79641 Pain in right hand: Secondary | ICD-10-CM | POA: Diagnosis not present

## 2022-12-24 MED ORDER — IBUPROFEN 800 MG PO TABS
ORAL_TABLET | ORAL | Status: AC
  Filled 2022-12-24: qty 1

## 2022-12-24 MED ORDER — IBUPROFEN 800 MG PO TABS
800.0000 mg | ORAL_TABLET | Freq: Once | ORAL | Status: AC
  Administered 2022-12-24: 800 mg via ORAL

## 2022-12-24 NOTE — ED Provider Notes (Signed)
[place]    CSN: [id number] Arrival date & time: 12/24/22  1559      History   Chief Complaint Chief Complaint  Patient presents with   Wrist Pain    HPI Nicole Morgan is a 27 y.o. female.   Patient presents to urgent care for evaluation of right wrist pain after she tripped over her shoe today while holding her 86-month-old baby subsequently causing her to fall.  She was able to catch herself using her right wrist/right hand without dropping her baby.  She denies numbness and tingling distally to area of greatest tenderness.  She denies preceding dizziness, nausea, vomiting, shortness of breath, chest pain, or heart palpitations.  Denies previous injury to the right wrist in the past.  She did not hit her head and denies LOC.  Currently complaining of significant tenderness to the ulnar aspect of the right wrist.  Pain is worse with movement.  She has not attempted use of any over-the-counter medications to help with pain before coming to urgent care.  Injury happened approximately 8 hours prior to arrival urgent care.  No laceration/abrasion.   Wrist Pain    Past Medical History:  Diagnosis Date   Anxiety 2020   Bipolar 1 disorder 2020   Cystic fibrosis carrier    Depression    no meds   Eczema    HIV (human immunodeficiency virus infection) 04/2022   Incompetent cervix    Insomnia 2020   PTSD (post-traumatic stress disorder) 2020   Substance abuse    alcohol, cocaine, marijuana   UTI (urinary tract infection)     Patient Active Problem List   Diagnosis Date Noted   GAD (generalized anxiety disorder) 11/08/2022   PTSD (post-traumatic stress disorder) 11/08/2022   Depression, recurrent 11/08/2022   Encounter for female sterilization procedure 10/08/2022   History of cervical incompetence in pregnancy, currently pregnant, second trimester 05/14/2022   HIV (human immunodeficiency virus infection) 05/01/2022   Cystic fibrosis carrier 04/09/2022    Polysubstance abuse 04/08/2021   Bipolar 1 disorder 04/08/2021    Past Surgical History:  Procedure Laterality Date   CERVICAL CERCLAGE  06/06/2012   Procedure: CERCLAGE CERVICAL;  Surgeon: Melina Schools, MD;  Location: [facility];  Service: Gynecology;  Laterality: N/A;   LAPAROSCOPIC BILATERAL SALPINGECTOMY Bilateral 10/08/2022   Procedure: LAPAROSCOPIC BILATERAL SALPINGECTOMY;  Surgeon: Griffin Basil, MD;  Location: [facility];  Service: Gynecology;  Laterality: Bilateral;   PLACEMENT OF BREAST IMPLANTS     WISDOM TOOTH EXTRACTION      OB History     Gravida  2   Para  2   Term  2   Preterm  0   AB  0   Living  2      SAB  0   IAB  0   Ectopic  0   Multiple  0   Live Births  2            Home Medications    Prior to Admission medications   Medication Sig Start Date End Date Taking? Authorizing Provider  dolutegravir-lamiVUDine (DOVATO) 50-300 MG tablet Take 1 tablet by mouth daily. 11/04/22   Williamson Callas, NP  ibuprofen (ADVIL) 600 MG tablet Take 1 tablet (600 mg total) by mouth every 6 (six) hours as needed for headache, mild pain, moderate pain or cramping. 10/08/22   Griffin Basil, MD  oxyCODONE (OXY IR/ROXICODONE) 5 MG immediate release tablet Take 1 tablet (5 mg  total) by mouth every 6 (six) hours as needed for severe pain or breakthrough pain. 10/08/22   Griffin Basil, MD    Family History Family History  Problem Relation Age of Onset   Bipolar disorder Mother        schizophrenic   Mental illness Mother        schizophrenia   Deep vein thrombosis Maternal Aunt    Alcohol abuse Maternal Grandmother    Mental illness Maternal Grandmother    Bipolar disorder Maternal Grandmother        schizophrenic    Cancer Maternal Grandmother        lymph nodes   Migraines Neg Hx    Headache Neg Hx     Social History Social History   Tobacco Use   Smoking status: Every Day    Types: E-cigarettes    Last attempt to quit: 04/2022    Years  since quitting: 0.6    Passive exposure: Current   Smokeless tobacco: Never  Vaping Use   Vaping Use: Every day   Substances: Nicotine  Substance Use Topics   Alcohol use: Not Currently   Drug use: Not Currently    Frequency: 1.0 times per week    Types: Marijuana    Comment: last day 09/26/22-     Allergies   Patient has no known allergies.   Review of Systems Review of Systems Per HPI  Physical Exam Triage Vital Signs ED Triage Vitals  Enc Vitals Group     BP 12/24/22 1743 117/83     Pulse Rate 12/24/22 1743 83     Resp 12/24/22 1743 15     Temp 12/24/22 1743 98.5 F (36.9 C)     Temp Source 12/24/22 1743 Oral     SpO2 12/24/22 1743 98 %     Weight --      Height --      Head Circumference --      Peak Flow --      Pain Score 12/24/22 1742 8     Pain Loc --      Pain Edu? --      Excl. in [place]? --    No data found.  Updated Vital Signs BP 117/83 (BP Location: Left Arm)   Pulse 83   Temp 98.5 F (36.9 C) (Oral)   Resp 15   LMP 12/23/2022   SpO2 98%   Visual Acuity Right Eye Distance:   Left Eye Distance:   Bilateral Distance:    Right Eye Near:   Left Eye Near:    Bilateral Near:     Physical Exam Vitals and nursing note reviewed.  Constitutional:      Appearance: She is not ill-appearing or toxic-appearing.  HENT:     Head: Normocephalic and atraumatic.     Right Ear: Hearing and external ear normal.     Left Ear: Hearing and external ear normal.     Nose: Nose normal.     Mouth/Throat:     Lips: Pink.  Eyes:     General: Lids are normal. Vision grossly intact. Gaze aligned appropriately.     Extraocular Movements: Extraocular movements intact.     Conjunctiva/sclera: Conjunctivae normal.  Pulmonary:     Effort: Pulmonary effort is normal.  Musculoskeletal:     Right forearm: Normal.     Left forearm: Normal.     Right wrist: Tenderness (Generalized tenderness to the ulnar aspect of the right wrist.) present. No swelling,  deformity,  effusion, lacerations, bony tenderness, snuff box tenderness or crepitus. Decreased range of motion. Normal pulse.     Left wrist: Normal.     Right hand: Normal.     Left hand: Normal.     Cervical back: Neck supple.     Comments: +2 bilateral radial pulses.  Strength and sensation intact to bilateral upper extremities.  Capillary refill distally is less than 3.  Skin:    General: Skin is warm and dry.     Capillary Refill: Capillary refill takes less than 2 seconds.     Findings: No rash.  Neurological:     General: No focal deficit present.     Mental Status: She is alert and oriented to person, place, and time. Mental status is at baseline.     Cranial Nerves: No dysarthria or facial asymmetry.  Psychiatric:        Mood and Affect: Mood normal.        Speech: Speech normal.        Behavior: Behavior normal.        Thought Content: Thought content normal.        Judgment: Judgment normal.      UC Treatments / Results  Labs (all labs ordered are listed, but only abnormal results are displayed) Labs Reviewed - No data to display  EKG   Radiology DG Hand Complete Right  Result Date: 12/24/2022 CLINICAL DATA:  Fall onto outstretched 10 EXAM: RIGHT HAND - COMPLETE 3+ VIEW COMPARISON:  None Available. FINDINGS: There is no evidence of fracture or dislocation. There is no evidence of arthropathy or other focal bone abnormality. Soft tissues are unremarkable. IMPRESSION: Negative. Electronically Signed   By: Donavan Foil M.D.   On: 12/24/2022 18:22    Procedures Procedures (including critical care time)  Medications Ordered in UC Medications  ibuprofen (ADVIL) tablet 800 mg (has no administration in time range)    Initial Impression / Assessment and Plan / UC Course  I have reviewed the triage vital signs and the nursing notes.  Pertinent labs & imaging results that were available during my care of the patient were reviewed by me and considered in my medical decision making  (see chart for details).   1.  Sprain of right wrist, fall X-rays are negative for acute bony abnormality.  Will manage this as an acute sprain.  Neurovascularly intact distally to injury.  Wrist brace applied.  800 mg ibuprofen may be used every 8 hours as needed for pain and discomfort.  Walking referral to orthopedics provided should symptoms fail to improve in the next 5 to 7 days.  Work note given.  RICE advised.  Discussed physical exam and available lab work findings in clinic with patient.  Counseled patient regarding appropriate use of medications and potential side effects for all medications recommended or prescribed today. Discussed red flag signs and symptoms of worsening condition,when to call the PCP office, return to urgent care, and when to seek higher level of care in the emergency department. Patient verbalizes understanding and agreement with plan. All questions answered. Patient discharged in stable condition.    Final Clinical Impressions(s) / UC Diagnoses   Final diagnoses:  Sprain of right wrist, initial encounter  Fall, initial encounter     Discharge Instructions      Your x-rays of your wrist were negative for fracture or dislocation. You likely sprained your wrist.   Wear the wrist brace we provided in the clinic for the  next couple of weeks to provide compression, stability, and comfort.  Please rest, ice, and elevate your wrist to help it heal and decrease inflammation.   Take ibuprofen 800 mg every 8 hours as needed for inflammation and pain.  Take this medicine with food to avoid stomach upset.  Call the orthopedic provider listed on your discharge paperwork to schedule a follow-up appointment if your symptoms do not improve in the next 1-2 weeks with supportive care.  Return to urgent care if you experience worsening pain, numbness, tingling, change of color in your skin near the injury, or any other concerning symptoms.  I hope you feel  better!     ED Prescriptions   None    PDMP not reviewed this encounter.   Talbot Grumbling, [place] 12/24/22 1850

## 2022-12-24 NOTE — Discharge Instructions (Signed)
Your x-rays of your wrist were negative for fracture or dislocation. You likely sprained your wrist.   Wear the wrist brace we provided in the clinic for the next couple of weeks to provide compression, stability, and comfort.  Please rest, ice, and elevate your wrist to help it heal and decrease inflammation.   Take ibuprofen 800 mg every 8 hours as needed for inflammation and pain.  Take this medicine with food to avoid stomach upset.  Call the orthopedic provider listed on your discharge paperwork to schedule a follow-up appointment if your symptoms do not improve in the next 1-2 weeks with supportive care.  Return to urgent care if you experience worsening pain, numbness, tingling, change of color in your skin near the injury, or any other concerning symptoms.  I hope you feel better!

## 2022-12-24 NOTE — ED Triage Notes (Signed)
Pt fell earlier with baby in arms and tried catching self with right hand. C/o right wrist pain and hand pain esp with rotation.

## 2023-01-01 ENCOUNTER — Other Ambulatory Visit: Payer: Medicaid Other

## 2023-01-08 ENCOUNTER — Encounter: Payer: Medicaid Other | Admitting: Family Medicine

## 2023-01-08 NOTE — Progress Notes (Deleted)
Complete physical exam  Patient: Nicole Morgan   DOB: [birth year]   26 y.o. Female  MRN: [id number]  Subjective:    No chief complaint on file.   Nicole Morgan is a 27 y.o. female who presents today for a complete physical exam. She reports consuming a {diet types:17450} diet. {types:19826} She generally feels {DESC; WELL/FAIRLY WELL/POORLY:18703}. She reports sleeping {DESC; WELL/FAIRLY WELL/POORLY:18703}. She {does/does not:200015} have additional problems to discuss today.    Most recent fall risk assessment:    11/08/2022   11:27 AM  Fall Risk   Falls in the past year? 1  Number falls in past yr: 1  Injury with Fall? 0     Most recent depression screenings:    11/08/2022   11:26 AM 11/04/2022    8:38 AM  PHQ 2/9 Scores  PHQ - 2 Score 0 0  PHQ- 9 Score 0     Patient Active Problem List   Diagnosis Date Noted   GAD (generalized anxiety disorder) 11/08/2022   PTSD (post-traumatic stress disorder) 11/08/2022   Depression, recurrent 11/08/2022   Encounter for female sterilization procedure 10/08/2022   History of cervical incompetence in pregnancy, currently pregnant, second trimester 05/14/2022   HIV (human immunodeficiency virus infection) 05/01/2022   Cystic fibrosis carrier 04/09/2022   Polysubstance abuse 04/08/2021   Bipolar 1 disorder 04/08/2021    Past Surgical History:  Procedure Laterality Date   CERVICAL CERCLAGE  06/06/2012   Procedure: CERCLAGE CERVICAL;  Surgeon: Bing Plume, MD;  Location: [facility];  Service: Gynecology;  Laterality: N/A;   LAPAROSCOPIC BILATERAL SALPINGECTOMY Bilateral 10/08/2022   Procedure: LAPAROSCOPIC BILATERAL SALPINGECTOMY;  Surgeon: Warden Fillers, MD;  Location: [facility];  Service: Gynecology;  Laterality: Bilateral;   PLACEMENT OF BREAST IMPLANTS     WISDOM TOOTH EXTRACTION     Social History   Tobacco Use   Smoking status: Every Day    Types: E-cigarettes    Last attempt to quit: 04/2022    Years since quitting: 0.7     Passive exposure: Current   Smokeless tobacco: Never  Vaping Use   Vaping Use: Every day   Substances: Nicotine  Substance Use Topics   Alcohol use: Not Currently   Drug use: Not Currently    Frequency: 1.0 times per week    Types: Marijuana    Comment: last day 09/26/22-   Family History  Problem Relation Age of Onset   Bipolar disorder Mother        schizophrenic   Mental illness Mother        schizophrenia   Deep vein thrombosis Maternal Aunt    Alcohol abuse Maternal Grandmother    Mental illness Maternal Grandmother    Bipolar disorder Maternal Grandmother        schizophrenic    Cancer Maternal Grandmother        lymph nodes   Migraines Neg Hx    Headache Neg Hx    No Known Allergies   Patient Care Team: Melida Quitter, PA as PCP - General (Family Medicine) Marny Lowenstein, PA-C as PCP - OBGYN (Obstetrics and Gynecology)   Outpatient Medications Prior to Visit  Medication Sig   dolutegravir-lamiVUDine (DOVATO) 50-300 MG tablet Take 1 tablet by mouth daily.   ibuprofen (ADVIL) 600 MG tablet Take 1 tablet (600 mg total) by mouth every 6 (six) hours as needed for headache, mild pain, moderate pain or cramping.   oxyCODONE (OXY IR/ROXICODONE) 5 MG immediate release  tablet Take 1 tablet (5 mg total) by mouth every 6 (six) hours as needed for severe pain or breakthrough pain.   No facility-administered medications prior to visit.    ROS     Objective:    LMP 12/23/2022    Physical Exam   No results found for any visits on 01/08/23.     Assessment & Plan:    Routine Health Maintenance and Physical Exam  Immunization History  Administered Date(s) Administered   Influenza,inj,Quad PF,6+ Mos 06/07/2022   Influenza-Unspecified 06/01/2018   MMR 10/18/2012, 07/26/2022   PPD Test 06/01/2018   Respiratory Syncytial Virus Vaccine,Recomb Aduvanted(Arexvy) 07/15/2022   Rho (D) Immune Globulin 10/18/2012   Tdap 05/14/2022    Health Maintenance  Topic  Date Due   INFLUENZA VACCINE  04/24/2023   PAP-Cervical Cytology Screening  02/28/2025   PAP SMEAR-Modifier  02/28/2025   DTaP/Tdap/Td (2 - Td or Tdap) 05/14/2032   Hepatitis C Screening  Completed   HIV Screening  Completed   HPV VACCINES  Aged Out   COVID-19 Vaccine  Discontinued    Discussed health benefits of physical activity, and encouraged her to engage in regular exercise appropriate for her age and condition.  There are no diagnoses linked to this encounter.  No follow-ups on file.     Melida Quitter, PA

## 2023-03-26 ENCOUNTER — Telehealth: Payer: Self-pay | Admitting: *Deleted

## 2023-03-26 NOTE — Telephone Encounter (Addendum)
LVM for pt to call office.  Returned call that was left on VM.

## 2023-04-10 ENCOUNTER — Other Ambulatory Visit: Payer: Self-pay

## 2023-04-10 ENCOUNTER — Ambulatory Visit: Payer: Medicaid Other

## 2023-04-29 ENCOUNTER — Other Ambulatory Visit (HOSPITAL_COMMUNITY): Payer: Self-pay

## 2023-04-29 ENCOUNTER — Other Ambulatory Visit: Payer: Self-pay

## 2023-04-29 MED ORDER — DOVATO 50-300 MG PO TABS
1.0000 | ORAL_TABLET | Freq: Every day | ORAL | 5 refills | Status: DC
  Filled 2023-04-29 – 2023-05-01 (×2): qty 30, 30d supply, fill #0

## 2023-05-01 ENCOUNTER — Other Ambulatory Visit (HOSPITAL_COMMUNITY): Payer: Self-pay

## 2023-05-02 ENCOUNTER — Other Ambulatory Visit (HOSPITAL_COMMUNITY): Payer: Self-pay

## 2023-05-19 ENCOUNTER — Other Ambulatory Visit (HOSPITAL_COMMUNITY)
Admission: RE | Admit: 2023-05-19 | Discharge: 2023-05-19 | Disposition: A | Discharge status: Home / Self Care | Payer: BC Managed Care – PPO | Source: Ambulatory Visit | Attending: Infectious Diseases | Admitting: Infectious Diseases

## 2023-05-19 ENCOUNTER — Other Ambulatory Visit: Payer: Self-pay

## 2023-05-19 ENCOUNTER — Encounter: Payer: Self-pay | Admitting: Infectious Diseases

## 2023-05-19 ENCOUNTER — Ambulatory Visit (INDEPENDENT_AMBULATORY_CARE_PROVIDER_SITE_OTHER): Payer: BC Managed Care – PPO | Admitting: Infectious Diseases

## 2023-05-19 ENCOUNTER — Other Ambulatory Visit (HOSPITAL_COMMUNITY): Payer: Self-pay

## 2023-05-19 VITALS — BP 118/79 | HR 84 | Temp 97.8°F | Ht 61.5 in | Wt 116.0 lb

## 2023-05-19 DIAGNOSIS — K625 Hemorrhage of anus and rectum: Secondary | ICD-10-CM | POA: Insufficient documentation

## 2023-05-19 DIAGNOSIS — Z87898 Personal history of other specified conditions: Secondary | ICD-10-CM | POA: Diagnosis not present

## 2023-05-19 DIAGNOSIS — R200 Anesthesia of skin: Secondary | ICD-10-CM

## 2023-05-19 DIAGNOSIS — B20 Human immunodeficiency virus [HIV] disease: Secondary | ICD-10-CM

## 2023-05-19 DIAGNOSIS — Z21 Asymptomatic human immunodeficiency virus [HIV] infection status: Secondary | ICD-10-CM

## 2023-05-19 DIAGNOSIS — R634 Abnormal weight loss: Secondary | ICD-10-CM | POA: Diagnosis not present

## 2023-05-19 MED ORDER — DOVATO 50-300 MG PO TABS
1.0000 | ORAL_TABLET | Freq: Every day | ORAL | 5 refills | Status: DC
  Filled 2023-05-19 – 2023-05-22 (×2): qty 30, 30d supply, fill #0
  Filled 2023-06-10: qty 30, 30d supply, fill #1
  Filled 2023-07-09: qty 30, 30d supply, fill #2
  Filled 2023-07-31: qty 30, 30d supply, fill #3
  Filled 2023-09-09: qty 30, 30d supply, fill #4
  Filled 2023-09-24: qty 30, 30d supply, fill #5

## 2023-05-19 MED ORDER — LIDOCAINE VISCOUS HCL 2 % MT SOLN
15.0000 mL | OROMUCOSAL | 0 refills | Status: DC | PRN
  Filled 2023-05-19: qty 100, 7d supply, fill #0

## 2023-05-19 NOTE — Patient Instructions (Addendum)
Nice to see you!   Please call [facility] @ [phone] to schedule an urgent dental appointment - we will have you fill out a form for this today    Lidocaine has been refilled   For the pain - I would consider some ice packs on the side of your cheek. Ibuprofen 3-4 over the counter tabs every 8 hours with food. This is cheapest at [facility] long pharmacy if you need more.   Refills for your dovato have been sent in

## 2023-05-19 NOTE — Progress Notes (Unsigned)
Name: Nicole Morgan  DOB: [birth year] MRN: [id number] PCP: Nicole Quitter, PA     Brief Narrative:  Nicole Morgan is a 27 y.o. female with HIV diagnosis 04/29/22 from [facility] as part of contact tracing event HIV Risk: sexual History of OIs:  Intake Labs: Hep B sAg (-), sAb (-), cAb (); Hep A (), Hep C (-) Quantiferon (-)   Previous Regimens: Tiv[place]ay + Descovy 04-2022  Genotypes: Not collected.   Subjective:   Chief Complaint  Patient presents with   Follow-up    B20      HPI: Nicole Morgan is here for follow up. Labor induction at [redacted]w[redacted]d for oligohydramnios with vaginal delivery of a daughter on 07/25/2022. Elected for BTL in 10/08/2022 for contraception management.   She has had a right upper dental infection. Completed a course of oral antibiotics.  She has a lot of pain in the right upper jaw - tough with biting down hard. Has had little to eat because of this. Has had some weightloss - has previously had overactive thyroid before but not on medicaitions for this in the past.  Has had trouble with cocaine before but nothing for 2 years.  Has had some softer more frequent bowel movements. No family history  Sees some red - maybe blood - in the stool for the last year or so. Sometimes dark red. Does have some blood when she wipes. To some degree.    Wt Readings from Last 3 Encounters:  05/19/23 116 lb (52.6 kg)  11/08/22 133 lb (60.3 kg)  11/04/22 134 lb (60.8 kg)        05/19/2023    9:28 AM  Depression screen PHQ 2/9  Down, Depressed, Hopeless 1  PHQ - 2 Score 1     Review of Systems  Constitutional:  Negative for chills, fever, malaise/fatigue and weight loss.  HENT:  Negative for sore throat.   Respiratory:  Negative for cough, sputum production and shortness of breath.   Cardiovascular: Negative.   Gastrointestinal:  Negative for abdominal pain, diarrhea and vomiting.  Musculoskeletal:  Negative for joint pain, myalgias and neck pain.  Skin:  Negative for  rash.  Neurological:  Negative for headaches.  Psychiatric/Behavioral:  Negative for depression and substance abuse. The patient is not nervous/anxious.      Past Medical History:  Diagnosis Date   Anxiety 2020   Bipolar 1 disorder (HCC) 2020   Cystic fibrosis carrier    Depression    no meds   Eczema    HIV (human immunodeficiency virus infection) (HCC) 04/2022   Incompetent cervix    Insomnia 2020   PTSD (post-traumatic stress disorder) 2020   Substance abuse (HCC)    alcohol, cocaine, marijuana   UTI (urinary tract infection)     Outpatient Medications Prior to Visit  Medication Sig Dispense Refill   dolutegravir-lamiVUDine (DOVATO) 50-300 MG tablet Take 1 tablet by mouth daily. 30 tablet 5   ibuprofen (ADVIL) 600 MG tablet Take 1 tablet (600 mg total) by mouth every 6 (six) hours as needed for headache, mild pain, moderate pain or cramping. (Patient not taking: Reported on 05/19/2023) 30 tablet 2   oxyCODONE (OXY IR/ROXICODONE) 5 MG immediate release tablet Take 1 tablet (5 mg total) by mouth every 6 (six) hours as needed for severe pain or breakthrough pain. (Patient not taking: Reported on 05/19/2023) 24 tablet 0   No facility-administered medications prior to visit.     No Known Allergies  Social  History   Tobacco Use   Smoking status: Every Day    Types: E-cigarettes    Last attempt to quit: 04/2022    Years since quitting: 1.0    Passive exposure: Current   Smokeless tobacco: Never  Vaping Use   Vaping status: Every Day   Substances: Nicotine  Substance Use Topics   Alcohol use: Not Currently   Drug use: Not Currently    Frequency: 1.0 times per week    Types: Marijuana    Comment: last day 09/26/22-    Family History  Problem Relation Age of Onset   Bipolar disorder Mother        schizophrenic   Mental illness Mother        schizophrenia   Deep vein thrombosis Maternal Aunt    Alcohol abuse Maternal Grandmother    Mental illness Maternal Grandmother     Bipolar disorder Maternal Grandmother        schizophrenic    Cancer Maternal Grandmother        lymph nodes   Migraines Neg Hx    Headache Neg Hx     Social History   Substance and Sexual Activity  Sexual Activity Not Currently     Objective:   Vitals:   05/19/23 0923  BP: 118/79  Pulse: 84  Temp: 97.8 F (36.6 C)  TempSrc: Oral  SpO2: 99%  Weight: 116 lb (52.6 kg)  Height: 5' 1.5" (1.562 m)   Body mass index is 21.56 kg/m.  Physical Exam Constitutional:      Appearance: Normal appearance. She is not ill-appearing.  HENT:     Mouth/Throat:     Mouth: Mucous membranes are moist.     Pharynx: Oropharynx is clear.  Eyes:     General: No scleral icterus. Cardiovascular:     Rate and Rhythm: Normal rate and regular rhythm.  Pulmonary:     Effort: Pulmonary effort is normal.  Neurological:     Mental Status: She is oriented to person, place, and time.  Psychiatric:        Mood and Affect: Mood normal.        Thought Content: Thought content normal.     Lab Results Lab Results  Component Value Date   WBC 6.0 10/08/2022   HGB 12.7 10/08/2022   HCT 37.6 10/08/2022   MCV 90.6 10/08/2022   PLT 356 10/08/2022    Lab Results  Component Value Date   CREATININE 0.77 10/08/2022   BUN 5 (L) 10/08/2022   NA 140 10/08/2022   K 3.8 10/08/2022   CL 107 10/08/2022   CO2 25 10/08/2022    Lab Results  Component Value Date   ALT 16 10/08/2022   AST 18 10/08/2022   ALKPHOS 69 10/08/2022   BILITOT 0.4 10/08/2022    No results found for: "CHOL", "HDL", "LDLCALC", "LDLDIRECT", "TRIG", "CHOLHDL" HIV 1 RNA Quant  Date Value  12/09/2022 Not Detected Copies/mL  11/04/2022 Not Detected Copies/mL  07/24/2022 <20 copies/mL   CD4 T Cell Abs (/uL)  Date Value  11/04/2022 1,148  05/14/2022 1,287     Assessment & Plan:   Problem List Items Addressed This Visit   None    Rexene Alberts, MSN, NP-C [facility] Pager: [phone] Office: [phone]  05/19/23  9:37 AM

## 2023-05-20 DIAGNOSIS — K625 Hemorrhage of anus and rectum: Secondary | ICD-10-CM | POA: Insufficient documentation

## 2023-05-20 DIAGNOSIS — R634 Abnormal weight loss: Secondary | ICD-10-CM | POA: Insufficient documentation

## 2023-05-20 LAB — T-HELPER CELLS (CD4) COUNT (NOT AT ARMC)
CD4 % Helper T Cell: 56 % (ref 33–65)
CD4 T Cell Abs: 890 /uL (ref 400–1790)

## 2023-05-20 NOTE — Assessment & Plan Note (Signed)
Iron labs today. Rectal GC/CT swab self collected).  Likely referral to GI for further evaluation.

## 2023-05-20 NOTE — Assessment & Plan Note (Signed)
2 years clean from cocaine - she uses THC occasionally for stress / sleep management but not to the point where it is disrupting her daily cares / responsibilities.

## 2023-05-20 NOTE — Assessment & Plan Note (Addendum)
Very well controlled on once daily Dovato. No concerns with access or adherence to medication. They are tolerating the medication well without side effects. No drug interactions identified. Pertinent lab tests ordered today.  No changes to insurance coverage.  Urgent referral to dental team here.  Would like to see psychiatry formally for re-evaluation off substances, I think this is a good idea for her to stay on top of things.  Sexual health and family planning discussed - pap smear due 2026.  Vaccines updated today - see health maintenance section.    Return in about 4 months (around 09/18/2023).

## 2023-05-20 NOTE — Assessment & Plan Note (Signed)
I would not suspect this is related to HIV infection as she has had excellent response to treatment with good virologic control and immunologic recovery.  Will check basic metabolic labs to see if we can find a cause.  With concern over blood in stool and weight loss she would benefit from GI referral and consideration of colonoscopy - she will talk with her primary team during the visit later this week.

## 2023-05-21 LAB — COMPLETE METABOLIC PANEL WITH GFR
AG Ratio: 1.5 (calc) (ref 1.0–2.5)
ALT: 9 U/L (ref 6–29)
AST: 12 U/L (ref 10–30)
Albumin: 4.1 g/dL (ref 3.6–5.1)
Alkaline phosphatase (APISO): 66 U/L (ref 31–125)
BUN/Creatinine Ratio: 9 (calc) (ref 6–22)
BUN: 6 mg/dL — ABNORMAL LOW (ref 7–25)
CO2: 28 mmol/L (ref 20–32)
Calcium: 9.5 mg/dL (ref 8.6–10.2)
Chloride: 105 mmol/L (ref 98–110)
Creat: 0.68 mg/dL (ref 0.50–0.96)
Globulin: 2.7 g/dL (ref 1.9–3.7)
Glucose, Bld: 97 mg/dL (ref 65–99)
Potassium: 4.2 mmol/L (ref 3.5–5.3)
Sodium: 139 mmol/L (ref 135–146)
Total Bilirubin: 0.5 mg/dL (ref 0.2–1.2)
Total Protein: 6.8 g/dL (ref 6.1–8.1)
eGFR: 121 mL/min/{1.73_m2} (ref >=60)

## 2023-05-21 LAB — CBC
HCT: 38.8 % (ref 35.0–45.0)
Hemoglobin: 13.1 g/dL (ref 11.7–15.5)
MCH: 31.3 pg (ref 27.0–33.0)
MCHC: 33.8 g/dL (ref 32.0–36.0)
MCV: 92.6 fL (ref 80.0–100.0)
MPV: 11.2 fL (ref 7.5–12.5)
Platelets: 415 10*3/uL — ABNORMAL HIGH (ref 140–400)
RBC: 4.19 10*6/uL (ref 3.80–5.10)
RDW: 12.9 % (ref 11.0–15.0)
WBC: 4.8 10*3/uL (ref 3.8–10.8)

## 2023-05-21 LAB — CYTOLOGY, (ORAL, ANAL, URETHRAL) ANCILLARY ONLY
Chlamydia: NEGATIVE
Comment: NEGATIVE
Comment: NORMAL
Neisseria Gonorrhea: NEGATIVE

## 2023-05-21 LAB — HIV-1 RNA QUANT-NO REFLEX-BLD
HIV 1 RNA Quant: NOT DETECTED {copies}/mL
HIV-1 RNA Quant, Log: NOT DETECTED {Log_copies}/mL

## 2023-05-21 LAB — LIPID PANEL
Cholesterol: 131 mg/dL (ref <200)
HDL: 58 mg/dL (ref >=50)
LDL Cholesterol (Calc): 59 mg/dL
Non-HDL Cholesterol (Calc): 73 mg/dL (ref <130)
Total CHOL/HDL Ratio: 2.3 (calc) (ref <5.0)
Triglycerides: 60 mg/dL (ref <150)

## 2023-05-21 LAB — HEMOGLOBIN A1C
Hgb A1c MFr Bld: 5.4 %{Hb} (ref <5.7)
Mean Plasma Glucose: 108 mg/dL
eAG (mmol/L): 6 mmol/L

## 2023-05-21 LAB — IRON,TIBC AND FERRITIN PANEL
%SAT: 24 % (ref 16–45)
Ferritin: 18 ng/mL (ref 16–154)
Iron: 74 ug/dL (ref 40–190)
TIBC: 306 ug/dL (ref 250–450)

## 2023-05-21 LAB — B12 AND FOLATE PANEL
Folate: 9 ng/mL
Vitamin B-12: 478 pg/mL (ref 200–1100)

## 2023-05-21 LAB — VITAMIN D 25 HYDROXY (VIT D DEFICIENCY, FRACTURES): Vit D, 25-Hydroxy: 44 ng/mL (ref 30–100)

## 2023-05-21 LAB — TSH+FREE T4: TSH W/REFLEX TO FT4: 0.87 m[IU]/L

## 2023-05-22 ENCOUNTER — Other Ambulatory Visit: Payer: Medicaid Other

## 2023-05-22 ENCOUNTER — Other Ambulatory Visit: Payer: Self-pay

## 2023-05-22 ENCOUNTER — Other Ambulatory Visit (HOSPITAL_COMMUNITY): Payer: Self-pay

## 2023-05-22 DIAGNOSIS — Z13220 Encounter for screening for lipoid disorders: Secondary | ICD-10-CM

## 2023-05-22 DIAGNOSIS — K047 Periapical abscess without sinus: Secondary | ICD-10-CM

## 2023-05-22 DIAGNOSIS — Z0000 Encounter for general adult medical examination without abnormal findings: Secondary | ICD-10-CM

## 2023-05-22 DIAGNOSIS — K625 Hemorrhage of anus and rectum: Secondary | ICD-10-CM

## 2023-05-22 DIAGNOSIS — Z131 Encounter for screening for diabetes mellitus: Secondary | ICD-10-CM

## 2023-05-22 DIAGNOSIS — Z1329 Encounter for screening for other suspected endocrine disorder: Secondary | ICD-10-CM

## 2023-05-22 MED ORDER — AMOXICILLIN-POT CLAVULANATE 875-125 MG PO TABS
1.0000 | ORAL_TABLET | Freq: Two times a day (BID) | ORAL | 0 refills | Status: DC
Start: 2023-05-22 — End: 2023-05-27
  Filled 2023-05-22: qty 20, 10d supply, fill #0

## 2023-05-22 NOTE — Telephone Encounter (Signed)
I spoke with the patient and advised that an antibiotic has been sent in and she will also come by [facility] to fill out a dental form. Nicole Morgan does not have on on file for her. She also was advised that the number for GI is in her mychart if they do not call her soon, so she can reach out to them.  Nicole Morgan

## 2023-05-22 NOTE — Telephone Encounter (Signed)
First available appointment for a new patient with our dental team is 06/18/23. She hasn't filled out a form yet, but Nicole Morgan was able to schedule her. Also she is asking if an antibiotic can be given for the tooth. Also she would like for you to go ahead and place the referral for GI

## 2023-05-23 LAB — HEMOGLOBIN A1C
Est. average glucose Bld gHb Est-mCnc: 105 mg/dL
Hgb A1c MFr Bld: 5.3 % (ref 4.8–5.6)

## 2023-05-23 LAB — LIPID PANEL
Chol/HDL Ratio: 2.4 ratio (ref 0.0–4.4)
Cholesterol, Total: 148 mg/dL (ref 100–199)
HDL: 62 mg/dL (ref >39)
LDL Chol Calc (NIH): 71 mg/dL (ref 0–99)
Triglycerides: 80 mg/dL (ref 0–149)
VLDL Cholesterol Cal: 15 mg/dL (ref 5–40)

## 2023-05-23 LAB — COMPREHENSIVE METABOLIC PANEL
ALT: 11 IU/L (ref 0–32)
AST: 16 IU/L (ref 0–40)
Albumin: 4.5 g/dL (ref 4.0–5.0)
Alkaline Phosphatase: 70 IU/L (ref 44–121)
BUN/Creatinine Ratio: 11 (ref 9–23)
BUN: 8 mg/dL (ref 6–20)
Bilirubin Total: 0.5 mg/dL (ref 0.0–1.2)
CO2: 23 mmol/L (ref 20–29)
Calcium: 9.4 mg/dL (ref 8.7–10.2)
Chloride: 102 mmol/L (ref 96–106)
Creatinine, Ser: 0.75 mg/dL (ref 0.57–1.00)
Globulin, Total: 2.2 g/dL (ref 1.5–4.5)
Glucose: 98 mg/dL (ref 70–99)
Potassium: 4.6 mmol/L (ref 3.5–5.2)
Sodium: 138 mmol/L (ref 134–144)
Total Protein: 6.7 g/dL (ref 6.0–8.5)
eGFR: 110 mL/min/{1.73_m2} (ref >59)

## 2023-05-23 LAB — CBC
Hematocrit: 39.4 % (ref 34.0–46.6)
Hemoglobin: 12.9 g/dL (ref 11.1–15.9)
MCH: 30.1 pg (ref 26.6–33.0)
MCHC: 32.7 g/dL (ref 31.5–35.7)
MCV: 92 fL (ref 79–97)
Platelets: 375 10*3/uL (ref 150–450)
RBC: 4.28 x10E6/uL (ref 3.77–5.28)
RDW: 13 % (ref 11.7–15.4)
WBC: 5.6 10*3/uL (ref 3.4–10.8)

## 2023-05-23 LAB — VITAMIN D 25 HYDROXY (VIT D DEFICIENCY, FRACTURES): Vit D, 25-Hydroxy: 41.8 ng/mL (ref 30.0–100.0)

## 2023-05-23 LAB — TSH: TSH: 0.475 u[IU]/mL (ref 0.450–4.500)

## 2023-05-27 ENCOUNTER — Other Ambulatory Visit (HOSPITAL_COMMUNITY): Payer: Self-pay

## 2023-05-27 ENCOUNTER — Encounter: Payer: Self-pay | Admitting: Family Medicine

## 2023-05-27 ENCOUNTER — Ambulatory Visit (INDEPENDENT_AMBULATORY_CARE_PROVIDER_SITE_OTHER): Payer: BC Managed Care – PPO | Admitting: Family Medicine

## 2023-05-27 VITALS — BP 109/71 | HR 96 | Resp 18 | Ht 61.5 in | Wt 114.0 lb

## 2023-05-27 DIAGNOSIS — F339 Major depressive disorder, recurrent, unspecified: Secondary | ICD-10-CM

## 2023-05-27 DIAGNOSIS — F411 Generalized anxiety disorder: Secondary | ICD-10-CM

## 2023-05-27 DIAGNOSIS — F319 Bipolar disorder, unspecified: Secondary | ICD-10-CM

## 2023-05-27 DIAGNOSIS — R634 Abnormal weight loss: Secondary | ICD-10-CM | POA: Diagnosis not present

## 2023-05-27 DIAGNOSIS — Z0000 Encounter for general adult medical examination without abnormal findings: Secondary | ICD-10-CM

## 2023-05-27 MED ORDER — PROPRANOLOL HCL 10 MG PO TABS
10.0000 mg | ORAL_TABLET | ORAL | 1 refills | Status: DC | PRN
Start: 2023-05-27 — End: 2024-01-21
  Filled 2023-05-27: qty 30, 30d supply, fill #0

## 2023-05-27 NOTE — Assessment & Plan Note (Signed)
Infectious disease provider does not suspect that weight loss is related to HIV infection.  Lab results do not indicate any clear cause.  We discussed starting with appointment with gastroenterology and establishing with psychiatry to optimize GI and mental health.  In the meantime, we discussed increasing protein intake to ensure that she is getting adequate nutrition.  Patient verbalized understanding and is agreeable to this plan.

## 2023-05-27 NOTE — Progress Notes (Signed)
Complete physical exam  Patient: Nicole Morgan   DOB: 1996-08-09   27 y.o. Female  MRN: [id number]  Subjective:    Chief Complaint  Patient presents with   Annual Exam    Nicole Morgan is a 27 y.o. female who presents today for a complete physical exam. She reports consuming a general diet, though she does not eat very much meat.  She stays active caring for her daughter.  She generally feels fairly well, though she continues to lose weight.  She does not have a huge appetite which could be contributing, and it has also been difficult to eat due to right upper jaw pain with a dental infection.  This is being followed and managed by infectious disease.  She also endorses rectal bleeding and ongoing GI discomfort and would like to see gastroenterology, a referral has been placed by her infectious disease provider.  She has not established with psychiatry yet to be reevaluated for her mental health.   Most recent fall risk assessment:    05/19/2023    9:28 AM  Fall Risk   Falls in the past year? 0  Risk for fall due to : No Fall Risks  Follow up Falls evaluation completed     Most recent depression and anxiety screenings:    05/27/2023    9:38 AM 05/19/2023    9:28 AM  PHQ 2/9 Scores  PHQ - 2 Score 2 1  PHQ- 9 Score 6       05/27/2023    9:39 AM 11/08/2022   11:27 AM 10/30/2022    4:33 PM 09/30/2022    9:14 AM  GAD 7 : Generalized Anxiety Score  Nervous, Anxious, on Edge 2 0 0 1  Control/stop worrying 2 0 0 1  Worry too much - different things 2 0 0 0  Trouble relaxing 3 0 0 0  Restless 3 0 0 0  Easily annoyed or irritable 2 0 0 0  Afraid - awful might happen 1 0 0 1  Total GAD 7 Score 15 0 0 3  Anxiety Difficulty Somewhat difficult Not difficult at all      Patient Active Problem List   Diagnosis Date Noted   Weight loss, unintentional 05/20/2023   Rectal bleeding 05/20/2023   GAD (generalized anxiety disorder) 11/08/2022   PTSD (post-traumatic stress disorder)  11/08/2022   Depression, recurrent (HCC) 11/08/2022   HIV (human immunodeficiency virus infection) (HCC) 05/01/2022   Cystic fibrosis carrier 04/09/2022   History of substance use disorder 04/08/2021   Bipolar 1 disorder (HCC) 04/08/2021    Past Surgical History:  Procedure Laterality Date   CERVICAL CERCLAGE  06/06/2012   Procedure: CERCLAGE CERVICAL;  Surgeon: Bing Plume, MD;  Location: [facility];  Service: Gynecology;  Laterality: N/A;   LAPAROSCOPIC BILATERAL SALPINGECTOMY Bilateral 10/08/2022   Procedure: LAPAROSCOPIC BILATERAL SALPINGECTOMY;  Surgeon: Warden Fillers, MD;  Location: [facility] OR;  Service: Gynecology;  Laterality: Bilateral;   PLACEMENT OF BREAST IMPLANTS     WISDOM TOOTH EXTRACTION     Social History   Tobacco Use   Smoking status: Every Day    Types: E-cigarettes    Last attempt to quit: 04/2022    Years since quitting: 1.0    Passive exposure: Current   Smokeless tobacco: Never  Vaping Use   Vaping status: Every Day   Substances: Nicotine  Substance Use Topics   Alcohol use: Not Currently   Drug use: Not Currently  Frequency: 1.0 times per week    Types: Marijuana    Comment: last day 09/26/22-   Family History  Problem Relation Age of Onset   Bipolar disorder Mother        schizophrenic   Mental illness Mother        schizophrenia   Deep vein thrombosis Maternal Aunt    Alcohol abuse Maternal Grandmother    Mental illness Maternal Grandmother    Bipolar disorder Maternal Grandmother        schizophrenic    Cancer Maternal Grandmother        lymph nodes   Migraines Neg Hx    Headache Neg Hx    No Known Allergies   Patient Care Team: Melida Quitter, PA as PCP - General (Family Medicine) Kathlene Cote as PCP - OBGYN (Obstetrics and Gynecology)   Outpatient Medications Prior to Visit  Medication Sig   dolutegravir-lamiVUDine (DOVATO) 50-300 MG tablet Take 1 tablet by mouth daily.   lidocaine (XYLOCAINE) 2 % solution Use 15 mLs  in the mouth or throat as directed as needed for mouth pain.   [DISCONTINUED] amoxicillin-clavulanate (AUGMENTIN) 875-125 MG tablet Take 1 tablet by mouth 2 (two) times daily.   No facility-administered medications prior to visit.    Review of Systems  Constitutional:  Negative for chills, fever and malaise/fatigue.  HENT:  Negative for congestion and hearing loss.   Eyes:  Negative for blurred vision and double vision.  Respiratory:  Negative for cough and shortness of breath.   Cardiovascular:  Negative for chest pain, palpitations and leg swelling.  Gastrointestinal:  Negative for abdominal pain, constipation, diarrhea and heartburn.       Decreased appetite  Genitourinary:  Negative for frequency and urgency.  Musculoskeletal:  Negative for myalgias and neck pain.  Neurological:  Negative for headaches.  Endo/Heme/Allergies:  Negative for polydipsia.  Psychiatric/Behavioral:  Negative for depression. The patient is not nervous/anxious.       Objective:    BP 109/71 (BP Location: Left Arm, Patient Position: Sitting, Cuff Size: Normal)   Pulse 96   Resp 18   Ht 5' 1.5" (1.562 m)   Wt 114 lb (51.7 kg)   SpO2 96%   Breastfeeding No   BMI 21.19 kg/m    Physical Exam Constitutional:      General: She is not in acute distress.    Appearance: Normal appearance.  HENT:     Head: Normocephalic and atraumatic.     Right Ear: Tympanic membrane, ear canal and external ear normal.     Left Ear: Tympanic membrane, ear canal and external ear normal.     Nose: Nose normal.     Mouth/Throat:     Mouth: Mucous membranes are moist.     Pharynx: No oropharyngeal exudate or posterior oropharyngeal erythema.  Eyes:     Extraocular Movements: Extraocular movements intact.     Conjunctiva/sclera: Conjunctivae normal.     Pupils: Pupils are equal, round, and reactive to light.  Cardiovascular:     Rate and Rhythm: Normal rate and regular rhythm.     Heart sounds: Normal heart sounds. No  murmur heard.    No friction rub. No gallop.  Pulmonary:     Effort: Pulmonary effort is normal. No respiratory distress.     Breath sounds: Normal breath sounds. No wheezing, rhonchi or rales.  Abdominal:     General: Abdomen is flat. Bowel sounds are normal. There is no distension.  Palpations: There is no mass.     Tenderness: There is no abdominal tenderness. There is no guarding.  Musculoskeletal:        General: Normal range of motion.     Cervical back: Normal range of motion.  Skin:    General: Skin is warm and dry.  Neurological:     Mental Status: She is alert and oriented to person, place, and time.     Cranial Nerves: No cranial nerve deficit.     Motor: No weakness.     Deep Tendon Reflexes: Reflexes normal.  Psychiatric:        Mood and Affect: Mood normal.       Assessment & Plan:    Routine Health Maintenance and Physical Exam  Immunization History  Administered Date(s) Administered   Influenza Inj Mdck Quad Pf 06/01/2018   Influenza,inj,Quad PF,6+ Mos 06/07/2022   Influenza-Unspecified 06/01/2018   MMR 10/18/2012, 07/26/2022   PPD Test 06/01/2018   Respiratory Syncytial Virus Vaccine,Recomb Aduvanted(Arexvy) 07/15/2022   Rho (D) Immune Globulin 10/18/2012   Tdap 05/14/2022    Health Maintenance  Topic Date Due   INFLUENZA VACCINE  07/24/2023 (Originally 04/24/2023)   PAP-Cervical Cytology Screening  02/28/2025   PAP SMEAR-Modifier  02/28/2025   DTaP/Tdap/Td (2 - Td or Tdap) 05/14/2032   Hepatitis C Screening  Completed   HIV Screening  Completed   HPV VACCINES  Aged Out   COVID-19 Vaccine  Discontinued    Reviewed most recent labs including CBC, CMP, lipid panel, A1C, TSH, and vitamin D. All within normal limits. Up-to-date on Pap smear, Tdap, HIV/hepatitis C screenings.  Discussed health benefits of physical activity, and encouraged her to engage in regular exercise appropriate for her age and condition.  Wellness examination  Weight loss,  unintentional Assessment & Plan: Infectious disease provider does not suspect that weight loss is related to HIV infection.  Lab results do not indicate any clear cause.  We discussed starting with appointment with gastroenterology and establishing with psychiatry to optimize GI and mental health.  In the meantime, we discussed increasing protein intake to ensure that she is getting adequate nutrition.  Patient verbalized understanding and is agreeable to this plan.   Bipolar 1 disorder (HCC) -     Ambulatory referral to Psychiatry  GAD (generalized anxiety disorder) -     Ambulatory referral to Psychiatry -     Propranolol HCl; Take 1 tablet (10 mg total) by mouth as needed (Take before situations that cause anxiety.).  Dispense: 30 tablet; Refill: 1  Depression, recurrent (HCC) -     Ambulatory referral to Psychiatry  Sending referral to psychiatry, confirmed that they should contact patient at [phone].  Patient will be expecting a call from them to schedule an appointment.  Return in about 3 months (around 08/26/2023) for follow-up for weight loss.     Melida Quitter, PA

## 2023-05-27 NOTE — Patient Instructions (Signed)
Aim for 80-90 g of protein each day. You can find some great ideas online for more plant-based protein options, too!  Foods that are high in protein include: Meat. Poultry. Fish. Eggs. Cheese. Milk. Beans. Nuts. Eat nutrient-rich foods that are easy to swallow and digest, such as: Fruit and yogurt smoothies. Oatmeal with nut butter. Nutrition supplement drinks.

## 2023-05-28 ENCOUNTER — Ambulatory Visit (INDEPENDENT_AMBULATORY_CARE_PROVIDER_SITE_OTHER): Payer: Medicaid Other | Admitting: Psychiatry

## 2023-05-28 ENCOUNTER — Other Ambulatory Visit (HOSPITAL_COMMUNITY): Payer: Self-pay

## 2023-05-28 ENCOUNTER — Encounter (HOSPITAL_COMMUNITY): Payer: Self-pay | Admitting: Psychiatry

## 2023-05-28 VITALS — BP 100/68 | HR 80 | Resp 15 | Wt 115.8 lb

## 2023-05-28 DIAGNOSIS — F411 Generalized anxiety disorder: Secondary | ICD-10-CM | POA: Diagnosis not present

## 2023-05-28 DIAGNOSIS — F603 Borderline personality disorder: Secondary | ICD-10-CM

## 2023-05-28 DIAGNOSIS — F1290 Cannabis use, unspecified, uncomplicated: Secondary | ICD-10-CM

## 2023-05-28 DIAGNOSIS — F17200 Nicotine dependence, unspecified, uncomplicated: Secondary | ICD-10-CM | POA: Insufficient documentation

## 2023-05-28 DIAGNOSIS — F1421 Cocaine dependence, in remission: Secondary | ICD-10-CM | POA: Diagnosis not present

## 2023-05-28 DIAGNOSIS — F4310 Post-traumatic stress disorder, unspecified: Secondary | ICD-10-CM

## 2023-05-28 MED ORDER — MIRTAZAPINE 15 MG PO TABS
15.0000 mg | ORAL_TABLET | Freq: Every day | ORAL | 2 refills | Status: DC
Start: 2023-05-28 — End: 2023-08-13
  Filled 2023-05-28: qty 30, 30d supply, fill #0
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
## 2023-05-28 NOTE — Patient Instructions (Signed)
Please take remeron 15mg  at nighttime.   Here's the link to the dialectical behavioral therapy workbook: https://www.mosley.info/.pdf

## 2023-05-29 NOTE — Addendum Note (Signed)
Addended by: Tia Masker on: 05/29/2023 01:25 PM   Modules accepted: Level of Service

## 2023-06-10 ENCOUNTER — Other Ambulatory Visit: Payer: Self-pay

## 2023-06-10 ENCOUNTER — Other Ambulatory Visit (HOSPITAL_COMMUNITY): Payer: Self-pay

## 2023-06-16 ENCOUNTER — Other Ambulatory Visit: Payer: Self-pay

## 2023-06-26 ENCOUNTER — Other Ambulatory Visit: Payer: Self-pay

## 2023-06-26 ENCOUNTER — Other Ambulatory Visit (HOSPITAL_COMMUNITY): Payer: Self-pay

## 2023-06-26 DIAGNOSIS — K047 Periapical abscess without sinus: Secondary | ICD-10-CM

## 2023-06-26 MED ORDER — AMOXICILLIN-POT CLAVULANATE 875-125 MG PO TABS
1.0000 | ORAL_TABLET | Freq: Two times a day (BID) | ORAL | 0 refills | Status: DC
  Filled 2023-06-26: qty 6, 3d supply, fill #0
  Filled 2023-06-26: qty 14, 7d supply, fill #0

## 2023-06-27 ENCOUNTER — Other Ambulatory Visit (HOSPITAL_COMMUNITY): Payer: Self-pay

## 2023-07-09 ENCOUNTER — Other Ambulatory Visit: Payer: Self-pay

## 2023-07-09 ENCOUNTER — Other Ambulatory Visit (HOSPITAL_COMMUNITY): Payer: Self-pay

## 2023-07-09 NOTE — Progress Notes (Signed)
Specialty Pharmacy Ongoing Clinical Assessment Note  Nicole Morgan is a 27 y.o. female who is being followed by the specialty pharmacy service for RxSp HIV   Patient's specialty medication(s) reviewed today: Dolutegravir-Lamivudine   Missed doses in the last 4 weeks: 0   Patient/Caregiver did not have any additional questions or concerns.   Therapeutic benefit summary: Patient is achieving benefit   Adverse events/side effects summary: No adverse events/side effects   Patient's therapy is appropriate to: Continue    Goals Addressed             This Visit's Progress    Achieve Undetectable HIV Viral Load < 20       Patient is on track. Patient will maintain adherence         Follow up:  6 months  Bobette Mo Specialty Pharmacist

## 2023-07-09 NOTE — Progress Notes (Signed)
Specialty Pharmacy Refill Coordination Note  Nicole Morgan is a 27 y.o. female contacted today regarding refills of specialty medication(s) Dolutegravir-Lamivudine   Patient requested Delivery   Delivery date: 07/16/23   Verified address: [address]   Medication will be filled on 07/15/23.

## 2023-07-15 ENCOUNTER — Other Ambulatory Visit (HOSPITAL_COMMUNITY): Payer: Self-pay

## 2023-07-17 ENCOUNTER — Ambulatory Visit (INDEPENDENT_AMBULATORY_CARE_PROVIDER_SITE_OTHER): Payer: Medicaid Other | Admitting: Obstetrics and Gynecology

## 2023-07-17 ENCOUNTER — Other Ambulatory Visit: Payer: Self-pay

## 2023-07-17 ENCOUNTER — Other Ambulatory Visit (HOSPITAL_COMMUNITY)
Admission: RE | Admit: 2023-07-17 | Discharge: 2023-07-17 | Disposition: A | Discharge status: Home / Self Care | Payer: Medicaid Other | Source: Ambulatory Visit | Attending: Obstetrics and Gynecology | Admitting: Obstetrics and Gynecology

## 2023-07-17 VITALS — BP 107/66 | HR 87 | Ht 61.5 in | Wt 118.2 lb

## 2023-07-17 DIAGNOSIS — N760 Acute vaginitis: Secondary | ICD-10-CM

## 2023-07-17 DIAGNOSIS — N941 Unspecified dyspareunia: Secondary | ICD-10-CM | POA: Diagnosis not present

## 2023-07-17 DIAGNOSIS — R102 Pelvic and perineal pain: Secondary | ICD-10-CM | POA: Diagnosis not present

## 2023-07-17 DIAGNOSIS — N949 Unspecified condition associated with female genital organs and menstrual cycle: Secondary | ICD-10-CM | POA: Insufficient documentation

## 2023-07-17 DIAGNOSIS — B9689 Other specified bacterial agents as the cause of diseases classified elsewhere: Secondary | ICD-10-CM

## 2023-07-17 NOTE — Progress Notes (Signed)
   GYNECOLOGY PROGRESS NOTE  History:  27 y.o. [id number] presents to [facility] office today for problem gyn visit.  She is s/p BTL. Reports right lower ABD/pelvic  pain and dyspareunia for a few months. Reports most often feeling the pain with intercourse. No pain with insertion, periodic pain with deeper penetration. Denies bleeding, n/v. Also being referred to GI d/t ongoing GI discomfort, has appt in December.  Reports recent antibiotic for dental infection, caused to have yeast symptoms. Has been taking monistat cream.   The following portions of the patient's history were reviewed and updated as appropriate: allergies, current medications, past family history, past medical history, past social history, past surgical history and problem list. Last pap smear on 07/24/22 was normal  There are no preventive care reminders to display for this patient.   Review of Systems:  Pertinent items are noted in HPI.   Objective:  Physical Exam Blood pressure 107/66, pulse 87, height 5' 1.5" (1.562 m), weight 118 lb 3.2 oz (53.6 kg), last menstrual period 07/10/2023, not currently breastfeeding. VS reviewed, nursing note reviewed,  Constitutional: well developed, well nourished, no distress HEENT: normocephalic CV: normal rate Pulm/chest wall: normal effort Abdomen: soft, neg rebound tenderness, neg mcburney  Neuro: alert and oriented  Skin: warm, dry Psych: affect normal Pelvic exam: deferred  Assessment & Plan:  1. Pelvic pain 2. Dyspareunia in female Checking swabs today. Discussed pelvic u/s and trial of pelvic PT. Seeing GI in December  - US PELVIC COMPLETE WITH TRANSVAGINAL; Future - Ambulatory referral to Physical Therapy  3. Vaginal discomfort Self swab collected - Cervicovaginal ancillary only( [place])  Return in three months for follow up  Albertine Grates, FNP

## 2023-07-18 LAB — POCT URINALYSIS DIP (DEVICE)
Bilirubin Urine: NEGATIVE
Glucose, UA: NEGATIVE mg/dL
Hgb urine dipstick: NEGATIVE
Ketones, ur: NEGATIVE mg/dL
Leukocytes,Ua: NEGATIVE
Nitrite: NEGATIVE
Protein, ur: NEGATIVE mg/dL
Specific Gravity, Urine: 1.015 (ref 1.005–1.030)
Urobilinogen, UA: 0.2 mg/dL (ref 0.0–1.0)
pH: 6 (ref 5.0–8.0)

## 2023-07-21 ENCOUNTER — Other Ambulatory Visit (HOSPITAL_COMMUNITY): Payer: Self-pay

## 2023-07-21 LAB — CERVICOVAGINAL ANCILLARY ONLY
Bacterial Vaginitis (gardnerella): POSITIVE — AB
Candida Glabrata: NEGATIVE
Candida Vaginitis: NEGATIVE
Chlamydia: NEGATIVE
Comment: NEGATIVE
Comment: NEGATIVE
Comment: NEGATIVE
Comment: NEGATIVE
Comment: NEGATIVE
Comment: NORMAL
Neisseria Gonorrhea: NEGATIVE
Trichomonas: NEGATIVE

## 2023-07-22 ENCOUNTER — Ambulatory Visit (HOSPITAL_COMMUNITY): Payer: Medicaid Other | Admitting: Clinical

## 2023-07-23 ENCOUNTER — Other Ambulatory Visit (HOSPITAL_COMMUNITY): Payer: Self-pay

## 2023-07-23 ENCOUNTER — Ambulatory Visit (HOSPITAL_BASED_OUTPATIENT_CLINIC_OR_DEPARTMENT_OTHER)
Admission: RE | Admit: 2023-07-23 | Discharge: 2023-07-23 | Disposition: A | Discharge status: Home / Self Care | Payer: Medicaid Other | Source: Ambulatory Visit | Attending: Obstetrics and Gynecology | Admitting: Obstetrics and Gynecology

## 2023-07-23 DIAGNOSIS — N941 Unspecified dyspareunia: Secondary | ICD-10-CM | POA: Diagnosis present

## 2023-07-23 DIAGNOSIS — R102 Pelvic and perineal pain: Secondary | ICD-10-CM | POA: Diagnosis present

## 2023-07-23 MED ORDER — METRONIDAZOLE 500 MG PO TABS
500.0000 mg | ORAL_TABLET | Freq: Two times a day (BID) | ORAL | 0 refills | Status: AC
Start: 2023-07-23 — End: 2023-07-30
  Filled 2023-07-23: qty 14, 7d supply, fill #0

## 2023-07-23 NOTE — Addendum Note (Signed)
Addended by: Sue Lush on: 07/23/2023 08:03 AM   Modules accepted: Orders

## 2023-07-30 ENCOUNTER — Encounter (HOSPITAL_COMMUNITY): Payer: Medicaid Other | Admitting: Psychiatry

## 2023-07-31 ENCOUNTER — Other Ambulatory Visit: Payer: Self-pay

## 2023-07-31 ENCOUNTER — Other Ambulatory Visit (HOSPITAL_COMMUNITY): Payer: Self-pay

## 2023-07-31 NOTE — Progress Notes (Signed)
Specialty Pharmacy Refill Coordination Note  Nicole Morgan is a 27 y.o. female contacted today regarding refills of specialty medication(s) Dolutegravir-Lamivudine   Patient requested Delivery   Delivery date: 08/14/23   Verified address: [address]   Medication will be filled on 08/13/23.

## 2023-08-11 NOTE — Progress Notes (Unsigned)
BH MD Outpatient Progress Note  08/13/2023 4:42 PM Nicole Morgan  MRN:  [id number]  Assessment:  Nicole Morgan presents for follow-up evaluation. Today, 08/13/23, patient reports increased depressive and anxiety symptoms. She reports increased feelings of being overwhelmed, feelings of guilt, decreased concentration, increased appetite, decreased sleep, increased irritability, increased anhedonia (PHQ 9 16). She reports stressors include job loss, financial stress, being a single mom, and the upcoming holidays. She denies any SI/HI/AVH. She denies any NSSIB or additional drug use besides cannabis. We discussed a safety plan including talking with her support system which includes her close friends and grandma. She continues to be future-oriented, her children are a motivation and protective factor for her. Supportive and positive psychotherapy was provided to the patient. She reports she will also follow-up with therapist appointment. We also discussed decreasing her caffeine intake and increasing her remeron for improvements in her sleep. We also discussed starting lexapro for her depression a week after she increases her remeron. We discussed the importance of following up with therapy. We discussed cannabis cessation, she reports she may continue with cessation given she has already started a period of sobriety (2 days) and recently had not noticed changes in her mood with the cannabis use.   Identifying Information: Nicole Morgan is a 27 y.o. female with a history of bipolar I who is an established patient with Nicole Morgan for management of anxiety.   Plan: # Major Depressive Disorder, single episode, moderate # Borderline personality disorder Past medication trials: seroquel, abilify Status of problem: ongoing Interventions: -- Start lexapro 10mg  daily in 7 days (11/27) for depressive symptoms  -- Increase remeron 30mg  at bedtime -- Set up patient with therapy --  Sent patient pdf of Nicole Morgan handbook -- Continue to discourage self-harm as a coping mechanism -- Discussed with patient cutting down on caffeine before bedtime    # Generalized anxiety disorder Past medication trials: propranolol Status of problem: ongoing Interventions: -- Start lexapro 10mg  daily in 7 days for depressive symptoms  -- Increase remeron 30mg  at bedtime now -- Continue propranolol from PCP PRN for social anxiety   # PTSD Past medication trials: none Status of problem: ongoing Interventions: -- Referred patient to therapy    # Cocaine use disorder, in sustained remission Past medication trials: none Status of problem: ongoing Interventions: -- Continue to encourage cessation   # Tobacco use disorder Past medication trials: none Status of problem: ongoing Interventions: -- Continue to encourage cessation -- start nicotine patch    # Cannabis use disorder Past medication trials: none Status of problem: ongoing Interventions: -- Continue to encourage cessation  Patient was given contact information for behavioral [facility] clinic and was instructed to call 911 for emergencies.   Return to clinic 10/08/23.   Subjective:  Chief Complaint: "I'm feeling overwhelmed"   Interval History:  -Saw Nicole Morgan outpatient, had dyspareunia, pelvic U/S nl, +BV -Has appointment with Nicole Morgan in December for ongoing Nicole Morgan discomfort   She comes into the appointment and states that she's stressed. She is easily tearful during assessment.  Reports increased stressed, reports decreased patience, will snap at her son. Reports she almost got into a fight with a stranger. Reports then will feel guilty regarding snapping at her son. Reports financial stress with losing job soon after last appointment. Also stress about children's birthdays coming up and the holidays. Reports feeling like she can't calm herself down. Reports been feeling depressed, wanting to isolate herself but have been staying  with  parents. Reports she is trying to use new ways to cope instead of old ways. Reports continued tossing and turning at night. Going to bed between 9-11pm and get up around 6:30-8am - "still feel like dragging ass." Reports does not drink coffee past 1pm. Reports drinking sodas, coke, until bedtime. Reports the remeron helps with increased appetite and sleep. Reports taking remeron every day. Reports feeling increased appetite. Report eating 1 good meal a day and snacks around. Reports decreased energy. Reports decreased concentration. Reports stress would decrease with getting Nicole Morgan job. Denies self-injurious behavior or substance use "You can't unlock new doors with old keys." Denies thoughts of hurting herself, states that she could not do this because of her children.  Despite feeling guilty and overwhelmed, she is attempting to increase insight. Reports she has been working out to help with her anxiety and depression, continued to encourage patient to engage in physical activity.  Reports only using cannabis, last smoked 2 days ago and has not felt changes in her mood with cannabis use. Feels like she is wasting money with current cannabis use. She reports increased vaping with current stress, discussed can start nicotine patch and patient agreeable. 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 Currently waiting to hear back on a position with Nicole Morgan.Reports she lost her job around last appointment because she has more medical appointments due to Nicole Morgan (for example getting dental work, Nicole Morgan work-up). Reports one occasion of blood in her stool, reports has upcoming appointment for Nicole Morgan work-up.   Reports support system includes dad moved up here and is 5 years sober. Step family complicated relationship because they wanted custody of her kids. She also has 3 friends. Also grandma.   Reports still a lot of fear and hurt with daughter's dad. Reports not  normally having nightmares. Reports having flashbacks once a week - 3-4x/week.   PHQ-9: 16  Visit Diagnosis:    ICD-10-CM   1. Major depressive disorder, single episode, severe (HCC)  F32.2 mirtazapine (REMERON) 30 MG tablet    escitalopram (LEXAPRO) 10 MG tablet    2. Tobacco use disorder  F17.200 nicotine (NICODERM CQ - DOSED IN MG/24 HOURS) 21 mg/24hr patch      Past Psychiatric History:  Diagnoses: anxiety, depression, PTSD, bipolar I Medication trials: propranolol 10 PRN for anxiety, abilify 15, elavil 10 at bedtime, gabapentin 600 TID, seroquel 250 for insomnia   Previous psychiatrist/therapist: went to [facility] before but then would use again Hospitalizations: at 27 y/o (reports didn't want to be at home and wanted to run away) and 4 years ago for self-harm (scratched self) Suicide attempts: denies SIB: cut or burn self, ripped hair out, scratched self to the point left bruises. Reports last time was beginning of July.   Hx of violence towards others: none Current access to guns: denies  Hx of trauma/abuse: Reports hard time being around guns due to past trauma.   Past Medical History:  Past Medical History:  Diagnosis Date   Anxiety 2020   Bipolar 1 disorder (HCC) 2020   Cystic fibrosis carrier    Depression    no meds   Eczema    Nicole Morgan (human immunodeficiency virus infection) (HCC) 04/2022   Incompetent cervix    Insomnia 2020   PTSD (post-traumatic stress disorder) 2020   Substance abuse (HCC)    alcohol, cocaine, marijuana   UTI (urinary tract infection)     Past  Surgical History:  Procedure Laterality Date   CERVICAL CERCLAGE  06/06/2012   Procedure: CERCLAGE CERVICAL;  Surgeon: Bing Plume, MD;  Location: [facility];  Service: Gynecology;  Laterality: N/A;   LAPAROSCOPIC BILATERAL SALPINGECTOMY Bilateral 10/08/2022   Procedure: LAPAROSCOPIC BILATERAL SALPINGECTOMY;  Surgeon: Warden Fillers, MD;  Location: [facility] OR;  Service: Gynecology;  Laterality: Bilateral;    PLACEMENT OF BREAST IMPLANTS     WISDOM TOOTH EXTRACTION     Family Psychiatric History:  Alcohol abuse in her maternal grandmother; Bipolar disorder in her mother;    Family History:  Family History  Problem Relation Age of Onset   Bipolar disorder Mother        schizophrenic   Mental illness Mother        schizophrenia   Deep vein thrombosis Maternal Aunt    Alcohol abuse Maternal Grandmother    Mental illness Maternal Grandmother    Bipolar disorder Maternal Grandmother        schizophrenic    Cancer Maternal Grandmother        lymph nodes   Migraines Neg Hx    Headache Neg Hx     Social History:  Academic/Vocational: Works as a Lawyer.  -Has daughter (10 months) and son (10-11) -Daughter's dad not around, "he did me wrong" -Has good relationship with son's father  -Currently her father is helping out with daughter  Social History   Socioeconomic History   Marital status: Single    Spouse name: Not on file   Number of children: 1   Years of education: Not on file   Highest education level: Some college, no degree  Occupational History    Employer: [employer]  Tobacco Use   Smoking status: Every Day    Types: E-cigarettes    Last attempt to quit: 04/2022    Years since quitting: 1.3    Passive exposure: Current   Smokeless tobacco: Never  Vaping Use   Vaping status: Every Day   Substances: Nicotine  Substance and Sexual Activity   Alcohol use: Not Currently   Drug use: Not Currently    Frequency: 1.0 times per week    Types: Marijuana    Comment: last day 09/26/22-   Sexual activity: Not Currently  Other Topics Concern   Not on file  Social History Narrative   Lives with child    Right handed   Caffeine: about 3 cups/day maybe more   Social Determinants of Morgan   Financial Resource Strain: Medium Risk (05/16/2022)   Overall Financial Resource Strain (CARDIA)    Difficulty of Paying Living Expenses: Somewhat hard  Food Insecurity: No Food  Insecurity (09/30/2022)   Hunger Vital Sign    Worried About Running Out of Food in the Last Year: Never true    Ran Out of Food in the Last Year: Never true  Recent Concern: Food Insecurity - Food Insecurity Present (07/24/2022)   Hunger Vital Sign    Worried About Running Out of Food in the Last Year: Sometimes true    Ran Out of Food in the Last Year: Never true  Transportation Needs: No Transportation Needs (09/30/2022)   PRAPARE - Administrator, Civil Service (Medical): No    Lack of Transportation (Non-Medical): No  Physical Activity: Sufficiently Active (05/16/2022)   Exercise Vital Sign    Days of Exercise per Week: 7 days    Minutes of Exercise per Session: 30 min  Stress: Stress Concern Present (05/16/2022)  [employer] of Occupational Morgan - Occupational Stress Questionnaire    Feeling of Stress : Very much  Social Connections: Unknown (01/17/2023)   Received from [facility]   Social Network    Social Network: Not on file    Allergies: No Known Allergies  Current Medications: Current Outpatient Medications  Medication Sig Dispense Refill   dolutegravir-lamiVUDine (DOVATO) 50-300 MG tablet Take 1 tablet by mouth daily. 30 tablet 5   [START ON 08/20/2023] escitalopram (LEXAPRO) 10 MG tablet Take 1 tablet (10 mg total) by mouth daily. 30 tablet 1   mirtazapine (REMERON) 30 MG tablet Take 1 tablet (30 mg total) by mouth at bedtime. 30 tablet 1   nicotine (NICODERM CQ - DOSED IN MG/24 HOURS) 21 mg/24hr patch Place 1 patch (21 mg total) onto the skin daily. 30 patch 1   propranolol (INDERAL) 10 MG tablet Take 1 tablet (10 mg total) by mouth as needed (Take before situations that cause anxiety.). 30 tablet 1   amoxicillin-clavulanate (AUGMENTIN) 875-125 MG tablet Take 1 tablet by mouth 2 (two) times daily. (Patient not taking: Reported on 08/13/2023) 20 tablet 0   lidocaine (XYLOCAINE) 2 % solution Use 15 mLs in the mouth or throat as directed as  needed for mouth pain. (Patient not taking: Reported on 08/13/2023) 100 mL 0   No current facility-administered medications for this visit.    ROS: Review of Systems  HENT: Negative.    Eyes: Negative.   Respiratory: Negative.    Cardiovascular: Negative.   Gastrointestinal:  Positive for blood in stool.  Endocrine: Negative.   Genitourinary: Negative.   Musculoskeletal: Negative.   Skin: Negative.   Allergic/Immunologic: Negative.   Neurological: Negative.   Hematological: Negative.   Psychiatric/Behavioral:  Positive for sleep disturbance. The patient is anxious and depressed.  Objective:  Psychiatric Specialty Exam: Last menstrual period 07/10/2023, not currently breastfeeding.There is no height or weight on file to calculate BMI.  General Appearance: Fairly Groomed  Eye Contact:  Good  Speech:  Clear and Coherent  Volume:  Normal  Mood:  Depressed  Affect:  Appropriate  Thought Content: Logical   Suicidal Thoughts:  No  Homicidal Thoughts:  No  Thought Process:  Goal Directed  Orientation:  Full (Time, Place, and Person)    Memory: Grossly intact   Judgment:  Good  Insight:  Good  Concentration:  Concentration: Good  Recall: not formally assessed   Fund of Knowledge: Good  Language: Good  Psychomotor Activity:  Normal  Akathisia:  No  AIMS (if indicated): not done  Assets:  Communication Skills Desire for Improvement Housing Intimacy Social Support  ADL's:  Intact  Cognition: WNL  Sleep:  Poor   PE: General: well-appearing; no acute distress  Pulm: no increased work of breathing on room air  Strength & Muscle Tone: within normal limits Neuro: no focal neurological deficits observed  Gait & Station: normal  Metabolic Disorder Labs: Lab Results  Component Value Date   HGBA1C 5.3 05/22/2023   MPG 108 05/19/2023   No results found for: "PROLACTIN" Lab Results  Component Value Date   CHOL 148 05/22/2023   TRIG 80 05/22/2023   HDL 62 05/22/2023    CHOLHDL 2.4 05/22/2023   LDLCALC 71 05/22/2023   LDLCALC 59 05/19/2023   Lab Results  Component Value Date   TSH 0.475 05/22/2023   TSH 0.372 (L) 11/10/2019    Therapeutic Level Labs: No results found for: "LITHIUM" No results found for: "VALPROATE" No  results found for: "CBMZ"  Screenings:  GAD-7    Flowsheet Row Office Visit from 07/17/2023 in [facility] Office Visit from 05/27/2023 in [facility] at [facility] Visit from 11/08/2022 in [facility] [facility] Visit from 10/30/2022 in [facility] at [facility] Office Visit from 09/30/2022 in [facility]  Total GAD-7 Score 9 15 0 0 3      PHQ2-9    Flowsheet Row Office Visit from 07/17/2023 in [facility] at [facility] Office Visit from 05/28/2023 in [facility] Office Visit from 05/27/2023 in [facility] Visit from 05/19/2023 in [facility] [place] Office Visit from 11/08/2022 in [facility] at [facility]  PHQ-2 Total Score 0 2 2 1  0  PHQ-9 Total Score 5 9 6  -- 0      Flowsheet Row ED from 12/24/2022 in [facility] Admission (Discharged) from 10/08/2022 in [place] [facility] ED from 08/18/2022 in [facility] RISK CATEGORY No Risk No Risk No Risk       Collaboration of Care: Collaboration of Care: Medication Management AEB Dr. Adrian Blackwater  Patient/Guardian was advised Release of Information must be obtained prior to any record release in order to collaborate their care with an outside provider. Patient/Guardian was advised if they have not already done so to contact the registration department to sign all necessary  forms in order for [place] to release information regarding their care.   Consent: Patient/Guardian gives verbal consent for treatment and assignment of benefits for services provided during this visit. Patient/Guardian expressed understanding and agreed to proceed.   A total of 60 minutes was spent involved in face to face clinical care, chart review, documentation.  Karie Fetch, MD, PGY-2 08/13/2023, 4:42 PM

## 2023-08-13 ENCOUNTER — Other Ambulatory Visit (HOSPITAL_COMMUNITY): Payer: Self-pay

## 2023-08-13 ENCOUNTER — Ambulatory Visit (HOSPITAL_COMMUNITY): Payer: Medicaid Other | Admitting: Psychiatry

## 2023-08-13 ENCOUNTER — Other Ambulatory Visit: Payer: Self-pay

## 2023-08-13 VITALS — BP 130/85 | HR 90 | Wt 114.6 lb

## 2023-08-13 DIAGNOSIS — F411 Generalized anxiety disorder: Secondary | ICD-10-CM

## 2023-08-13 DIAGNOSIS — F603 Borderline personality disorder: Secondary | ICD-10-CM | POA: Diagnosis not present

## 2023-08-13 DIAGNOSIS — F4310 Post-traumatic stress disorder, unspecified: Secondary | ICD-10-CM

## 2023-08-13 DIAGNOSIS — F17200 Nicotine dependence, unspecified, uncomplicated: Secondary | ICD-10-CM | POA: Diagnosis not present

## 2023-08-13 DIAGNOSIS — F322 Major depressive disorder, single episode, severe without psychotic features: Secondary | ICD-10-CM

## 2023-08-13 DIAGNOSIS — F1290 Cannabis use, unspecified, uncomplicated: Secondary | ICD-10-CM

## 2023-08-13 MED ORDER — ESCITALOPRAM OXALATE 10 MG PO TABS
10.0000 mg | ORAL_TABLET | Freq: Every day | ORAL | 1 refills | Status: DC
Start: 2023-08-20 — End: 2023-10-07
  Filled 2023-08-13 (×2): qty 30, 30d supply, fill #0
  Filled 2023-09-12: qty 30, 30d supply, fill #1

## 2023-08-13 MED ORDER — MIRTAZAPINE 30 MG PO TABS
30.0000 mg | ORAL_TABLET | Freq: Every day | ORAL | 1 refills | Status: DC
Start: 2023-08-13 — End: 2023-10-08
  Filled 2023-08-13 (×2): qty 30, 30d supply, fill #0

## 2023-08-13 MED ORDER — NICOTINE 21 MG/24HR TD PT24
21.0000 mg | MEDICATED_PATCH | TRANSDERMAL | 1 refills | Status: AC
Start: 2023-08-13 — End: 2023-10-12
  Filled 2023-08-13: qty 28, 28d supply, fill #0

## 2023-08-13 NOTE — Patient Instructions (Signed)
Hi Kedra,   Please increase your remeron to 30mg  at bedtime.   Please start taking lexapro on 11/27.   Feel free to call back if any questions or concerns.   Dr. Standley Brooking

## 2023-08-14 ENCOUNTER — Other Ambulatory Visit: Payer: Self-pay

## 2023-08-14 ENCOUNTER — Other Ambulatory Visit (HOSPITAL_COMMUNITY): Payer: Self-pay

## 2023-08-14 ENCOUNTER — Other Ambulatory Visit (HOSPITAL_BASED_OUTPATIENT_CLINIC_OR_DEPARTMENT_OTHER): Payer: Self-pay

## 2023-08-15 ENCOUNTER — Other Ambulatory Visit (HOSPITAL_COMMUNITY): Payer: Self-pay

## 2023-08-20 ENCOUNTER — Other Ambulatory Visit (HOSPITAL_COMMUNITY): Payer: Self-pay

## 2023-08-23 ENCOUNTER — Other Ambulatory Visit (HOSPITAL_COMMUNITY): Payer: Self-pay

## 2023-08-26 ENCOUNTER — Encounter: Payer: Self-pay | Admitting: Family Medicine

## 2023-08-26 ENCOUNTER — Ambulatory Visit (INDEPENDENT_AMBULATORY_CARE_PROVIDER_SITE_OTHER): Payer: Medicaid Other | Admitting: Family Medicine

## 2023-08-26 VITALS — BP 103/70 | HR 72 | Resp 20 | Ht 61.5 in | Wt 118.0 lb

## 2023-08-26 DIAGNOSIS — R634 Abnormal weight loss: Secondary | ICD-10-CM

## 2023-08-26 DIAGNOSIS — N946 Dysmenorrhea, unspecified: Secondary | ICD-10-CM | POA: Insufficient documentation

## 2023-08-26 NOTE — Progress Notes (Signed)
Established Patient Office Visit  Subjective   Patient ID: Nicole Morgan, female    DOB: 03-25-1996  Age: 27 y.o. MRN: [id number]  Chief Complaint  Patient presents with   Weight Management Screening    HPI Nicole Morgan is a 27 y.o. female presenting today for follow up of unintentional weight loss.  At annual physical appointment 3 months ago, no lab results indicated any clear cause.  Referrals were placed to gastroenterology and psychiatry, she has not yet seen gastroenterology but has an upcoming appointment on 09/03/2023.  She has been able to establish with psychiatry, they are managing care including trial of Lexapro and Remeron.  Her next follow-up appointment with them is 10/08/2023.  Overall she is doing well with her new medication routine but notices that she does get tired later in the day.  She is giving the Lexapro the full 6-week trial before making a decision with her behavioral health specialist.  She has noticed an improvement in appetite.  She was evaluated by OB/GYN for dyspareunia, ultrasound was within normal limits.  She has developed new LLQ abdominal pain within the past couple of days.  She is about 2 weeks from starting her next menstrual cycle.  She has noticed that her periods have been more painful than typical ever since her tubal ligation.  On the 10th, she will start a new job with [employer] and is looking forward to it.  Outpatient Medications Prior to Visit  Medication Sig   dolutegravir-lamiVUDine (DOVATO) 50-300 MG tablet Take 1 tablet by mouth daily.   escitalopram (LEXAPRO) 10 MG tablet Take 1 tablet (10 mg total) by mouth daily.   mirtazapine (REMERON) 30 MG tablet Take 1 tablet (30 mg total) by mouth at bedtime.   nicotine (NICODERM CQ - DOSED IN MG/24 HOURS) 21 mg/24hr patch Place 1 patch (21 mg total) onto the skin daily.   propranolol (INDERAL) 10 MG tablet Take 1 tablet (10 mg total) by mouth as needed (Take before situations that cause anxiety.).    [DISCONTINUED] amoxicillin-clavulanate (AUGMENTIN) 875-125 MG tablet Take 1 tablet by mouth 2 (two) times daily. (Patient not taking: Reported on 08/13/2023)   [DISCONTINUED] lidocaine (XYLOCAINE) 2 % solution Use 15 mLs in the mouth or throat as directed as needed for mouth pain. (Patient not taking: Reported on 08/13/2023)   No facility-administered medications prior to visit.    ROS Negative unless otherwise noted in HPI   Objective:     BP 103/70 (BP Location: Left Arm, Patient Position: Sitting, Cuff Size: Normal)   Pulse 72   Resp 20   Ht 5' 1.5" (1.562 m)   Wt 118 lb (53.5 kg)   SpO2 100%   Breastfeeding No   BMI 21.93 kg/m   Physical Exam Constitutional:      General: She is not in acute distress.    Appearance: Normal appearance.  HENT:     Head: Normocephalic and atraumatic.  Pulmonary:     Effort: Pulmonary effort is normal. No respiratory distress.  Musculoskeletal:     Cervical back: Normal range of motion.  Neurological:     General: No focal deficit present.     Mental Status: She is alert and oriented to person, place, and time. Mental status is at baseline.  Psychiatric:        Mood and Affect: Mood normal.        Thought Content: Thought content normal.        Judgment: Judgment normal.  Assessment & Plan:  Weight loss, unintentional Assessment & Plan: Continue routine follow-up with psychiatry for management of Lexapro and Remeron.  Weight has increased 4 pounds since annual physical.  Will await results of gastroenterology visit as well.  In the meantime, continue prioritizing protein intake and adequate nutrition.   Dysmenorrhea Assessment & Plan: Discussed that given current timing and presentation, LLQ abdominal pain most likely mittelschmerz.  Will continue to monitor.  Reassuring that recent ultrasound did not show any structural abnormalities of the reproductive system.  Recommended to start Midol or ibuprofen 2-3 days before menstrual  cycle.  Patient verbalized understanding and is agreeable to this plan.     Return in about 6 months (around 02/24/2024) for follow-up for mood, GI issues, unintentional weight loss.    Melida Quitter, PA

## 2023-08-26 NOTE — Patient Instructions (Signed)
Good luck with your new job!  I hope everything goes well.  Keep an eye on the pain that you are having in your abdomen.  I included some information about mittelschmerz if you are interested in reading more.

## 2023-08-26 NOTE — Assessment & Plan Note (Signed)
Continue routine follow-up with psychiatry for management of Lexapro and Remeron.  Weight has increased 4 pounds since annual physical.  Will await results of gastroenterology visit as well.  In the meantime, continue prioritizing protein intake and adequate nutrition.

## 2023-08-26 NOTE — Assessment & Plan Note (Addendum)
Discussed that given current timing and presentation, LLQ abdominal pain most likely mittelschmerz.  Will continue to monitor.  Reassuring that recent ultrasound did not show any structural abnormalities of the reproductive system.  Recommended to start Midol or ibuprofen 2-3 days before menstrual cycle.  Patient verbalized understanding and is agreeable to this plan.

## 2023-09-02 ENCOUNTER — Encounter: Payer: Self-pay | Admitting: Gastroenterology

## 2023-09-03 ENCOUNTER — Ambulatory Visit: Payer: Medicaid Other | Admitting: Gastroenterology

## 2023-09-03 NOTE — Progress Notes (Unsigned)
HPI :    Past Medical History:  Diagnosis Date   Anxiety 2020   Bipolar 1 disorder (HCC) 2020   Cystic fibrosis carrier    Depression    no meds   Eczema    HIV (human immunodeficiency virus infection) (HCC) 04/2022   Incompetent cervix    Insomnia 2020   PTSD (post-traumatic stress disorder) 2020   Substance abuse (HCC)    alcohol, cocaine, marijuana   UTI (urinary tract infection)      Past Surgical History:  Procedure Laterality Date   CERVICAL CERCLAGE  06/06/2012   Procedure: CERCLAGE CERVICAL;  Surgeon: Bing Plume, MD;  Location: [facility];  Service: Gynecology;  Laterality: N/A;   LAPAROSCOPIC BILATERAL SALPINGECTOMY Bilateral 10/08/2022   Procedure: LAPAROSCOPIC BILATERAL SALPINGECTOMY;  Surgeon: Warden Fillers, MD;  Location: [facility] OR;  Service: Gynecology;  Laterality: Bilateral;   PLACEMENT OF BREAST IMPLANTS     WISDOM TOOTH EXTRACTION     Family History  Problem Relation Age of Onset   Bipolar disorder Mother        schizophrenic   Mental illness Mother        schizophrenia   Deep vein thrombosis Maternal Aunt    Alcohol abuse Maternal Grandmother    Mental illness Maternal Grandmother    Bipolar disorder Maternal Grandmother        schizophrenic    Cancer Maternal Grandmother        lymph nodes   Migraines Neg Hx    Headache Neg Hx    Social History   Tobacco Use   Smoking status: Every Day    Types: E-cigarettes    Last attempt to quit: 04/2022    Years since quitting: 1.3    Passive exposure: Current   Smokeless tobacco: Never  Vaping Use   Vaping status: Every Day   Substances: Nicotine  Substance Use Topics   Drug use: Not Currently    Frequency: 1.0 times per week    Types: Marijuana    Comment: last day 09/26/22-   Current Outpatient Medications  Medication Sig Dispense Refill   dolutegravir-lamiVUDine (DOVATO) 50-300 MG tablet Take 1 tablet by mouth daily. 30 tablet 5   escitalopram (LEXAPRO) 10 MG tablet Take 1 tablet (10 mg  total) by mouth daily. 30 tablet 1   mirtazapine (REMERON) 30 MG tablet Take 1 tablet (30 mg total) by mouth at bedtime. 30 tablet 1   nicotine (NICODERM CQ - DOSED IN MG/24 HOURS) 21 mg/24hr patch Place 1 patch (21 mg total) onto the skin daily. 30 patch 1   propranolol (INDERAL) 10 MG tablet Take 1 tablet (10 mg total) by mouth as needed (Take before situations that cause anxiety.). 30 tablet 1   No current facility-administered medications for this visit.   No Known Allergies   Review of Systems: All systems reviewed and negative except where noted in HPI.    No results found.  Physical Exam: There were no vitals taken for this visit. Constitutional: Pleasant,well-developed, ***female in no acute distress. HEENT: Normocephalic and atraumatic. Conjunctivae are normal. No scleral icterus. Neck supple.  Cardiovascular: Normal rate, regular rhythm.  Pulmonary/chest: Effort normal and breath sounds normal. No wheezing, rales or rhonchi. Abdominal: Soft, nondistended, nontender. Bowel sounds active throughout. There are no masses palpable. No hepatomegaly. Extremities: no edema Lymphadenopathy: No cervical adenopathy noted. Neurological: Alert and oriented to person place and time. Skin: Skin is warm and dry. No rashes noted. Psychiatric: Normal mood and  affect. Behavior is normal.  CBC    Component Value Date/Time   WBC 5.6 05/22/2023 1003   WBC 4.8 05/19/2023 1015   RBC 4.28 05/22/2023 1003   RBC 4.19 05/19/2023 1015   HGB 12.9 05/22/2023 1003   HGB 12.5 03/20/2022 0000   HCT 39.4 05/22/2023 1003   HCT 38 03/20/2022 0000   PLT 375 05/22/2023 1003   PLT 295 03/20/2022 0000   MCV 92 05/22/2023 1003   MCH 30.1 05/22/2023 1003   MCH 31.3 05/19/2023 1015   MCHC 32.7 05/22/2023 1003   MCHC 33.8 05/19/2023 1015   RDW 13.0 05/22/2023 1003   LYMPHSABS 2.8 01/21/2009 2100   MONOABS 0.8 01/21/2009 2100   EOSABS 0.1 01/21/2009 2100   BASOSABS 0.1 01/21/2009 2100    CMP      Component Value Date/Time   NA 138 05/22/2023 1003   K 4.6 05/22/2023 1003   CL 102 05/22/2023 1003   CO2 23 05/22/2023 1003   GLUCOSE 98 05/22/2023 1003   GLUCOSE 97 05/19/2023 1015   BUN 8 05/22/2023 1003   CREATININE 0.75 05/22/2023 1003   CREATININE 0.68 05/19/2023 1015   CALCIUM 9.4 05/22/2023 1003   PROT 6.7 05/22/2023 1003   ALBUMIN 4.5 05/22/2023 1003   AST 16 05/22/2023 1003   ALT 11 05/22/2023 1003   ALKPHOS 70 05/22/2023 1003   BILITOT 0.5 05/22/2023 1003   GFRNONAA >60 10/08/2022 1040   GFRAA 135 11/10/2019 0858       Latest Ref Rng & Units 05/22/2023   10:03 AM 05/19/2023   10:15 AM 10/08/2022   10:03 AM  CBC EXTENDED  WBC 3.4 - 10.8 x10E3/uL 5.6  4.8  6.0   RBC 3.77 - 5.28 x10E6/uL 4.28  4.19  4.15   Hemoglobin 11.1 - 15.9 g/dL 29.5  62.1  30.8   HCT 34.0 - 46.6 % 39.4  38.8  37.6   Platelets 150 - 450 x10E3/uL 375  415  356       ASSESSMENT AND PLAN:  Melida Quitter, PA

## 2023-09-09 ENCOUNTER — Other Ambulatory Visit (HOSPITAL_COMMUNITY): Payer: Self-pay

## 2023-09-09 ENCOUNTER — Other Ambulatory Visit: Payer: Self-pay

## 2023-09-09 NOTE — Progress Notes (Signed)
Clinical Intervention Note  Clinical Intervention Notes: Pt reports starting Mirtazapine & escitalopram. Per Up to Date, no DDI with Dovato. Closing Intervention.   Clinical Intervention Outcomes: Prevention of an adverse drug event   Bobette Mo Specialty Pharmacist

## 2023-09-09 NOTE — Progress Notes (Signed)
Specialty Pharmacy Refill Coordination Note  Nicole Morgan is a 27 y.o. female contacted today regarding refills of specialty medication(s) Dolutegravir-lamiVUDine (Dovato)   Patient requested Delivery   Delivery date: 09/11/23   Verified address: [address] [name] [address]   Medication will be filled on 09/10/23.

## 2023-09-10 ENCOUNTER — Other Ambulatory Visit: Payer: Self-pay

## 2023-09-12 ENCOUNTER — Other Ambulatory Visit (HOSPITAL_COMMUNITY): Payer: Self-pay

## 2023-09-15 ENCOUNTER — Ambulatory Visit (HOSPITAL_COMMUNITY): Payer: Medicaid Other | Admitting: Clinical

## 2023-09-25 ENCOUNTER — Other Ambulatory Visit (HOSPITAL_COMMUNITY): Payer: Self-pay

## 2023-09-25 ENCOUNTER — Other Ambulatory Visit: Payer: Self-pay

## 2023-09-25 MED ORDER — DOVATO 50-300 MG PO TABS
1.0000 | ORAL_TABLET | Freq: Every day | ORAL | 0 refills | Status: DC
  Filled 2023-09-25 – 2023-10-06 (×3): qty 30, 30d supply, fill #0

## 2023-10-01 ENCOUNTER — Ambulatory Visit: Payer: Medicaid Other | Admitting: Infectious Diseases

## 2023-10-03 ENCOUNTER — Other Ambulatory Visit: Payer: Self-pay

## 2023-10-03 ENCOUNTER — Other Ambulatory Visit (HOSPITAL_COMMUNITY): Payer: Self-pay

## 2023-10-06 ENCOUNTER — Other Ambulatory Visit: Payer: Self-pay

## 2023-10-06 ENCOUNTER — Telehealth (HOSPITAL_COMMUNITY): Payer: Self-pay

## 2023-10-06 ENCOUNTER — Other Ambulatory Visit (HOSPITAL_COMMUNITY): Payer: Self-pay

## 2023-10-06 NOTE — Progress Notes (Signed)
 Specialty Pharmacy Refill Coordination Note  Nicole Morgan is a 28 y.o. female contacted today regarding refills of specialty medication(s) Dolutegravir -lamiVUDine  (Dovato )   Patient requested Delivery   Delivery date: 10/13/23   Verified address: [address]   [place] [address]   Medication will be filled on 10/10/23.

## 2023-10-06 NOTE — Telephone Encounter (Signed)
 Medication Management.   Pt was last seen on 08/13/2023, next appointment on 1/15. Pt needs a refill of escitalopram (LEXAPRO) 10 MG tablet 10 mg, Daily.

## 2023-10-07 ENCOUNTER — Other Ambulatory Visit (HOSPITAL_COMMUNITY): Payer: Self-pay

## 2023-10-07 ENCOUNTER — Other Ambulatory Visit: Payer: Self-pay

## 2023-10-07 ENCOUNTER — Other Ambulatory Visit (HOSPITAL_COMMUNITY): Payer: Self-pay | Admitting: Psychiatry

## 2023-10-07 DIAGNOSIS — F322 Major depressive disorder, single episode, severe without psychotic features: Secondary | ICD-10-CM

## 2023-10-07 DIAGNOSIS — F4310 Post-traumatic stress disorder, unspecified: Secondary | ICD-10-CM

## 2023-10-07 DIAGNOSIS — F411 Generalized anxiety disorder: Secondary | ICD-10-CM

## 2023-10-07 MED ORDER — ESCITALOPRAM OXALATE 10 MG PO TABS
10.0000 mg | ORAL_TABLET | Freq: Every day | ORAL | 0 refills | Status: DC
  Filled 2023-10-07: qty 30, 30d supply, fill #0

## 2023-10-07 NOTE — Progress Notes (Signed)
 [facility] MD Outpatient Progress Note  10/08/2023 2:30 PM Nicole Morgan  MRN:  [id number]  Assessment:  Nicole Morgan presents for follow-up evaluation. Today, 10/08/23, patient reports improvement in her depression and anxiety symptoms. She reports decreased feelings of being overwhelmed, decreased feelings of guilt, improved sleep, decreased irritability (PHQ-9: 3) after starting the lexapro  and increasing her remeron . Socially, patient has also started a new job at [employer] in early December and she reports she feels supported by her workplace. She has a new stressor of recurrence of cancer in her grandma and she feels sad and anxious about this but it does not interfere with her social or occupational domains. She denies any SI/HI/AVH. She denies any NSSIB. She continues to use cannabis and vape. We discussed the impact this could have on her mental health and she is in the pre-contemplative stage regarding cessation of these substances. She reports unable to  follow-up with therapist appointment due to work and next scheduled therapy appointment would not be until April. Walk-in hours were discussed with patient regarding therapy appointments and also placed in patient instructions. We continued to discuss decreasing her caffeine intake.  Identifying Information: Nicole Morgan is a 28 y.o. female with a history of bipolar I who is an established patient with [facility] for management of anxiety.   Plan: # Major Depressive Disorder, single episode, in partial remission # Borderline personality disorder Past medication trials: seroquel , abilify  Status of problem: ongoing Interventions: -- Continue lexapro  10mg  daily for depressive symptoms  -- Continue remeron  30mg  at bedtime -- Next therapy appointment not until April, front desk discussed walk-in hours with patient -- Sent patient pdf of DBT handbook -- Continue to discourage self-harm as a coping mechanism -- Discussed  with patient continuing to cut down on caffeine including energy drinks   # Generalized anxiety disorder Past medication trials: propranolol  Status of problem: ongoing Interventions: -- Continue lexapro  10mg  daily for depression and anxiety  -- Continue remeron  30mg  at bedtime -- Continue propranolol  from PCP PRN for social anxiety   # PTSD Past medication trials: none Status of problem: ongoing Interventions: -- Referred patient to therapy    # Cocaine use disorder, in sustained remission Past medication trials: none Status of problem: ongoing Interventions: -- Continue to encourage cessation   # Tobacco use disorder Past medication trials: nicotine  patch Status of problem: ongoing Interventions: -- Continue to encourage cessation   # Cannabis use disorder Past medication trials: none Status of problem: ongoing Interventions: -- Continue to encourage cessation  Patient was given contact information for behavioral health clinic and was instructed to call 911 for emergencies.   Return to clinic 11/26/23.   Subjective:  Chief Complaint: "I'm feeling better"  Interval History:  --12/3 PCP appt: noticing getting tired later in the day, noticed appetite improvement. Re dysmenorrhea, PCP thinks likely mittelschmerz, was rec to start midol  or ibuprofen  2-3 days before menstrual cycle  Patient is seen in the office. She is alert, oriented. She has a euthymic affect. Patient reports she has noticed difference in her mood with starting lexapro , noticed being able to process better. Feel like things are not as big of a deal. She states that her son even notices a difference in her and will ask her if she is taking her medications. Feel less stressed, feel like able to do more problem-solving and handle stuff better. She reports she is making lists. Feels more "normal." She reports she is ruminating less. She  reports when she started taking it initially, she felt a little hazy and more  forgetful initially. Didn't take it for 2 days due to not having refill and she reports she got a little shaky and sweating, had more anxiety symptoms and crying spells. She has since restarted it and is currently feeling a little weird with restarting it, feeling a little spacy, feeling something slipped her mind but she states that this happened when she restarted lexapro  last time and it improved in a couple of days. She did notice a month after using the lexapro  that she was a little more easily irritable but does not currently feel that way.   Given her past historical diagnosis of bipolar, I asked patient about signs and symptoms associated with mania. Patient denies any increased impulsivity or decreased sleep. She reports decreased impulsivity, reports she is not blowing up as much as before and she is trying to distance herself from negativity. She reports most days she is going to sleep between 9-11pm and waking up at 6am. She feels like she has goals and is going in the right direction. She denies increased racing thoughts or increased energy. She overall feels like she is just able to see more positive in the world.   She reports feeling like she has increased appetite and is now eating breakfast which she did not do before. She report she is still drinking red bulls in the morning and throughout the day but she has cut down on her coffee intake. She reports she previously would have a pot of coffee vs now she will only have 2 cups in the morning.   Reports wants virtual therapy appointment. Reports it might have been work that caused her to miss the previous appointment.   Reports she is still smoking weed. Still smoking 1g/day. Reports she is still vaping, 1 cartridge once every 2 days. Reports she has very vivid dreams.   She denies SI/HI/AVH.   PHQ-9: 3 GAD-7: 4  Visit Diagnosis:    ICD-10-CM   1. Major depressive disorder with single episode, in partial remission (HCC)  F32.4  mirtazapine  (REMERON ) 30 MG tablet    escitalopram  (LEXAPRO ) 10 MG tablet    2. PTSD (post-traumatic stress disorder)  F43.10 escitalopram  (LEXAPRO ) 10 MG tablet    3. GAD (generalized anxiety disorder)  F41.1 mirtazapine  (REMERON ) 30 MG tablet    escitalopram  (LEXAPRO ) 10 MG tablet    4. Tobacco use disorder  F17.200     5. Cannabis use disorder  F12.90      Past Psychiatric History:  Diagnoses: anxiety, depression, PTSD, bipolar I Medication trials: propranolol  10 PRN for anxiety, abilify  15, elavil  10 at bedtime, gabapentin  600 TID, seroquel  250 for insomnia   Previous psychiatrist/therapist: went to [facility] before but then would use again Hospitalizations: at 28 y/o (reports didn't want to be at home and wanted to run away) and 4 years ago for self-harm (scratched self) Suicide attempts: denies SIB: cut or burn self, ripped hair out, scratched self to the point left bruises. Reports last time was beginning of July.   Hx of violence towards others: none Current access to guns: denies  Hx of trauma/abuse: Reports hard time being around guns due to past trauma.   Past Medical History:  Past Medical History:  Diagnosis Date   Anxiety 2020   Bipolar 1 disorder (HCC) 2020   Cystic fibrosis carrier    Depression    no meds   Eczema  HIV (human immunodeficiency virus infection) (HCC) 04/2022   Incompetent cervix    Insomnia 2020   PTSD (post-traumatic stress disorder) 2020   Substance abuse (HCC)    alcohol, cocaine, marijuana   UTI (urinary tract infection)     Past Surgical History:  Procedure Laterality Date   CERVICAL CERCLAGE  06/06/2012   Procedure: CERCLAGE CERVICAL;  Surgeon: Romilda Coaster, MD;  Location: [facility];  Service: Gynecology;  Laterality: N/A;   LAPAROSCOPIC BILATERAL SALPINGECTOMY Bilateral 10/08/2022   Procedure: LAPAROSCOPIC BILATERAL SALPINGECTOMY;  Surgeon: Abigail Abler, MD;  Location: [facility] OR;  Service: Gynecology;  Laterality: Bilateral;    PLACEMENT OF BREAST IMPLANTS     WISDOM TOOTH EXTRACTION     Family Psychiatric History:  Alcohol abuse in her maternal grandmother; Bipolar disorder in her mother;    Family History:  Family History  Problem Relation Age of Onset   Bipolar disorder Mother        schizophrenic   Mental illness Mother        schizophrenia   Deep vein thrombosis Maternal Aunt    Alcohol abuse Maternal Grandmother    Mental illness Maternal Grandmother    Bipolar disorder Maternal Grandmother        schizophrenic    Cancer Maternal Grandmother        lymph nodes   Migraines Neg Hx    Headache Neg Hx     Social History:  Academic/Vocational: Works as a Lawyer.  -Has daughter (10 months) and son (10-11) -Daughter's dad not around, "he did me wrong" -Has good relationship with son's father  -Currently her father is helping out with daughter  Social History   Socioeconomic History   Marital status: Single    Spouse name: Not on file   Number of children: 1   Years of education: Not on file   Highest education level: Some college, no degree  Occupational History    Employer: [employer]  Tobacco Use   Smoking status: Every Day    Types: E-cigarettes    Last attempt to quit: 04/2022    Years since quitting: 1.4    Passive exposure: Current   Smokeless tobacco: Never  Vaping Use   Vaping status: Every Day   Substances: Nicotine   Substance and Sexual Activity   Alcohol use: Not on file   Drug use: Not Currently    Frequency: 1.0 times per week    Types: Marijuana    Comment: last day 09/26/22-   Sexual activity: Not Currently  Other Topics Concern   Not on file  Social History Narrative   Lives with child    Right handed   Caffeine: about 3 cups/day maybe more   Social Drivers of Corporate investment banker Strain: Medium Risk (05/16/2022)   Overall Financial Resource Strain (CARDIA)    Difficulty of Paying Living Expenses: Somewhat hard  Food Insecurity: No Food Insecurity  (09/30/2022)   Hunger Vital Sign    Worried About Running Out of Food in the Last Year: Never true    Ran Out of Food in the Last Year: Never true  Recent Concern: Food Insecurity - Food Insecurity Present (07/24/2022)   Hunger Vital Sign    Worried About Running Out of Food in the Last Year: Sometimes true    Ran Out of Food in the Last Year: Never true  Transportation Needs: No Transportation Needs (09/30/2022)   PRAPARE - Administrator, Civil Service (Medical):  No    Lack of Transportation (Non-Medical): No  Physical Activity: Sufficiently Active (05/16/2022)   Exercise Vital Sign    Days of Exercise per Week: 7 days    Minutes of Exercise per Session: 30 min  Stress: Stress Concern Present (05/16/2022)   [employer] of Occupational Health - Occupational Stress Questionnaire    Feeling of Stress : Very much  Social Connections: Unknown (01/17/2023)   Received from [facility]   Social Network    Social Network: Not on file    Allergies: No Known Allergies  Current Medications: Current Outpatient Medications  Medication Sig Dispense Refill   dolutegravir -lamiVUDine  (DOVATO ) 50-300 MG tablet Take 1 tablet by mouth daily. 30 tablet 0   escitalopram  (LEXAPRO ) 10 MG tablet Take 1 tablet (10 mg total) by mouth daily. 30 tablet 1   mirtazapine  (REMERON ) 30 MG tablet Take 1 tablet (30 mg total) by mouth at bedtime. 30 tablet 1   nicotine  (NICODERM CQ  - DOSED IN MG/24 HOURS) 21 mg/24hr patch Place 1 patch (21 mg total) onto the skin daily. (Patient not taking: Reported on 10/08/2023) 30 patch 1   propranolol  (INDERAL ) 10 MG tablet Take 1 tablet (10 mg total) by mouth as needed (Take before situations that cause anxiety.). 30 tablet 1   No current facility-administered medications for this visit.    ROS: Review of Systems  HENT: Negative.    Eyes: Negative.   Respiratory: Negative.    Cardiovascular: Negative.   Endocrine: Negative.   Genitourinary:  Negative.   Musculoskeletal: Negative.   Skin: Negative.   Allergic/Immunologic: Negative.   Neurological: Negative.   Hematological: Negative.   Psychiatric/Behavioral: Euthymic mood. Reports improvement in anxiety and depression.  Objective:  Psychiatric Specialty Exam: Blood pressure (!) 130/94, pulse 87, weight 112 lb 3.2 oz (50.9 kg), SpO2 99%, not currently breastfeeding.Body mass index is 20.86 kg/m.  General Appearance: Fairly Groomed  Eye Contact:  Good  Speech:  Clear and Coherent  Volume:  Normal  Mood:  Euthymic  Affect:  Appropriate  Thought Content: Logical   Suicidal Thoughts:  No  Homicidal Thoughts:  No  Thought Process:  Goal Directed  Orientation:  Full (Time, Place, and Person)    Memory: Grossly intact   Judgment:  Good  Insight:  Good  Concentration:  Concentration: Good  Recall: not formally assessed   Fund of Knowledge: Good  Language: Good  Psychomotor Activity:  Normal  Akathisia:  No  AIMS (if indicated): not done  Assets:  Communication Skills Desire for Improvement Housing Intimacy Social Support  ADL's:  Intact  Cognition: WNL  Sleep:  Fair   PE: General: well-appearing; no acute distress  Pulm: no increased work of breathing on room air  Strength & Muscle Tone: within normal limits Neuro: no focal neurological deficits observed  Gait & Station: normal  Metabolic Disorder Labs: Lab Results  Component Value Date   HGBA1C 5.3 05/22/2023   MPG 108 05/19/2023   No results found for: "PROLACTIN" Lab Results  Component Value Date   CHOL 148 05/22/2023   TRIG 80 05/22/2023   HDL 62 05/22/2023   CHOLHDL 2.4 05/22/2023   LDLCALC 71 05/22/2023   LDLCALC 59 05/19/2023   Lab Results  Component Value Date   TSH 0.475 05/22/2023   TSH 0.372 (L) 11/10/2019    Therapeutic Level Labs: No results found for: "LITHIUM" No results found for: "VALPROATE" No results found for: "CBMZ"  Screenings:  GAD-7  Flowsheet Row Office  Visit from 07/17/2023 in [facility] Office Visit from 05/27/2023 in [facility] at [facility] Office Visit from 11/08/2022 in [facility] Visit from 10/30/2022 in [facility] at [facility] Office Visit from 09/30/2022 in [facility] at [facility]  Total GAD-7 Score 9 15 0 0 3      PHQ2-9    Flowsheet Row Clinical Support from 08/13/2023 in [facility] Office Visit from 07/17/2023 in [facility] at [facility] Office Visit from 05/28/2023 in [facility] Office Visit from 05/27/2023 in [facility] at [facility] Office Visit from 05/19/2023 in [facility] [place]  PHQ-2 Total Score 5 0 2 2 1   PHQ-9 Total Score 16 5 9 6  --      Flowsheet Row ED from 12/24/2022 in [facility] at [facility] Admission (Discharged) from 10/08/2022 in [place] [facility]IOPER[facility]IVE [facility] ED from 08/18/2022 in [facility] at [facility] RISK CATEGORY No Risk No Risk No Risk       Collaboration of Care: Collaboration of Care: Medication Management AEB Dr. Eligio Grumbling  Patient/Guardian was advised Release of Information must be obtained prior to any record release in order to collaborate their care with an outside provider. Patient/Guardian was advised if they have not already done so to contact the registration department to sign all necessary forms in order for us  to release information regarding their care.   Consent: Patient/Guardian gives verbal consent for treatment and assignment of benefits for services provided during this visit. Patient/Guardian expressed understanding and agreed to proceed.   A total of 60 minutes was spent involved in face to face clinical care,  chart review, documentation.  Norbert Bean, MD, PGY-2 10/08/2023, 2:30 PM

## 2023-10-08 ENCOUNTER — Ambulatory Visit (HOSPITAL_COMMUNITY): Payer: Medicaid Other | Admitting: Psychiatry

## 2023-10-08 ENCOUNTER — Other Ambulatory Visit (HOSPITAL_COMMUNITY): Payer: Self-pay

## 2023-10-08 VITALS — BP 130/94 | HR 87 | Wt 112.2 lb

## 2023-10-08 DIAGNOSIS — F324 Major depressive disorder, single episode, in partial remission: Secondary | ICD-10-CM | POA: Diagnosis not present

## 2023-10-08 DIAGNOSIS — F4310 Post-traumatic stress disorder, unspecified: Secondary | ICD-10-CM | POA: Diagnosis not present

## 2023-10-08 DIAGNOSIS — F411 Generalized anxiety disorder: Secondary | ICD-10-CM | POA: Diagnosis not present

## 2023-10-08 DIAGNOSIS — F17200 Nicotine dependence, unspecified, uncomplicated: Secondary | ICD-10-CM | POA: Diagnosis not present

## 2023-10-08 DIAGNOSIS — F1290 Cannabis use, unspecified, uncomplicated: Secondary | ICD-10-CM

## 2023-10-08 DIAGNOSIS — F322 Major depressive disorder, single episode, severe without psychotic features: Secondary | ICD-10-CM

## 2023-10-08 MED ORDER — MIRTAZAPINE 30 MG PO TABS
30.0000 mg | ORAL_TABLET | Freq: Every day | ORAL | 1 refills | Status: DC
  Filled 2023-10-08: qty 30, 30d supply, fill #0
  Filled 2023-12-05: qty 30, 30d supply, fill #1

## 2023-10-08 MED ORDER — ESCITALOPRAM OXALATE 10 MG PO TABS
10.0000 mg | ORAL_TABLET | Freq: Every day | ORAL | 1 refills | Status: DC
  Filled 2023-10-08 – 2023-10-30 (×2): qty 30, 30d supply, fill #0
  Filled 2023-11-04 – 2023-11-21 (×2): qty 30, 30d supply, fill #1

## 2023-10-08 NOTE — Patient Instructions (Addendum)
 Please continue your lexapro  10mg  daily and remeron  30mg  nightly  Please follow-up with therapy walk-ins.   Walk-in for therapist for therapy:  Monday (female) & Wednesdays-Thursdays (female): Please ARRIVE at 7:00 AM for registration Will START at 8:00 AM Every 1st & 2nd Friday of the month: Please ARRIVE at 7:00 AM for registration Will START at 1 PM - 5 PM (virtual)

## 2023-10-09 ENCOUNTER — Encounter: Payer: Medicaid Other | Attending: Obstetrics and Gynecology | Admitting: Physical Therapy

## 2023-10-09 ENCOUNTER — Ambulatory Visit (INDEPENDENT_AMBULATORY_CARE_PROVIDER_SITE_OTHER): Payer: Medicaid Other | Admitting: Infectious Diseases

## 2023-10-09 ENCOUNTER — Other Ambulatory Visit: Payer: Self-pay

## 2023-10-09 ENCOUNTER — Encounter: Payer: Self-pay | Admitting: Infectious Diseases

## 2023-10-09 ENCOUNTER — Other Ambulatory Visit (HOSPITAL_COMMUNITY): Payer: Self-pay

## 2023-10-09 ENCOUNTER — Telehealth: Payer: Self-pay | Admitting: Physical Therapy

## 2023-10-09 VITALS — BP 102/71 | HR 86 | Temp 98.3°F | Resp 16 | Wt 114.0 lb

## 2023-10-09 DIAGNOSIS — Z21 Asymptomatic human immunodeficiency virus [HIV] infection status: Secondary | ICD-10-CM

## 2023-10-09 DIAGNOSIS — B20 Human immunodeficiency virus [HIV] disease: Secondary | ICD-10-CM | POA: Diagnosis present

## 2023-10-09 DIAGNOSIS — R634 Abnormal weight loss: Secondary | ICD-10-CM

## 2023-10-09 DIAGNOSIS — F411 Generalized anxiety disorder: Secondary | ICD-10-CM

## 2023-10-09 MED ORDER — DOVATO 50-300 MG PO TABS
1.0000 | ORAL_TABLET | Freq: Every day | ORAL | 11 refills | Status: DC
  Filled 2023-10-09: qty 30, 30d supply, fill #0
  Filled 2023-11-18: qty 30, 30d supply, fill #1
  Filled 2023-12-29: qty 30, 30d supply, fill #2
  Filled 2024-01-26: qty 30, 30d supply, fill #3
  Filled 2024-03-04: qty 30, 30d supply, fill #4
  Filled 2024-03-31: qty 30, 30d supply, fill #5
  Filled 2024-05-03 – 2024-05-14 (×2): qty 30, 30d supply, fill #6
  Filled 2024-06-07: qty 30, 30d supply, fill #7
  Filled 2024-07-13: qty 30, 30d supply, fill #8
  Filled 2024-08-05 – 2024-09-06 (×3): qty 30, 30d supply, fill #9
  Filled 2024-09-30: qty 30, 30d supply, fill #10

## 2023-10-09 NOTE — Progress Notes (Signed)
Name: Nicole Morgan  DOB: [birth year] MRN: [id number] PCP: Melida Quitter, PA     Brief Narrative:  Nicole Morgan is a 28 y.o. female with HIV diagnosis 04/29/22 from [facility] as part of contact tracing event HIV Risk: sexual History of OIs:  Intake Labs: Hep B sAg (-), sAb (-), cAb (); Hep A (), Hep C (-) Quantiferon (-)   Previous Regimens: Tiv[place]ay + Descovy 04-2022  Genotypes: Not collected.   Subjective   Subjective:   Chief Complaint  Patient presents with   Follow-up    B20      HPI: Nicole Morgan is here for routine follow up. 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 She enjoys her work. Her daughter is 69 year old and keeps her busy.   She has not had any problems with her Dovato and continues to take this everyday. She has no side effects to her medication and would like to continue this.   She still feels like she could do for some weight gain. She is following with psychiatry team and started on lexapro with good results 2 months ago now.    Wt Readings from Last 3 Encounters:  10/09/23 114 lb (51.7 kg)  08/26/23 118 lb (53.5 kg)  07/17/23 118 lb 3.2 oz (53.6 kg)       10/09/2023    2:09 PM  Depression screen PHQ 2/9  Decreased Interest 0  Down, Depressed, Hopeless 0  PHQ - 2 Score 0     Review of Systems  Constitutional:  Negative for chills, fever, malaise/fatigue and weight loss.  HENT:  Negative for sore throat.   Respiratory:  Negative for cough, sputum production and shortness of breath.   Cardiovascular: Negative.   Gastrointestinal:  Negative for abdominal pain, diarrhea and vomiting.  Musculoskeletal:  Negative for joint pain, myalgias and neck pain.  Skin:  Negative for rash.  Neurological:  Negative for headaches.  Psychiatric/Behavioral:  Negative for depression and substance abuse. The patient is not nervous/anxious.      Past Medical History:  Diagnosis Date   Anxiety 2020   Bipolar 1 disorder (HCC) 2020   Cystic  fibrosis carrier    Depression    no meds   Eczema    HIV (human immunodeficiency virus infection) (HCC) 04/2022   Incompetent cervix    Insomnia 2020   PTSD (post-traumatic stress disorder) 2020   Substance abuse (HCC)    alcohol, cocaine, marijuana   UTI (urinary tract infection)     Outpatient Medications Prior to Visit  Medication Sig Dispense Refill   escitalopram (LEXAPRO) 10 MG tablet Take 1 tablet (10 mg total) by mouth daily. 30 tablet 1   mirtazapine (REMERON) 30 MG tablet Take 1 tablet (30 mg total) by mouth at bedtime. 30 tablet 1   propranolol (INDERAL) 10 MG tablet Take 1 tablet (10 mg total) by mouth as needed (Take before situations that cause anxiety.). 30 tablet 1   dolutegravir-lamiVUDine (DOVATO) 50-300 MG tablet Take 1 tablet by mouth daily. 30 tablet 0   nicotine (NICODERM CQ - DOSED IN MG/24 HOURS) 21 mg/24hr patch Place 1 patch (21 mg total) onto the skin daily. (Patient not taking: Reported on 10/09/2023) 30 patch 1   No facility-administered medications prior to visit.     No Known Allergies  Social History   Tobacco Use   Smoking status: Every Day    Types: E-cigarettes    Last attempt to quit:  04/2022    Years since quitting: 1.4    Passive exposure: Current   Smokeless tobacco: Never  Vaping Use   Vaping status: Every Day   Substances: Nicotine  Substance Use Topics   Alcohol use: Not Currently   Drug use: Not Currently    Frequency: 1.0 times per week    Types: Marijuana    Comment: last day 09/26/22-    Social History   Substance and Sexual Activity  Sexual Activity Not Currently     Objective   Objective:   Vitals:   10/09/23 1407  BP: 102/71  Pulse: 86  Resp: 16  Temp: 98.3 F (36.8 C)  TempSrc: Temporal  SpO2: 97%  Weight: 114 lb (51.7 kg)   Body mass index is 21.19 kg/m.  Physical Exam Constitutional:      Appearance: Normal appearance. She is not ill-appearing.  HENT:     Mouth/Throat:     Mouth: Mucous  membranes are moist.     Pharynx: Oropharynx is clear.  Eyes:     General: No scleral icterus. Cardiovascular:     Rate and Rhythm: Normal rate and regular rhythm.  Pulmonary:     Effort: Pulmonary effort is normal.  Neurological:     Mental Status: She is oriented to person, place, and time.  Psychiatric:        Mood and Affect: Mood normal.        Thought Content: Thought content normal.     Lab Results Lab Results  Component Value Date   WBC 5.6 05/22/2023   HGB 12.9 05/22/2023   HCT 39.4 05/22/2023   MCV 92 05/22/2023   PLT 375 05/22/2023    Lab Results  Component Value Date   CREATININE 0.75 05/22/2023   BUN 8 05/22/2023   NA 138 05/22/2023   K 4.6 05/22/2023   CL 102 05/22/2023   CO2 23 05/22/2023    Lab Results  Component Value Date   ALT 11 05/22/2023   AST 16 05/22/2023   ALKPHOS 70 05/22/2023   BILITOT 0.5 05/22/2023    Lab Results  Component Value Date   CHOL 148 05/22/2023   HDL 62 05/22/2023   LDLCALC 71 05/22/2023   TRIG 80 05/22/2023   CHOLHDL 2.4 05/22/2023   HIV 1 RNA Quant (Copies/mL)  Date Value  05/19/2023 Not Detected  12/09/2022 Not Detected  11/04/2022 Not Detected   CD4 T Cell Abs (/uL)  Date Value  05/19/2023 890  11/04/2022 1,148  05/14/2022 1,287      Assessment & Plan:   Problem List Items Addressed This Visit       Unprioritized   GAD (generalized anxiety disorder)   Appreciate care BH team has offered her. Lexapro seems to be working well for her.       HIV (human immunodeficiency virus infection) (HCC) - Primary   Very well controlled on once daily Dovato. No concerns with access or adherence to medication. They are tolerating the medication well without side effects. No drug interactions identified. Pertinent lab tests ordered today.  No changes to insurance coverage.  No dental needs today.  No concern over anxious/depressed mood.  Sexual health and family planning discussed - pap smear due 2026. S/P BTL  and no further family planning.  Vaccines deferred.   Return in about 6 months (around 04/07/2024).        Relevant Medications   dolutegravir-lamiVUDine (DOVATO) 50-300 MG tablet   Other Relevant Orders   HIV 1  RNA quant-no reflex-bld   T-helper cells (CD4) count   Weight loss, unintentional   Relatively stable since August. 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 Wants to see how things go on Lexapro without any other intervention at this time.         Meds ordered this encounter  Medications   dolutegravir-lamiVUDine (DOVATO) 50-300 MG tablet    Sig: Take 1 tablet by mouth daily.    Dispense:  30 tablet    Refill:  11    Prescription Type::   Renewal   Orders Placed This Encounter  Procedures   HIV 1 RNA quant-no reflex-bld   T-helper cells (CD4) count   Return in about 6 months (around 04/07/2024).   Rexene Alberts, MSN, NP-C [facility] Infectious [facility] Pager: [phone] Office: [phone]  10/09/23  2:36 PM

## 2023-10-09 NOTE — Assessment & Plan Note (Signed)
Very well controlled on once daily Dovato. No concerns with access or adherence to medication. They are tolerating the medication well without side effects. No drug interactions identified. Pertinent lab tests ordered today.  No changes to insurance coverage.  No dental needs today.  No concern over anxious/depressed mood.  Sexual health and family planning discussed - pap smear due 2026. S/P BTL and no further family planning.  Vaccines deferred.   Return in about 6 months (around 04/07/2024).

## 2023-10-09 NOTE — Telephone Encounter (Signed)
Called patient about her missed appointment today at 16:00. Left a message.  Eulis Foster, PT @1 /16/25@ 4:47 PM

## 2023-10-09 NOTE — Assessment & Plan Note (Signed)
Relatively stable since August. 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 Wants to see how things go on Lexapro without any other intervention at this time.

## 2023-10-09 NOTE — Therapy (Deleted)
OUTPATIENT PHYSICAL THERAPY FEMALE PELVIC EVALUATION   Patient Name: Nicole Morgan MRN: [id number] DOB:Jun 14, 1996, 28 y.o., female Today's Date: 10/09/2023  END OF SESSION:   Past Medical History:  Diagnosis Date   Anxiety 2020   Bipolar 1 disorder (HCC) 2020   Cystic fibrosis carrier    Depression    no meds   Eczema    HIV (human immunodeficiency virus infection) (HCC) 04/2022   Incompetent cervix    Insomnia 2020   PTSD (post-traumatic stress disorder) 2020   Substance abuse (HCC)    alcohol, cocaine, marijuana   UTI (urinary tract infection)    Past Surgical History:  Procedure Laterality Date   CERVICAL CERCLAGE  06/06/2012   Procedure: CERCLAGE CERVICAL;  Surgeon: Bing Plume, MD;  Location: [facility];  Service: Gynecology;  Laterality: N/A;   LAPAROSCOPIC BILATERAL SALPINGECTOMY Bilateral 10/08/2022   Procedure: LAPAROSCOPIC BILATERAL SALPINGECTOMY;  Surgeon: Warden Fillers, MD;  Location: [facility] OR;  Service: Gynecology;  Laterality: Bilateral;   PLACEMENT OF BREAST IMPLANTS     WISDOM TOOTH EXTRACTION     Patient Active Problem List   Diagnosis Date Noted   Dysmenorrhea 08/26/2023   Borderline personality disorder (HCC) 05/28/2023   Cocaine use disorder, moderate, in sustained remission (HCC) 05/28/2023   Tobacco use disorder 05/28/2023   Cannabis use disorder 05/28/2023   Weight loss, unintentional 05/20/2023   Rectal bleeding 05/20/2023   GAD (generalized anxiety disorder) 11/08/2022   PTSD (post-traumatic stress disorder) 11/08/2022   HIV (human immunodeficiency virus infection) (HCC) 05/01/2022   Cystic fibrosis carrier 04/09/2022   History of substance use disorder 04/08/2021    PCP: Melida Quitter, PA  REFERRING PROVIDER: Sue Lush, FNP   REFERRING DIAG:  N94.10 (ICD-10-CM) - Dyspareunia in female  R10.2 (ICD-10-CM) - Pelvic pain    THERAPY DIAG:  No diagnosis found.  Rationale for Evaluation and Treatment:  Rehabilitation  ONSET DATE: ***  SUBJECTIVE:                                                                                                                                                                                           SUBJECTIVE STATEMENT: *** Fluid intake: {Yes/No:304960894}   PAIN:  Are you having pain? {yes/no:20286} NPRS scale: ***/10 Pain location: {pelvic pain location:27098}  Pain type: {type:313116} Pain description: {PAIN DESCRIPTION:21022940}   Aggravating factors: *** Relieving factors: ***  PRECAUTIONS: None  RED FLAGS: {PT Red Flags:29287}   WEIGHT BEARING RESTRICTIONS: No  FALLS:  Has patient fallen in last 6 months? {fallsyesno:27318}  LIVING ENVIRONMENT: Lives with: {[facility] lives with:25569::"lives with their family"} Lives in: {Lives in:25570}  Stairs: {opstairs:27293} Has following equipment at home: {Assistive devices:23999}  OCCUPATION: ***  PLOF: {PLOF:24004}  PATIENT GOALS: ***  PERTINENT HISTORY:  Bilateral salpingectomy 09/2022; HIV; PTSD; Eczema Sexual abuse: {Yes/No:304960894}  BOWEL MOVEMENT: Pain with bowel movement: {yes/no:20286} Type of bowel movement:{PT BM type:27100} Fully empty rectum: {Yes/No:304960894} Leakage: {Yes/No:304960894} Pads: {Yes/No:304960894} Fiber supplement: {Yes/No:304960894}  URINATION: Pain with urination: {yes/no:20286} Fully empty bladder: {Yes/No:304960894} Stream: {PT urination:27102} Urgency: {Yes/No:304960894} Frequency: *** Leakage: {PT leakage:27103} Pads: {Yes/No:304960894}  INTERCOURSE: Pain with intercourse: {pain with intercourse PA:27099} Ability to have vaginal penetration:  {Yes/No:304960894} Climax: *** Marinoff Scale: ***/3  PREGNANCY: Vaginal deliveries *** Tearing {Yes***/No:304960894} C-section deliveries *** Currently pregnant {Yes***/No:304960894}  PROLAPSE: {PT prolapse:27101}   OBJECTIVE:  Note: Objective measures were completed at Evaluation unless  otherwise noted.  DIAGNOSTIC FINDINGS:  ***  PATIENT SURVEYS:  {rehab surveys:24030}  PFIQ-7 ***  COGNITION: Overall cognitive status: {cognition:24006}     SENSATION: Light touch: {intact/deficits:24005} Proprioception: {intact/deficits:24005}  MUSCLE LENGTH: Hamstrings: Right *** deg; Left *** deg Thomas test: Right *** deg; Left *** deg  LUMBAR SPECIAL TESTS:  {lumbar special test:25242}  FUNCTIONAL TESTS:  {Functional tests:24029}  GAIT: Distance walked: *** Assistive device utilized: {Assistive devices:23999} Level of assistance: {Levels of assistance:24026} Comments: ***  POSTURE: {posture:25561}  PELVIC ALIGNMENT:  LUMBARAROM/PROM:  A/PROM A/PROM  eval  Flexion   Extension   Right lateral flexion   Left lateral flexion   Right rotation   Left rotation    (Blank rows = not tested)  LOWER EXTREMITY ROM:  {AROM/PROM:27142} ROM Right eval Left eval  Hip flexion    Hip extension    Hip abduction    Hip adduction    Hip internal rotation    Hip external rotation    Knee flexion    Knee extension    Ankle dorsiflexion    Ankle plantarflexion    Ankle inversion    Ankle eversion     (Blank rows = not tested)  LOWER EXTREMITY MMT:  MMT Right eval Left eval  Hip flexion    Hip extension    Hip abduction    Hip adduction    Hip internal rotation    Hip external rotation    Knee flexion    Knee extension    Ankle dorsiflexion    Ankle plantarflexion    Ankle inversion    Ankle eversion     PALPATION:   General  ***                External Perineal Exam ***                             Internal Pelvic Floor ***  Patient confirms identification and approves PT to assess internal pelvic floor and treatment {yes/no:20286}  PELVIC MMT:   MMT eval  Vaginal   Internal Anal Sphincter   External Anal Sphincter   Puborectalis   Diastasis Recti   (Blank rows = not tested)        TONE: ***  PROLAPSE: ***  TODAY'S TREATMENT:  DATE: ***  EVAL ***   PATIENT EDUCATION:  Education details: *** Person educated: {Person educated:25204} Education method: {Education Method:25205} Education comprehension: {Education Comprehension:25206}  HOME EXERCISE PROGRAM: ***  ASSESSMENT:  CLINICAL IMPRESSION: Patient is a *** y.o. *** who was seen today for physical therapy evaluation and treatment for ***.   OBJECTIVE IMPAIRMENTS: {opptimpairments:25111}.   ACTIVITY LIMITATIONS: {activitylimitations:27494}  PARTICIPATION LIMITATIONS: {participationrestrictions:25113}  PERSONAL FACTORS: {Personal factors:25162} are also affecting patient's functional outcome.   REHAB POTENTIAL: {rehabpotential:25112}  CLINICAL DECISION MAKING: {clinical decision making:25114}  EVALUATION COMPLEXITY: {Evaluation complexity:25115}   GOALS: Goals reviewed with patient? {yes/no:20286}  SHORT TERM GOALS: Target date: ***  *** Baseline: Goal status: INITIAL  2.  *** Baseline:  Goal status: INITIAL  3.  *** Baseline:  Goal status: INITIAL  4.  *** Baseline:  Goal status: INITIAL  5.  *** Baseline:  Goal status: INITIAL  6.  *** Baseline:  Goal status: INITIAL  LONG TERM GOALS: Target date: ***  *** Baseline:  Goal status: INITIAL  2.  *** Baseline:  Goal status: INITIAL  3.  *** Baseline:  Goal status: INITIAL  4.  *** Baseline:  Goal status: INITIAL  5.  *** Baseline:  Goal status: INITIAL  6.  *** Baseline:  Goal status: INITIAL  PLAN:  PT FREQUENCY: {rehab frequency:25116}  PT DURATION: {rehab duration:25117}  PLANNED INTERVENTIONS: {rehab planned interventions:25118::"97110-Therapeutic exercises","97530- Therapeutic [phone]- Neuromuscular re-education","97535- Self QMVH","84696- Manual therapy"}  PLAN FOR NEXT SESSION: ***   Arlynn Stare,  PT 10/09/2023, 12:48 PM

## 2023-10-09 NOTE — Patient Instructions (Addendum)
No changes from me   Pap smear will be due in 2026.   [facility] [employer] - sell them at [employer] by the labs on your way out

## 2023-10-09 NOTE — Assessment & Plan Note (Signed)
Appreciate care [facility] team has offered her. Lexapro seems to be working well for her.

## 2023-10-10 ENCOUNTER — Other Ambulatory Visit: Payer: Self-pay

## 2023-10-12 LAB — HIV-1 RNA QUANT-NO REFLEX-BLD
HIV 1 RNA Quant: NOT DETECTED {copies}/mL
HIV-1 RNA Quant, Log: NOT DETECTED {Log}

## 2023-10-12 LAB — T-HELPER CELLS (CD4) COUNT (NOT AT ARMC)
Absolute CD4: 1237 {cells}/uL (ref 490–1740)
CD4 T Helper %: 63 % — ABNORMAL HIGH (ref 30–61)
Total lymphocyte count: 1976 {cells}/uL (ref 850–3900)

## 2023-10-18 ENCOUNTER — Other Ambulatory Visit (HOSPITAL_COMMUNITY): Payer: Self-pay

## 2023-10-20 NOTE — Therapy (Deleted)
OUTPATIENT PHYSICAL THERAPY FEMALE PELVIC EVALUATION   Patient Name: Nicole Morgan MRN: [id number] DOB:11/21/1995, 28 y.o., female Today's Date: 10/20/2023  END OF SESSION:   Past Medical History:  Diagnosis Date   Anxiety 2020   Bipolar 1 disorder (HCC) 2020   Cystic fibrosis carrier    Depression    no meds   Eczema    HIV (human immunodeficiency virus infection) (HCC) 04/2022   Incompetent cervix    Insomnia 2020   PTSD (post-traumatic stress disorder) 2020   Substance abuse (HCC)    alcohol, cocaine, marijuana   UTI (urinary tract infection)    Past Surgical History:  Procedure Laterality Date   CERVICAL CERCLAGE  06/06/2012   Procedure: CERCLAGE CERVICAL;  Surgeon: Bing Plume, MD;  Location: [facility];  Service: Gynecology;  Laterality: N/A;   LAPAROSCOPIC BILATERAL SALPINGECTOMY Bilateral 10/08/2022   Procedure: LAPAROSCOPIC BILATERAL SALPINGECTOMY;  Surgeon: Warden Fillers, MD;  Location: [facility] OR;  Service: Gynecology;  Laterality: Bilateral;   PLACEMENT OF BREAST IMPLANTS     WISDOM TOOTH EXTRACTION     Patient Active Problem List   Diagnosis Date Noted   Dysmenorrhea 08/26/2023   Borderline personality disorder (HCC) 05/28/2023   Cocaine use disorder, moderate, in sustained remission (HCC) 05/28/2023   Tobacco use disorder 05/28/2023   Cannabis use disorder 05/28/2023   Weight loss, unintentional 05/20/2023   Rectal bleeding 05/20/2023   GAD (generalized anxiety disorder) 11/08/2022   PTSD (post-traumatic stress disorder) 11/08/2022   HIV (human immunodeficiency virus infection) (HCC) 05/01/2022   Cystic fibrosis carrier 04/09/2022   History of substance use disorder 04/08/2021    PCP: Janett Billow, PA  REFERRING PROVIDER: Sue Lush, FNP   REFERRING DIAG:  N94.10 (ICD-10-CM) - Dyspareunia in female  R10.2 (ICD-10-CM) - Pelvic pain    THERAPY DIAG:  No diagnosis found.  Rationale for Evaluation and Treatment: Rehabilitation  ONSET  DATE: ***  SUBJECTIVE:                                                                                                                                                                                           SUBJECTIVE STATEMENT: *** Fluid intake: {Yes/No:304960894}   PAIN:  Are you having pain? {yes/no:20286} NPRS scale: ***/10 Pain location: {pelvic pain location:27098}  Pain type: {type:313116} Pain description: {PAIN DESCRIPTION:21022940}   Aggravating factors: *** Relieving factors: ***  PRECAUTIONS: {Therapy precautions:24002}  RED FLAGS: {PT Red Flags:29287}   WEIGHT BEARING RESTRICTIONS: {Yes ***/No:24003}  FALLS:  Has patient fallen in last 6 months? {fallsyesno:27318}  LIVING ENVIRONMENT: Lives with: {[facility] lives with:25569::"lives with their family"} Lives in: {  Lives in:25570} Stairs: {opstairs:27293} Has following equipment at home: {Assistive devices:23999}  OCCUPATION: ***  PLOF: {PLOF:24004}  PATIENT GOALS: ***  PERTINENT HISTORY:  HIV; PTSD; Eczema; Bipolar; Laparoscopic bilateral salpingectomy Sexual abuse: {Yes/No:304960894}  BOWEL MOVEMENT: Pain with bowel movement: {yes/no:20286} Type of bowel movement:{PT BM type:27100} Fully empty rectum: {Yes/No:304960894} Leakage: {Yes/No:304960894} Pads: {Yes/No:304960894} Fiber supplement: {Yes/No:304960894}  URINATION: Pain with urination: {yes/no:20286} Fully empty bladder: {Yes/No:304960894} Stream: {PT urination:27102} Urgency: {Yes/No:304960894} Frequency: *** Leakage: {PT leakage:27103} Pads: {Yes/No:304960894}  INTERCOURSE: Pain with intercourse: During Penetration and deep Ability to have vaginal penetration:  {Yes/No:304960894} Climax: *** Marinoff Scale: ***/3  PREGNANCY: Vaginal deliveries *** Tearing {Yes***/No:304960894} C-section deliveries *** Currently pregnant {Yes***/No:304960894}  PROLAPSE: {PT prolapse:27101}   OBJECTIVE:  Note: Objective measures were  completed at Evaluation unless otherwise noted.  DIAGNOSTIC FINDINGS:  ***  PATIENT SURVEYS:  {rehab surveys:24030}  PFIQ-7 ***  COGNITION: Overall cognitive status: {cognition:24006}     SENSATION: Light touch: {intact/deficits:24005} Proprioception: {intact/deficits:24005}  MUSCLE LENGTH: Hamstrings: Right *** deg; Left *** deg Thomas test: Right *** deg; Left *** deg  LUMBAR SPECIAL TESTS:  {lumbar special test:25242}  FUNCTIONAL TESTS:  {Functional tests:24029}  GAIT: Distance walked: *** Assistive device utilized: {Assistive devices:23999} Level of assistance: {Levels of assistance:24026} Comments: ***  POSTURE: {posture:25561}  PELVIC ALIGNMENT:  LUMBARAROM/PROM:  A/PROM A/PROM  eval  Flexion   Extension   Right lateral flexion   Left lateral flexion   Right rotation   Left rotation    (Blank rows = not tested)  LOWER EXTREMITY ROM:  {AROM/PROM:27142} ROM Right eval Left eval  Hip flexion    Hip extension    Hip abduction    Hip adduction    Hip internal rotation    Hip external rotation    Knee flexion    Knee extension    Ankle dorsiflexion    Ankle plantarflexion    Ankle inversion    Ankle eversion     (Blank rows = not tested)  LOWER EXTREMITY MMT:  MMT Right eval Left eval  Hip flexion    Hip extension    Hip abduction    Hip adduction    Hip internal rotation    Hip external rotation    Knee flexion    Knee extension    Ankle dorsiflexion    Ankle plantarflexion    Ankle inversion    Ankle eversion     PALPATION:   General  ***                External Perineal Exam ***                             Internal Pelvic Floor ***  Patient confirms identification and approves PT to assess internal pelvic floor and treatment {yes/no:20286}  PELVIC MMT:   MMT eval  Vaginal   Internal Anal Sphincter   External Anal Sphincter   Puborectalis   Diastasis Recti   (Blank rows = not tested)         TONE: ***  PROLAPSE: ***  TODAY'S TREATMENT:  DATE: ***  EVAL ***   PATIENT EDUCATION:  Education details: *** Person educated: {Person educated:25204} Education method: {Education Method:25205} Education comprehension: {Education Comprehension:25206}  HOME EXERCISE PROGRAM: ***  ASSESSMENT:  CLINICAL IMPRESSION: Patient is a *** y.o. *** who was seen today for physical therapy evaluation and treatment for ***.   OBJECTIVE IMPAIRMENTS: {opptimpairments:25111}.   ACTIVITY LIMITATIONS: {activitylimitations:27494}  PARTICIPATION LIMITATIONS: {participationrestrictions:25113}  PERSONAL FACTORS: {Personal factors:25162} are also affecting patient's functional outcome.   REHAB POTENTIAL: {rehabpotential:25112}  CLINICAL DECISION MAKING: {clinical decision making:25114}  EVALUATION COMPLEXITY: {Evaluation complexity:25115}   GOALS: Goals reviewed with patient? {yes/no:20286}  SHORT TERM GOALS: Target date: ***  *** Baseline: Goal status: INITIAL  2.  *** Baseline:  Goal status: INITIAL  3.  *** Baseline:  Goal status: INITIAL  4.  *** Baseline:  Goal status: INITIAL  5.  *** Baseline:  Goal status: INITIAL  6.  *** Baseline:  Goal status: INITIAL  LONG TERM GOALS: Target date: ***  *** Baseline:  Goal status: INITIAL  2.  *** Baseline:  Goal status: INITIAL  3.  *** Baseline:  Goal status: INITIAL  4.  *** Baseline:  Goal status: INITIAL  5.  *** Baseline:  Goal status: INITIAL  6.  *** Baseline:  Goal status: INITIAL  PLAN:  PT FREQUENCY: {rehab frequency:25116}  PT DURATION: {rehab duration:25117}  PLANNED INTERVENTIONS: {rehab planned interventions:25118::"97110-Therapeutic exercises","97530- Therapeutic [phone]- Neuromuscular re-education","97535- Self NGEX","52841- Manual  therapy"}  PLAN FOR NEXT SESSION: ***   Lama Narayanan, PT 10/20/2023, 8:34 AM

## 2023-10-21 ENCOUNTER — Encounter: Payer: Medicaid Other | Admitting: Physical Therapy

## 2023-10-21 ENCOUNTER — Telehealth: Payer: Self-pay | Admitting: Physical Therapy

## 2023-10-21 NOTE — Telephone Encounter (Signed)
Called patient about her missed eval today at 10:30. Unable to leave a message due to mailbox is full.  Eulis Foster, PT @1 /28/25@ 10:48 AM

## 2023-10-28 ENCOUNTER — Encounter: Payer: Medicaid Other | Admitting: Physical Therapy

## 2023-10-30 ENCOUNTER — Encounter: Payer: Self-pay | Admitting: Family Medicine

## 2023-10-30 ENCOUNTER — Other Ambulatory Visit: Payer: Self-pay

## 2023-10-30 ENCOUNTER — Other Ambulatory Visit (HOSPITAL_COMMUNITY): Payer: Self-pay

## 2023-11-04 ENCOUNTER — Other Ambulatory Visit (HOSPITAL_COMMUNITY): Payer: Self-pay

## 2023-11-04 ENCOUNTER — Encounter: Payer: Medicaid Other | Admitting: Physical Therapy

## 2023-11-11 ENCOUNTER — Encounter: Payer: Medicaid Other | Admitting: Physical Therapy

## 2023-11-17 ENCOUNTER — Other Ambulatory Visit (HOSPITAL_COMMUNITY): Payer: Self-pay

## 2023-11-17 MED ORDER — CYCLOBENZAPRINE HCL 10 MG PO TABS
10.0000 mg | ORAL_TABLET | Freq: Three times a day (TID) | ORAL | 0 refills | Status: DC
Start: 2023-11-17 — End: 2024-01-21
  Filled 2023-11-17: qty 15, 5d supply, fill #0

## 2023-11-17 MED ORDER — NAPROXEN 500 MG PO TABS
500.0000 mg | ORAL_TABLET | Freq: Two times a day (BID) | ORAL | 0 refills | Status: AC
  Filled 2023-11-17: qty 25, 13d supply, fill #0

## 2023-11-18 ENCOUNTER — Other Ambulatory Visit: Payer: Self-pay | Admitting: Pharmacy Technician

## 2023-11-18 ENCOUNTER — Other Ambulatory Visit: Payer: Self-pay

## 2023-11-18 ENCOUNTER — Other Ambulatory Visit (HOSPITAL_COMMUNITY): Payer: Self-pay

## 2023-11-18 NOTE — Progress Notes (Signed)
 Specialty Pharmacy Refill Coordination Note  Nicole Morgan is a 28 y.o. female contacted today regarding refills of specialty medication(s) Dolutegravir-lamiVUDine (Dovato)   Patient requested Delivery   Delivery date: 11/27/23   Verified address: [address]  [place]   Medication will be filled on 11/26/23.

## 2023-11-20 ENCOUNTER — Ambulatory Visit: Payer: Medicaid Other | Admitting: Dermatology

## 2023-11-21 ENCOUNTER — Other Ambulatory Visit (HOSPITAL_COMMUNITY): Payer: Self-pay

## 2023-11-21 ENCOUNTER — Other Ambulatory Visit: Payer: Self-pay

## 2023-11-22 NOTE — Progress Notes (Deleted)
 BH MD Outpatient Progress Note  11/22/2023 12:24 PM Nicole Morgan  MRN:  [id number]  Assessment:  Nicole Morgan presents for follow-up evaluation. Today, 11/22/23, patient reports improvement in her depression and anxiety symptoms. She reports decreased feelings of being overwhelmed, decreased feelings of guilt, improved sleep, decreased irritability (PHQ-9: 3) after starting the lexapro and increasing her remeron. Socially, patient has also started a new job at [employer] in early December and she reports she feels supported by her workplace. She has a new stressor of recurrence of cancer in her grandma and she feels sad and anxious about this but it does not interfere with her social or occupational domains. She denies any SI/HI/AVH. She denies any NSSIB. She continues to use cannabis and vape. We discussed the impact this could have on her mental health and she is in the pre-contemplative stage regarding cessation of these substances. She reports unable to  follow-up with therapist appointment due to work and next scheduled therapy appointment would not be until April. Walk-in hours were discussed with patient regarding therapy appointments and also placed in patient instructions. We continued to discuss decreasing her caffeine intake.  Identifying Information: Nicole Morgan is a 28 y.o. female with a history of bipolar I who is an established patient with [facility] for management of anxiety.   Plan: # Major Depressive Disorder, single episode, in partial remission # Borderline personality disorder Past medication trials: seroquel, abilify Status of problem: ongoing Interventions: -- Continue lexapro 10mg  daily for depressive symptoms  -- Continue remeron 30mg  at bedtime -- Next therapy appointment not until April, front desk discussed walk-in hours with patient -- Sent patient pdf of DBT handbook -- Continue to discourage self-harm as a coping mechanism -- Discussed  with patient continuing to cut down on caffeine including energy drinks   # Generalized anxiety disorder Past medication trials: propranolol Status of problem: ongoing Interventions: -- Continue lexapro 10mg  daily for depression and anxiety  -- Continue remeron 30mg  at bedtime -- Continue propranolol from PCP PRN for social anxiety   # PTSD Past medication trials: none Status of problem: ongoing Interventions: -- Referred patient to therapy    # Cocaine use disorder, in sustained remission Past medication trials: none Status of problem: ongoing Interventions: -- Continue to encourage cessation   # Tobacco use disorder Past medication trials: nicotine patch Status of problem: ongoing Interventions: -- Continue to encourage cessation   # Cannabis use disorder Past medication trials: none Status of problem: ongoing Interventions: -- Continue to encourage cessation  Patient was given contact information for behavioral health clinic and was instructed to call 911 for emergencies.   Return to clinic 11/26/23.   Subjective:  Chief Complaint: "I'm feeling better"  Interval History:  -missed PT appointment and eval ***  Patient is seen in the office. She is alert, oriented. She has a euthymic affect. Patient reports she has noticed difference in her mood with starting lexapro, noticed being able to process better. Feel like things are not as big of a deal. She states that her son even notices a difference in her and will ask her if she is taking her medications. Feel less stressed, feel like able to do more problem-solving and handle stuff better. She reports she is making lists. Feels more "normal." She reports she is ruminating less. She reports when she started taking it initially, she felt a little hazy and more forgetful initially. Didn't take it for 2 days due to not  having refill and she reports she got a little shaky and sweating, had more anxiety symptoms and crying  spells. She has since restarted it and is currently feeling a little weird with restarting it, feeling a little spacy, feeling something slipped her mind but she states that this happened when she restarted lexapro last time and it improved in a couple of days. She did notice a month after using the lexapro that she was a little more easily irritable but does not currently feel that way.   Given her past historical diagnosis of bipolar, I asked patient about signs and symptoms associated with mania. Patient denies any increased impulsivity or decreased sleep. She reports decreased impulsivity, reports she is not blowing up as much as before and she is trying to distance herself from negativity. She reports most days she is going to sleep between 9-11pm and waking up at 6am. She feels like she has goals and is going in the right direction. She denies increased racing thoughts or increased energy. She overall feels like she is just able to see more positive in the world.   She reports feeling like she has increased appetite and is now eating breakfast which she did not do before. She report she is still drinking red bulls in the morning and throughout the day but she has cut down on her coffee intake. She reports she previously would have a pot of coffee vs now she will only have 2 cups in the morning.   Reports wants virtual therapy appointment. Reports it might have been work that caused her to miss the previous appointment.   Reports she is still smoking weed. Still smoking 1g/day. Reports she is still vaping, 1 cartridge once every 2 days. Reports she has very vivid dreams.   She denies SI/HI/AVH.   PHQ-9: 3 GAD-7: 4  Visit Diagnosis:  No diagnosis found.  Past Psychiatric History:  Diagnoses: anxiety, depression, PTSD, bipolar I Medication trials: propranolol 10 PRN for anxiety, abilify 15, elavil 10 at bedtime, gabapentin 600 TID, seroquel 250 for insomnia   Previous psychiatrist/therapist:  went to [facility] before but then would use again Hospitalizations: at 28 y/o (reports didn't want to be at home and wanted to run away) and 4 years ago for self-harm (scratched self) Suicide attempts: denies SIB: cut or burn self, ripped hair out, scratched self to the point left bruises. Reports last time was beginning of July.   Hx of violence towards others: none Current access to guns: denies  Hx of trauma/abuse: Reports hard time being around guns due to past trauma.   Past Medical History:  Past Medical History:  Diagnosis Date   Anxiety 2020   Bipolar 1 disorder (HCC) 2020   Cystic fibrosis carrier    Depression    no meds   Eczema    HIV (human immunodeficiency virus infection) (HCC) 04/2022   Incompetent cervix    Insomnia 2020   PTSD (post-traumatic stress disorder) 2020   Substance abuse (HCC)    alcohol, cocaine, marijuana   UTI (urinary tract infection)     Past Surgical History:  Procedure Laterality Date   CERVICAL CERCLAGE  06/06/2012   Procedure: CERCLAGE CERVICAL;  Surgeon: Bing Plume, MD;  Location: [facility];  Service: Gynecology;  Laterality: N/A;   LAPAROSCOPIC BILATERAL SALPINGECTOMY Bilateral 10/08/2022   Procedure: LAPAROSCOPIC BILATERAL SALPINGECTOMY;  Surgeon: Warden Fillers, MD;  Location: [facility] OR;  Service: Gynecology;  Laterality: Bilateral;   PLACEMENT OF BREAST IMPLANTS  WISDOM TOOTH EXTRACTION     Family Psychiatric History:  Alcohol abuse in her maternal grandmother; Bipolar disorder in her mother;    Family History:  Family History  Problem Relation Age of Onset   Bipolar disorder Mother        schizophrenic   Mental illness Mother        schizophrenia   Deep vein thrombosis Maternal Aunt    Alcohol abuse Maternal Grandmother    Mental illness Maternal Grandmother    Bipolar disorder Maternal Grandmother        schizophrenic    Cancer Maternal Grandmother        lymph nodes   Migraines Neg Hx    Headache Neg Hx     Social  History:  Academic/Vocational: Works as a Lawyer.  -Has daughter (10 months) and son (10-11) -Daughter's dad not around, "he did me wrong" -Has good relationship with son's father  -Currently her father is helping out with daughter  Social History   Socioeconomic History   Marital status: Single    Spouse name: Not on file   Number of children: 1   Years of education: Not on file   Highest education level: Some college, no degree  Occupational History    Employer: [employer]  Tobacco Use   Smoking status: Every Day    Types: E-cigarettes    Last attempt to quit: 04/2022    Years since quitting: 1.5    Passive exposure: Current   Smokeless tobacco: Never  Vaping Use   Vaping status: Every Day   Substances: Nicotine  Substance and Sexual Activity   Alcohol use: Not Currently   Drug use: Not Currently    Frequency: 1.0 times per week    Types: Marijuana    Comment: last day 09/26/22-   Sexual activity: Not Currently  Other Topics Concern   Not on file  Social History Narrative   Lives with child    Right handed   Caffeine: about 3 cups/day maybe more   Social Drivers of Corporate investment banker Strain: Medium Risk (05/16/2022)   Overall Financial Resource Strain (CARDIA)    Difficulty of Paying Living Expenses: Somewhat hard  Food Insecurity: No Food Insecurity (09/30/2022)   Hunger Vital Sign    Worried About Running Out of Food in the Last Year: Never true    Ran Out of Food in the Last Year: Never true  Recent Concern: Food Insecurity - Food Insecurity Present (07/24/2022)   Hunger Vital Sign    Worried About Running Out of Food in the Last Year: Sometimes true    Ran Out of Food in the Last Year: Never true  Transportation Needs: No Transportation Needs (09/30/2022)   PRAPARE - Administrator, Civil Service (Medical): No    Lack of Transportation (Non-Medical): No  Physical Activity: Sufficiently Active (05/16/2022)   Exercise Vital Sign    Days  of Exercise per Week: 7 days    Minutes of Exercise per Session: 30 min  Stress: Stress Concern Present (05/16/2022)   [employer] of Occupational Health - Occupational Stress Questionnaire    Feeling of Stress : Very much  Social Connections: Unknown (01/17/2023)   [facility]   Social Network    Social Network: Not on file    Allergies: No Known Allergies  Current Medications: Current Outpatient Medications  Medication Sig Dispense Refill   cyclobenzaprine (FLEXERIL) 10 MG tablet Take one  tablet (10 mg dose) by mouth 3 (three) times a day as needed for Muscle spasms. 15 tablet 0   dolutegravir-lamiVUDine (DOVATO) 50-300 MG tablet Take 1 tablet by mouth daily. 30 tablet 11   escitalopram (LEXAPRO) 10 MG tablet Take 1 tablet (10 mg total) by mouth daily. 30 tablet 1   mirtazapine (REMERON) 30 MG tablet Take 1 tablet (30 mg total) by mouth at bedtime. 30 tablet 1   naproxen (NAPROSYN) 500 MG tablet Take one tablet (500 mg dose) by mouth 2 (two) times daily with meals. 25 tablet 0   propranolol (INDERAL) 10 MG tablet Take 1 tablet (10 mg total) by mouth as needed (Take before situations that cause anxiety.). 30 tablet 1   No current facility-administered medications for this visit.    ROS: Review of Systems  HENT: Negative.    Eyes: Negative.   Respiratory: Negative.    Cardiovascular: Negative.   Endocrine: Negative.   Genitourinary: Negative.   Musculoskeletal: Negative.   Skin: Negative.   Allergic/Immunologic: Negative.   Neurological: Negative.   Hematological: Negative.   Psychiatric/Behavioral: Euthymic mood. Reports improvement in anxiety and depression.  Objective:  Psychiatric Specialty Exam: not currently breastfeeding.There is no height or weight on file to calculate BMI.  General Appearance: Fairly Groomed  Eye Contact:  Good  Speech:  Clear and Coherent  Volume:  Normal  Mood:  Euthymic  Affect:  Appropriate  Thought  Content: Logical   Suicidal Thoughts:  No  Homicidal Thoughts:  No  Thought Process:  Goal Directed  Orientation:  Full (Time, Place, and Person)    Memory: Grossly intact   Judgment:  Good  Insight:  Good  Concentration:  Concentration: Good  Recall: not formally assessed   Fund of Knowledge: Good  Language: Good  Psychomotor Activity:  Normal  Akathisia:  No  AIMS (if indicated): not done  Assets:  Communication Skills Desire for Improvement Housing Intimacy Social Support  ADL's:  Intact  Cognition: WNL  Sleep:  Fair   PE: General: well-appearing; no acute distress  Pulm: no increased work of breathing on room air  Strength & Muscle Tone: within normal limits Neuro: no focal neurological deficits observed  Gait & Station: normal  Metabolic Disorder Labs: Lab Results  Component Value Date   HGBA1C 5.3 05/22/2023   MPG 108 05/19/2023   No results found for: "PROLACTIN" Lab Results  Component Value Date   CHOL 148 05/22/2023   TRIG 80 05/22/2023   HDL 62 05/22/2023   CHOLHDL 2.4 05/22/2023   LDLCALC 71 05/22/2023   LDLCALC 59 05/19/2023   Lab Results  Component Value Date   TSH 0.475 05/22/2023   TSH 0.372 (L) 11/10/2019    Therapeutic Level Labs: No results found for: "LITHIUM" No results found for: "VALPROATE" No results found for: "CBMZ"  Screenings:  GAD-7    Flowsheet Row Office Visit from 07/17/2023 in [facility] Office Visit from 05/27/2023 in [facility] Office Visit from 11/08/2022 in [facility] Visit from 10/30/2022 in [facility] Office Visit from 09/30/2022 in [facility]  Total GAD-7 Score 9 15 0 0 3      PHQ2-9    Flowsheet Row Office Visit from 10/09/2023 in [facility] [place].  [facility] Clinical Support from 08/13/2023 in [facility] Office Visit from 07/17/2023 in [facility] Office Visit from 05/28/2023 in [facility] Office Visit from 05/27/2023 in [facility] at [facility]  PHQ-2 Total Score 0 5 0 2 2  PHQ-9 Total Score -- 16 5 9 6       Flowsheet Row ED from 12/24/2022 in [facility] (Discharged) from 10/08/2022 in [facility] ED from 08/18/2022 in [facility] at [facility] RISK CATEGORY No Risk No Risk No Risk       Collaboration of Care: Collaboration of Care: Medication Management AEB Dr. Josephina Shih  Patient/Guardian was advised Release of Information must be obtained prior to any record release in order to collaborate their care with an outside provider. Patient/Guardian was advised if they have not already done so to contact the registration department to sign all necessary forms in order for [place] to release information regarding their care.   Consent: Patient/Guardian gives verbal consent for treatment and assignment of benefits for services provided during this visit. Patient/Guardian expressed understanding and agreed to proceed.   A total of 60 minutes was spent involved in face to face clinical care, chart review, documentation.  Karie Fetch, MD, PGY-2 11/22/2023, 12:24 PM

## 2023-11-26 ENCOUNTER — Other Ambulatory Visit: Payer: Self-pay

## 2023-11-26 ENCOUNTER — Encounter (HOSPITAL_COMMUNITY): Payer: Medicaid Other | Admitting: Psychiatry

## 2023-12-04 ENCOUNTER — Encounter (INDEPENDENT_AMBULATORY_CARE_PROVIDER_SITE_OTHER): Payer: Self-pay

## 2023-12-04 DIAGNOSIS — Z79899 Other long term (current) drug therapy: Secondary | ICD-10-CM

## 2023-12-05 ENCOUNTER — Other Ambulatory Visit (HOSPITAL_COMMUNITY): Payer: Self-pay

## 2023-12-05 ENCOUNTER — Other Ambulatory Visit: Payer: Self-pay

## 2023-12-05 MED ORDER — GABAPENTIN 300 MG PO CAPS
300.0000 mg | ORAL_CAPSULE | Freq: Two times a day (BID) | ORAL | 1 refills | Status: DC
  Filled 2023-12-05: qty 60, 30d supply, fill #0

## 2023-12-15 ENCOUNTER — Other Ambulatory Visit (HOSPITAL_COMMUNITY): Payer: Self-pay

## 2023-12-15 ENCOUNTER — Encounter: Payer: Self-pay | Admitting: Family Medicine

## 2023-12-15 MED ORDER — AMOXICILLIN-POT CLAVULANATE 875-125 MG PO TABS
1.0000 | ORAL_TABLET | Freq: Two times a day (BID) | ORAL | 0 refills | Status: DC
Start: 2023-12-15 — End: 2024-01-21
  Filled 2023-12-15: qty 14, 7d supply, fill #0

## 2023-12-16 ENCOUNTER — Telehealth: Admitting: Physician Assistant

## 2023-12-16 ENCOUNTER — Other Ambulatory Visit: Payer: Self-pay

## 2023-12-16 ENCOUNTER — Other Ambulatory Visit (HOSPITAL_COMMUNITY): Payer: Self-pay

## 2023-12-16 DIAGNOSIS — G8929 Other chronic pain: Secondary | ICD-10-CM

## 2023-12-16 DIAGNOSIS — M545 Low back pain, unspecified: Secondary | ICD-10-CM

## 2023-12-16 DIAGNOSIS — R195 Other fecal abnormalities: Secondary | ICD-10-CM | POA: Diagnosis not present

## 2023-12-16 DIAGNOSIS — Z760 Encounter for issue of repeat prescription: Secondary | ICD-10-CM

## 2023-12-16 MED ORDER — ESCITALOPRAM OXALATE 10 MG PO TABS
10.0000 mg | ORAL_TABLET | Freq: Every day | ORAL | 1 refills | Status: DC
  Filled 2023-12-16 (×2): qty 30, 30d supply, fill #0
  Filled 2024-01-01 – 2024-01-16 (×2): qty 30, 30d supply, fill #1

## 2023-12-16 NOTE — Progress Notes (Signed)
 Virtual Visit Consent   Nicole Morgan, you are scheduled for a virtual visit with a [facility] provider today. Just as with appointments in the office, your consent must be obtained to participate. Your consent will be active for this visit and any virtual visit you may have with one of our providers in the next 365 days. If you have a MyChart account, a copy of this consent can be sent to you electronically.  As this is a virtual visit, video technology does not allow for your provider to perform a traditional examination. This may limit your provider's ability to fully assess your condition. If your provider identifies any concerns that need to be evaluated in person or the need to arrange testing (such as labs, EKG, etc.), we will make arrangements to do so. Although advances in technology are sophisticated, we cannot ensure that it will always work on either your end or our end. If the connection with a video visit is poor, the visit may have to be switched to a telephone visit. With either a video or telephone visit, we are not always able to ensure that we have a secure connection.  By engaging in this virtual visit, you consent to the provision of healthcare and authorize for your insurance to be billed (if applicable) for the services provided during this visit. Depending on your insurance coverage, you may receive a charge related to this service.  I need to obtain your verbal consent now. Are you willing to proceed with your visit today? Nicole Morgan has provided verbal consent on 12/16/2023 for a virtual visit (video or telephone). [name], [place]  Date: 12/16/2023 8:07 AM   Virtual Visit via Video Note   I, [name], connected with  [address] [place]  ([id number], [birth year]) on 12/16/23 at  7:45 AM EDT by a video-enabled telemedicine application and verified that I am speaking with the correct person using two identifiers.  Location: Patient: Virtual Visit Location  Patient: Home Provider: Virtual Visit Location Provider: Home Office   I discussed the limitations of evaluation and management by telemedicine and the availability of in person appointments. The patient expressed understanding and agreed to proceed.    History of Present Illness: Nicole Morgan is a 28 y.o. who identifies as a female who was assigned female at birth, and is being seen today for multiple complaints.   Notes she has been out of her Lexapro 10 mg for a couple of weeks. Has called her specialist office multiple times but so far has not been able to get medication refilled. She has noted mood fluctuations off of her medication and she is worried about this as it makes her more apathetic and not consistent with her HIV medications, which she knows she needs to be.  Patient also requesting to see a specialist for her chronic low back pain. Notes breakthrough symptoms despite her Gabapentin. Notes she messaged her PCP and ID specialist yesterday but no response. Marland Kitchen  Also noting ongoing stool changes with occasional blood in stool. Denies rectal pain. Her ID specialist sent her to GI and she was supposed to have a colonoscopy for this but was unable to attend. Has not called back to reschedule.    HPI: HPI  Problems:  Patient Active Problem List   Diagnosis Date Noted   Dysmenorrhea 08/26/2023   Borderline personality disorder (HCC) 05/28/2023   Cocaine use disorder, moderate, in sustained remission (HCC) 05/28/2023   Tobacco use disorder  05/28/2023   Cannabis use disorder 05/28/2023   Weight loss, unintentional 05/20/2023   Rectal bleeding 05/20/2023   GAD (generalized anxiety disorder) 11/08/2022   PTSD (post-traumatic stress disorder) 11/08/2022   HIV (human immunodeficiency virus infection) (HCC) 05/01/2022   Cystic fibrosis carrier 04/09/2022   History of substance use disorder 04/08/2021    Allergies: No Known Allergies Medications:  Current Outpatient Medications:     amoxicillin-clavulanate (AUGMENTIN) 875-125 MG tablet, Take 1 tablet by mouth 2 (two) times daily for 7 days, Disp: 14 tablet, Rfl: 0   cyclobenzaprine (FLEXERIL) 10 MG tablet, Take one tablet (10 mg dose) by mouth 3 (three) times a day as needed for Muscle spasms., Disp: 15 tablet, Rfl: 0   dolutegravir-lamiVUDine (DOVATO) 50-300 MG tablet, Take 1 tablet by mouth daily., Disp: 30 tablet, Rfl: 11   escitalopram (LEXAPRO) 10 MG tablet, Take 1 tablet (10 mg total) by mouth daily. One-time refill placed. Further refills to come from regular providers., Disp: 30 tablet, Rfl: 1   gabapentin (NEURONTIN) 300 MG capsule, Take 1 capsule (300 mg total) by mouth 2 (two) times daily., Disp: 60 capsule, Rfl: 1   mirtazapine (REMERON) 30 MG tablet, Take 1 tablet (30 mg total) by mouth at bedtime., Disp: 30 tablet, Rfl: 1   naproxen (NAPROSYN) 500 MG tablet, Take one tablet (500 mg dose) by mouth 2 (two) times daily with meals., Disp: 25 tablet, Rfl: 0   propranolol (INDERAL) 10 MG tablet, Take 1 tablet (10 mg total) by mouth as needed (Take before situations that cause anxiety.)., Disp: 30 tablet, Rfl: 1  Observations/Objective: Patient is well-developed, well-nourished in no acute distress.  Resting comfortably  at home.  Head is normocephalic, atraumatic.  No labored breathing. Speech is clear and coherent with logical content.  Patient is alert and oriented at baseline.   Assessment and Plan: 1. Encounter for medication refill - escitalopram (LEXAPRO) 10 MG tablet; Take 1 tablet (10 mg total) by mouth daily. One-time refill placed. Further refills to come from regular providers.  Dispense: 30 tablet; Refill: 1  One-time refill sent. BH resources given as she is having trouble with appointments with her current provider.   2. Chronic bilateral low back pain, unspecified whether sciatica present (Primary)  Ongoing and breakthrough symptoms despite her Gabapentin. Has call out to her PCP for referral but  no response yet. Gave her resources for same day sports med/ortho walk-in clinics so she can be evaluated today as she is off and rarely has a day off to be seen.  3. Change in stool  Ongoing. Was scheduled for colonoscopy but missed this. She is to call [facility] to reschedule her evaluation ASAP.   Follow Up Instructions: I discussed the assessment and treatment plan with the patient. The patient was provided an opportunity to ask questions and all were answered. The patient agreed with the plan and demonstrated an understanding of the instructions.  A copy of instructions were sent to the patient via MyChart unless otherwise noted below.   The patient was advised to call back or seek an in-person evaluation if the symptoms worsen or if the condition fails to improve as anticipated.    [name], PA-C

## 2023-12-16 NOTE — Patient Instructions (Addendum)
 Nicole Morgan, thank you for joining [name], PA-C for today's virtual visit.  While this provider is not your primary care provider (PCP), if your PCP is located in our provider database this encounter information will be shared with them immediately following your visit.   A [facility] MyChart account gives you access to today's visit and all your visits, tests, and labs performed at [facility] " click here if you don't have a [facility] MyChart account or go to [email or web address]  Consent: (Patient) Nicole Morgan provided verbal consent for this virtual visit at the beginning of the encounter.  Current Medications:  Current Outpatient Medications:    amoxicillin-clavulanate (AUGMENTIN) 875-125 MG tablet, Take one tablet by mouth 2 (two) times daily for 7 days., Disp: 14 tablet, Rfl: 0   cyclobenzaprine (FLEXERIL) 10 MG tablet, Take one tablet (10 mg dose) by mouth 3 (three) times a day as needed for Muscle spasms., Disp: 15 tablet, Rfl: 0   dolutegravir-lamiVUDine (DOVATO) 50-300 MG tablet, Take 1 tablet by mouth daily., Disp: 30 tablet, Rfl: 11   escitalopram (LEXAPRO) 10 MG tablet, Take 1 tablet (10 mg total) by mouth daily., Disp: 30 tablet, Rfl: 1   gabapentin (NEURONTIN) 300 MG capsule, Take 1 capsule (300 mg total) by mouth 2 (two) times daily., Disp: 60 capsule, Rfl: 1   mirtazapine (REMERON) 30 MG tablet, Take 1 tablet (30 mg total) by mouth at bedtime., Disp: 30 tablet, Rfl: 1   naproxen (NAPROSYN) 500 MG tablet, Take one tablet (500 mg dose) by mouth 2 (two) times daily with meals., Disp: 25 tablet, Rfl: 0   propranolol (INDERAL) 10 MG tablet, Take 1 tablet (10 mg total) by mouth as needed (Take before situations that cause anxiety.)., Disp: 30 tablet, Rfl: 1   Medications ordered in this encounter:  No orders of the defined types were placed in this encounter.    *If you need refills on other medications prior to your next appointment, please  contact your pharmacy*  Follow-Up: Call back or seek an in-person evaluation if the symptoms worsen or if the condition fails to improve as anticipated.  [facility] [phone]  Other Instructions I have sent in a refill of your Lexapro to your pharmacy.  If you are still having issue getting follow-up with your behavioral health provider, I have listed some resources below.  Please also call [facility] as instructed to reschedule for evaluation and colonoscopy. Do this ASAP.  I have also listed our sports medicine walk-in resources for you so you can get evaluation today for your ongoing back pain.  [facility]   Monday - Friday   11AM-7PM   Schedule via website (see below) or as walk-in   [address]   [place], [place] [address]   [phone]   [facility]  [place], [place]  [place]      [facility]   [address],  [place], [place] [address]   GET DIRECTIONS   CONTACT   Phone: [phone]   Phone: [phone]   URGENT CARE HOURS   Monday - Friday:   8:00am to 8:00pm   Saturday:   10:00am to 3:00pm         [facility]   [address]  [place], [place] [address]   GET DIRECTIONS   CONTACT   Phone: [phone]   URGENT CARE HOURS   Monday:  9:00AM - 9:00PM   Tuesday:   9:00AM - 9:00PM   Wednesday:   9:00AM - 9:00PM   Thursday:   9:00AM - 9:00PM   Friday:   9:00AM - 9:00PM   Saturday:   9:00AM - 9:00PM   Sunday:   9:00AM - 9:00PM   HOLIDAY HOURS   Holidays:   8:30AM - 4:30PM      [facility]   After Hours [facility] [phone]      [facility] [address] [place], [place] [address]      EVENINGS & WEEKENDS NO APPOINTMENT NECESSARY Mon-Fri 5:30PM - 9PM    Sat 9 AM - 2 PM Sun 10 AM - 2 PM       [facility]   [address]  [address]  [place], [place] [address]   [phone]      OFFICE HOURS:   MONDAY - FRIDAY : 8:00 A.M. - 4:00 P.M.      [facility]  [address]  [place], [place] [address]   [phone]      OFFICE HOURS:   MONDAY - FRIDAY: 8:00 A.M. - 8:30 P.M.   SATURDAY: 10:00 A.M. - 2:00 P.M.       Counseling Services:  [facility] [phone]  [facility] - [phone]  [facility] -- [phone]  [facility] -- [phone]  [facility] -- [phone]  [facility] -- [phone]  [facility] - [phone]  [facility] -- [phone]  Marvis Repress, [facility] - [phone]  [facility]  [facility] -- [phone]   Psychiatrists:  [facility]   [phone]  [address]  [place], [place] [address]  [facility]   [phone]  [address]  [place], [place] [address]  [facility]   [phone]  [address]  [place], [place] [address]  [facility]   [phone]  [address]  [place], [place] [address]  [facility]   [phone]  [address]  [place], [place] [address]     If you have been instructed to have an in-person evaluation today at a local Urgent Care facility, please use the link below. It will take you to a list of all of our available [facility], including address, phone number and hours of operation. Please do not delay care.  [facility]  If you or a family member do not have a primary care provider, use the link below to schedule a visit and  establish care. When you choose a [facility] primary care physician or advanced practice provider, you gain a long-term partner in health. Find a Primary Care Provider  Learn more about [facility]'s in-office and virtual care options: [facility] - Get Care Now

## 2023-12-17 ENCOUNTER — Other Ambulatory Visit (HOSPITAL_COMMUNITY): Payer: Self-pay

## 2023-12-17 MED ORDER — GABAPENTIN 300 MG PO CAPS
300.0000 mg | ORAL_CAPSULE | Freq: Three times a day (TID) | ORAL | 1 refills | Status: DC
  Filled 2023-12-17 – 2023-12-23 (×2): qty 90, 30d supply, fill #0
  Filled 2024-01-09 – 2024-01-19 (×4): qty 90, 30d supply, fill #1

## 2023-12-18 ENCOUNTER — Other Ambulatory Visit (HOSPITAL_COMMUNITY): Payer: Self-pay

## 2023-12-19 ENCOUNTER — Other Ambulatory Visit: Payer: Self-pay

## 2023-12-22 ENCOUNTER — Other Ambulatory Visit (HOSPITAL_COMMUNITY): Payer: Self-pay

## 2023-12-23 ENCOUNTER — Other Ambulatory Visit (HOSPITAL_COMMUNITY): Payer: Self-pay

## 2023-12-29 ENCOUNTER — Other Ambulatory Visit: Payer: Self-pay

## 2023-12-29 NOTE — Progress Notes (Signed)
 Specialty Pharmacy Refill Coordination Note  Nicole Morgan is a 28 y.o. female contacted today regarding refills of specialty medication(s) Dolutegravir-lamiVUDine (Dovato)   Patient requested (Patient-Rptd) Delivery   Delivery date: (Patient-Rptd) 01/05/24   Verified address: (Patient-Rptd) [address] [place] [address]   Medication will be filled on 04.11.25.

## 2024-01-01 ENCOUNTER — Other Ambulatory Visit (HOSPITAL_COMMUNITY): Payer: Self-pay

## 2024-01-02 ENCOUNTER — Other Ambulatory Visit (HOSPITAL_COMMUNITY): Payer: Self-pay

## 2024-01-02 ENCOUNTER — Other Ambulatory Visit: Payer: Self-pay

## 2024-01-05 ENCOUNTER — Other Ambulatory Visit (HOSPITAL_COMMUNITY): Payer: Self-pay

## 2024-01-05 ENCOUNTER — Other Ambulatory Visit: Payer: Self-pay

## 2024-01-06 ENCOUNTER — Ambulatory Visit: Payer: Self-pay | Admitting: Licensed Clinical Social Worker

## 2024-01-09 ENCOUNTER — Other Ambulatory Visit (HOSPITAL_COMMUNITY): Payer: Self-pay

## 2024-01-16 ENCOUNTER — Other Ambulatory Visit (HOSPITAL_COMMUNITY): Payer: Self-pay

## 2024-01-19 ENCOUNTER — Other Ambulatory Visit (HOSPITAL_COMMUNITY): Payer: Self-pay

## 2024-01-21 ENCOUNTER — Encounter: Payer: Self-pay | Admitting: Infectious Diseases

## 2024-01-21 ENCOUNTER — Ambulatory Visit (INDEPENDENT_AMBULATORY_CARE_PROVIDER_SITE_OTHER): Admitting: Infectious Diseases

## 2024-01-21 ENCOUNTER — Other Ambulatory Visit (HOSPITAL_COMMUNITY): Payer: Self-pay

## 2024-01-21 ENCOUNTER — Other Ambulatory Visit: Payer: Self-pay

## 2024-01-21 VITALS — BP 126/86 | HR 97 | Temp 98.6°F | Ht 61.5 in | Wt 111.0 lb

## 2024-01-21 DIAGNOSIS — Z21 Asymptomatic human immunodeficiency virus [HIV] infection status: Secondary | ICD-10-CM | POA: Diagnosis present

## 2024-01-21 DIAGNOSIS — R300 Dysuria: Secondary | ICD-10-CM | POA: Diagnosis not present

## 2024-01-21 MED ORDER — CEPHALEXIN 500 MG PO CAPS
500.0000 mg | ORAL_CAPSULE | Freq: Two times a day (BID) | ORAL | 0 refills | Status: AC
  Filled 2024-01-21: qty 10, 5d supply, fill #0

## 2024-01-21 NOTE — Patient Instructions (Addendum)
 Start cephalexin  twice a day for 5 days - will follow up your urine tests if any changes are needed.   Drink plenty of water to flush your bladder out.   Will see you back in 6 months

## 2024-01-21 NOTE — Progress Notes (Signed)
 Name: Nicole Morgan  DOB: [birth year] MRN: [id number] PCP: Noreene Bearded, PA     Brief Narrative:  Nicole Morgan is a 28 y.o. female with HIV diagnosis 04/29/22 from [facility] as part of contact tracing event HIV Risk: sexual History of OIs:  Intake Labs: Hep B sAg (-), sAb (-), cAb (); Hep A (), Hep C (-) Quantiferon (-)   Previous Regimens: [place]  + Descovy  04-2022 Dovato    Genotypes: Not collected.    Subjective   Subjective:   Chief Complaint  Patient presents with   Follow-up    B20      Discussed the use of AI scribe software for clinical note transcription with the patient, who gave verbal consent to proceed.  History of Present Illness   Nicole Morgan is a 28 year old female with HIV here for follow up care. She has new boyfriend with her today who wants to talk more about prevention for him.   She tells me she feels like she has a UTI - experiences dysuria and a noticeable odor, particularly in the morning after holding her urine overnight. There is no vaginal discharge, fever, or abdominal pain. She has a history of urinary tract infections and has previously used Keflex  for treatment.   She is living with HIV and is currently on Dovato , which she takes regularly. Her last CD4 count in January was 1237, indicating good immune function.       Wt Readings from Last 3 Encounters:  01/21/24 111 lb (50.3 kg)  10/09/23 114 lb (51.7 kg)  08/26/23 118 lb (53.5 kg)       01/21/2024    2:52 PM  Depression screen PHQ 2/9  Decreased Interest 0  Down, Depressed, Hopeless 0  PHQ - 2 Score 0  Altered sleeping 0  Tired, decreased energy 0  Change in appetite 0  Feeling bad or failure about yourself  0  Trouble concentrating 0  Moving slowly or fidgety/restless 0  Suicidal thoughts 0  PHQ-9 Score 0     Review of Systems  Constitutional:  Negative for chills, fever, malaise/fatigue and weight loss.  HENT:  Negative for sore throat.   Respiratory:   Negative for cough, sputum production and shortness of breath.   Cardiovascular: Negative.   Gastrointestinal:  Negative for abdominal pain, diarrhea and vomiting.  Musculoskeletal:  Negative for joint pain, myalgias and neck pain.  Skin:  Negative for rash.  Neurological:  Negative for headaches.  Psychiatric/Behavioral:  Negative for depression and substance abuse. The patient is not nervous/anxious.      Past Medical History:  Diagnosis Date   Anxiety 2020   Bipolar 1 disorder (HCC) 2020   Cystic fibrosis carrier    Depression    no meds   Eczema    HIV (human immunodeficiency virus infection) (HCC) 04/2022   Incompetent cervix    Insomnia 2020   PTSD (post-traumatic stress disorder) 2020   Substance abuse (HCC)    alcohol, cocaine, marijuana   UTI (urinary tract infection)     Outpatient Medications Prior to Visit  Medication Sig Dispense Refill   dolutegravir -lamiVUDine  (DOVATO ) 50-300 MG tablet Take 1 tablet by mouth daily. 30 tablet 11   escitalopram  (LEXAPRO ) 10 MG tablet Take 1 tablet (10 mg total) by mouth daily. One-time refill placed. Further refills to come from regular providers. 30 tablet 1   gabapentin  (NEURONTIN ) 300 MG capsule Take 1 capsule (300 mg total) by mouth 3 (three) times  daily. 90 capsule 1   naproxen  (NAPROSYN ) 500 MG tablet Take one tablet (500 mg dose) by mouth 2 (two) times daily with meals. 25 tablet 0   amoxicillin -clavulanate (AUGMENTIN ) 875-125 MG tablet Take 1 tablet by mouth 2 (two) times daily for 7 days (Patient not taking: Reported on 01/21/2024) 14 tablet 0   cyclobenzaprine  (FLEXERIL ) 10 MG tablet Take one tablet (10 mg dose) by mouth 3 (three) times a day as needed for Muscle spasms. (Patient not taking: Reported on 01/21/2024) 15 tablet 0   mirtazapine  (REMERON ) 30 MG tablet Take 1 tablet (30 mg total) by mouth at bedtime. 30 tablet 1   propranolol  (INDERAL ) 10 MG tablet Take 1 tablet (10 mg total) by mouth as needed (Take before  situations that cause anxiety.). (Patient not taking: Reported on 01/21/2024) 30 tablet 1   No facility-administered medications prior to visit.     No Known Allergies  Social History   Tobacco Use   Smoking status: Every Day    Types: E-cigarettes    Last attempt to quit: 04/2022    Years since quitting: 1.7    Passive exposure: Current   Smokeless tobacco: Never  Vaping Use   Vaping status: Every Day   Substances: Nicotine   Substance Use Topics   Alcohol use: Not Currently   Drug use: Not Currently    Frequency: 1.0 times per week    Types: Marijuana    Comment: last day 09/26/22-    Social History   Substance and Sexual Activity  Sexual Activity Not Currently     Objective   Objective:   Vitals:   01/21/24 1451  BP: 126/86  Pulse: 97  Temp: 98.6 F (37 C)  TempSrc: Temporal  Weight: 111 lb (50.3 kg)  Height: 5' 1.5" (1.562 m)    Body mass index is 20.63 kg/m.  Physical Exam Constitutional:      Appearance: Normal appearance. She is not ill-appearing.  HENT:     Mouth/Throat:     Mouth: Mucous membranes are moist.     Pharynx: Oropharynx is clear.  Eyes:     General: No scleral icterus. Cardiovascular:     Rate and Rhythm: Normal rate and regular rhythm.  Pulmonary:     Effort: Pulmonary effort is normal.  Neurological:     Mental Status: She is oriented to person, place, and time.  Psychiatric:        Mood and Affect: Mood normal.        Thought Content: Thought content normal.     Lab Results Lab Results  Component Value Date   WBC 10.0 01/21/2024   HGB 12.4 01/21/2024   HCT 36.7 01/21/2024   MCV 94.8 01/21/2024   PLT 260 01/21/2024    Lab Results  Component Value Date   CREATININE 0.75 01/21/2024   BUN 8 01/21/2024   NA 139 01/21/2024   K 4.3 01/21/2024   CL 104 01/21/2024   CO2 29 01/21/2024    Lab Results  Component Value Date   ALT 7 01/21/2024   AST 13 01/21/2024   ALKPHOS 70 05/22/2023   BILITOT 0.6 01/21/2024     Lab Results  Component Value Date   CHOL 148 05/22/2023   HDL 62 05/22/2023   LDLCALC 71 05/22/2023   TRIG 80 05/22/2023   CHOLHDL 2.4 05/22/2023   HIV 1 RNA Quant (Copies/mL)  Date Value  10/09/2023 Not Detected  05/19/2023 Not Detected  12/09/2022 Not Detected   CD4 T  Cell Abs (/uL)  Date Value  05/19/2023 890  11/04/2022 1,148  05/14/2022 1,287      Assessment & Plan:     HIV infection, asymptomatic - VL < 20, CD4 > 500 -  HIV well-controlled with antiretroviral therapy. Viral load undetectable, CD4 count 1237. Explained lifelong nature of HIV, U=U concept, and benefits of adherence to therapy. Discussed PrEP for partners and shared decision-making for preventive measures. No further family planning. Waiting for new psychiatry appointment for mental health upkeep. No dental needs today.  Pap smear due 2026 - Continue current antiretroviral therapy (Dovato ) with refills as needed. - Monitor CD4 count and viral load every six months unless otherwise indicated. - Discuss PrEP options with partner if desired for additional reassurance. - Discussed U=U and referred partner to PrEP clinic   Dysuria -  Symptoms suggest urinary tract infection. No fever or abdominal pain however. No antibiotic allergies, familiar with Keflex . - Order urinalysis with culture given urinary symptoms - Prescribe Keflex  (cephalexin ) 500 mg twice a day for 5 days.   Poor Sleep -  Bipolar D/O -  Awaiting new psychiatry evaluation to help with best recommendations given she has had a few intolerances to other medications in the past.   Recording duration: 20 minutes       Meds ordered this encounter  Medications   cephALEXin  (KEFLEX ) 500 MG capsule    Sig: Take 1 capsule (500 mg total) by mouth 2 (two) times daily for 5 days.    Dispense:  10 capsule    Refill:  0    Supervising Provider:   Liane Redman [phone]   Orders Placed This Encounter  Procedures   Urine Culture   MICROSCOPIC  MESSAGE   Urinalysis, Routine w reflex microscopic   COMPLETE METABOLIC PANEL WITHOUT GFR   HIV 1 RNA quant-no reflex-bld   CBC   RPR   Return in about 6 months (around 07/22/2024).   Gibson Kurtz, MSN, NP-C [facility] Pager: [phone] Office: [phone]  01/22/24  2:46 PM

## 2024-01-22 LAB — URINALYSIS, ROUTINE W REFLEX MICROSCOPIC
Bilirubin Urine: NEGATIVE
Glucose, UA: NEGATIVE
Hgb urine dipstick: NEGATIVE
Hyaline Cast: NONE SEEN /LPF
Ketones, ur: NEGATIVE
Nitrite: NEGATIVE
Protein, ur: NEGATIVE
RBC / HPF: NONE SEEN /HPF (ref 0–2)
Specific Gravity, Urine: 1.014 (ref 1.001–1.035)
pH: 5.5 (ref 5.0–8.0)

## 2024-01-22 LAB — URINE CULTURE
MICRO NUMBER:: 16396247
SPECIMEN QUALITY:: ADEQUATE

## 2024-01-22 LAB — MICROSCOPIC MESSAGE

## 2024-01-23 LAB — COMPLETE METABOLIC PANEL WITHOUT GFR
AG Ratio: 2.1 (calc) (ref 1.0–2.5)
ALT: 7 U/L (ref 6–29)
AST: 13 U/L (ref 10–30)
Albumin: 4.7 g/dL (ref 3.6–5.1)
Alkaline phosphatase (APISO): 59 U/L (ref 31–125)
BUN: 8 mg/dL (ref 7–25)
CO2: 29 mmol/L (ref 20–32)
Calcium: 9.5 mg/dL (ref 8.6–10.2)
Chloride: 104 mmol/L (ref 98–110)
Creat: 0.75 mg/dL (ref 0.50–0.96)
Globulin: 2.2 g/dL (ref 1.9–3.7)
Glucose, Bld: 70 mg/dL (ref 65–99)
Potassium: 4.3 mmol/L (ref 3.5–5.3)
Sodium: 139 mmol/L (ref 135–146)
Total Bilirubin: 0.6 mg/dL (ref 0.2–1.2)
Total Protein: 6.9 g/dL (ref 6.1–8.1)

## 2024-01-23 LAB — CBC
HCT: 36.7 % (ref 35.0–45.0)
Hemoglobin: 12.4 g/dL (ref 11.7–15.5)
MCH: 32 pg (ref 27.0–33.0)
MCHC: 33.8 g/dL (ref 32.0–36.0)
MCV: 94.8 fL (ref 80.0–100.0)
MPV: 11.9 fL (ref 7.5–12.5)
Platelets: 260 10*3/uL (ref 140–400)
RBC: 3.87 10*6/uL (ref 3.80–5.10)
RDW: 12.6 % (ref 11.0–15.0)
WBC: 10 10*3/uL (ref 3.8–10.8)

## 2024-01-23 LAB — HIV-1 RNA QUANT-NO REFLEX-BLD
HIV 1 RNA Quant: NOT DETECTED {copies}/mL
HIV-1 RNA Quant, Log: NOT DETECTED {Log_copies}/mL

## 2024-01-23 LAB — RPR: RPR Ser Ql: NONREACTIVE

## 2024-01-26 ENCOUNTER — Other Ambulatory Visit (HOSPITAL_COMMUNITY): Payer: Self-pay

## 2024-01-26 ENCOUNTER — Other Ambulatory Visit: Payer: Self-pay

## 2024-01-26 NOTE — Progress Notes (Signed)
 Specialty Pharmacy Ongoing Clinical Assessment Note  Nicole Morgan is a 28 y.o. female who is being followed by the specialty pharmacy service for RxSp HIV   Patient's specialty medication(s) reviewed today: Dolutegravir -lamiVUDine  (Dovato )   Missed doses in the last 4 weeks: 0   Patient/Caregiver did not have any additional questions or concerns.   Therapeutic benefit summary: Patient is achieving benefit   Adverse events/side effects summary: No adverse events/side effects   Patient's therapy is appropriate to: Continue    Goals Addressed             This Visit's Progress    Achieve Undetectable HIV Viral Load < 20   On track    Patient is on track. Patient will maintain adherence.  Her viral load remains undetectable as of 01/21/24.          Follow up:  6 months  Malachi Screws Specialty Pharmacist

## 2024-01-26 NOTE — Progress Notes (Signed)
 Specialty Pharmacy Refill Coordination Note  Nicole Morgan is a 28 y.o. female contacted today regarding refills of specialty medication(s) Dolutegravir -lamiVUDine  (Dovato )   Patient requested Delivery   Delivery date: 02/05/24   Verified address: [address] [place] [address]   Medication will be filled on 02/04/24.

## 2024-01-27 ENCOUNTER — Ambulatory Visit

## 2024-01-27 ENCOUNTER — Encounter (HOSPITAL_COMMUNITY): Payer: Self-pay | Admitting: Radiology

## 2024-01-27 ENCOUNTER — Other Ambulatory Visit: Payer: Self-pay

## 2024-01-27 ENCOUNTER — Emergency Department (HOSPITAL_COMMUNITY)
Admission: EM | Admit: 2024-01-27 | Discharge: 2024-01-27 | Disposition: A | Discharge status: Home / Self Care | Attending: Emergency Medicine | Admitting: Emergency Medicine

## 2024-01-27 ENCOUNTER — Emergency Department (HOSPITAL_COMMUNITY)

## 2024-01-27 DIAGNOSIS — M5441 Lumbago with sciatica, right side: Secondary | ICD-10-CM | POA: Insufficient documentation

## 2024-01-27 DIAGNOSIS — M5410 Radiculopathy, site unspecified: Secondary | ICD-10-CM

## 2024-01-27 DIAGNOSIS — M545 Low back pain, unspecified: Secondary | ICD-10-CM

## 2024-01-27 DIAGNOSIS — Z21 Asymptomatic human immunodeficiency virus [HIV] infection status: Secondary | ICD-10-CM | POA: Diagnosis not present

## 2024-01-27 DIAGNOSIS — M5416 Radiculopathy, lumbar region: Secondary | ICD-10-CM | POA: Insufficient documentation

## 2024-01-27 DIAGNOSIS — M5442 Lumbago with sciatica, left side: Secondary | ICD-10-CM | POA: Diagnosis not present

## 2024-01-27 MED ORDER — GABAPENTIN 300 MG PO CAPS: 300.0000 mg | ORAL_CAPSULE | Freq: Three times a day (TID) | ORAL | 0 refills | Status: AC

## 2024-01-27 MED ORDER — CYCLOBENZAPRINE HCL 10 MG PO TABS: 10.0000 mg | ORAL_TABLET | Freq: Two times a day (BID) | ORAL | 0 refills | Status: AC | PRN

## 2024-01-27 MED ORDER — METHYLPREDNISOLONE 4 MG PO TBPK
ORAL_TABLET | ORAL | 0 refills | Status: AC
Start: 2024-01-27 — End: ?

## 2024-01-27 MED ORDER — LIDOCAINE 5 % EX PTCH
1.0000 | MEDICATED_PATCH | CUTANEOUS | 0 refills | Status: AC
Start: 2024-01-27 — End: ?

## 2024-01-27 NOTE — Discharge Instructions (Signed)
 Your history, exam, and workup today seem consistent with nerve pain going down your legs causing the symptoms you been experiencing.  The MRI did not show acute nerve injury at this time however with the nerve pain going down your legs we feel is appropriate to give you prescription for some steroids, numbing patches, muscle relaxant, and refill your gabapentin .  Please follow-up with either your sports medicine doctor or the neurosurgical back doctors for close follow-up.  If any symptoms change or worsen acutely, please return to the nearest emergency department.

## 2024-01-27 NOTE — ED Notes (Signed)
 Pt in MRI.

## 2024-01-27 NOTE — ED Provider Notes (Incomplete)
 [place] [facility] AT [facility] Provider Note   CSN: [id number] Arrival date & time: 01/27/24  1536     History {Add pertinent medical, surgical, social history, OB history to HPI:1} Chief Complaint  Patient presents with  . Back Pain    Nicole Morgan is a 28 y.o. female.  The history is provided by the patient, medical records and a significant other. No language interpreter was used.  Back Pain Location:  Lumbar spine Quality:  Aching Radiates to:  R posterior upper leg and L posterior upper leg Pain severity:  Moderate Pain is:  Unable to specify Onset quality:  Gradual Duration:  12 months Timing:  Constant Progression:  Waxing and waning Chronicity:  Chronic Relieved by:  Nothing Worsened by:  Nothing Ineffective treatments:  None tried Associated symptoms: bladder incontinence (several months ago), leg pain and tingling   Associated symptoms: no abdominal pain, no chest pain, no dysuria (resolved now), no fever, no headaches, no numbness, no paresthesias, no perianal numbness, no weakness and no weight loss        Home Medications Prior to Admission medications   Medication Sig Start Date End Date Taking? Authorizing Provider  dolutegravir -lamiVUDine  (DOVATO ) 50-300 MG tablet Take 1 tablet by mouth daily. 10/09/23   Orson Blalock, NP  escitalopram  (LEXAPRO ) 10 MG tablet Take 1 tablet (10 mg total) by mouth daily. One-time refill placed. Further refills to come from regular providers. 12/16/23 02/15/24  Farris Hong, PA-C  gabapentin  (NEURONTIN ) 300 MG capsule Take 1 capsule (300 mg total) by mouth 3 (three) times daily. 12/17/23   Orson Blalock, NP  naproxen  (NAPROSYN ) 500 MG tablet Take one tablet (500 mg dose) by mouth 2 (two) times daily with meals. 11/17/23         Allergies    Patient has no known allergies.    Review of Systems   Review of Systems  Constitutional:  Negative for chills, fatigue, fever and weight loss.   HENT:  Negative for congestion.   Respiratory:  Negative for cough, chest tightness, shortness of breath and wheezing.   Cardiovascular:  Negative for chest pain.  Gastrointestinal:  Negative for abdominal pain, constipation, diarrhea, nausea and vomiting.  Genitourinary:  Positive for bladder incontinence (several months ago). Negative for dysuria (resolved now), flank pain and frequency.  Musculoskeletal:  Positive for back pain. Negative for neck pain and neck stiffness.  Skin:  Negative for rash and wound.  Neurological:  Positive for tingling. Negative for weakness, numbness, headaches and paresthesias.  Psychiatric/Behavioral:  Negative for agitation and confusion.   All other systems reviewed and are negative.   Physical Exam Updated Vital Signs BP (!) 113/90 (BP Location: Right Arm)   Pulse 77   Temp 98.2 F (36.8 C) (Oral)   Resp 16   Ht 5\' 1"  (1.549 m)   Wt 51.3 kg   LMP 01/24/2024   SpO2 100%   BMI 21.35 kg/m  Physical Exam Vitals and nursing note reviewed.  Constitutional:      General: She is not in acute distress.    Appearance: She is well-developed. She is not ill-appearing, toxic-appearing or diaphoretic.  HENT:     Head: Normocephalic and atraumatic.     Mouth/Throat:     Mouth: Mucous membranes are moist.     Pharynx: No oropharyngeal exudate or posterior oropharyngeal erythema.  Eyes:     Conjunctiva/sclera: Conjunctivae normal.  Cardiovascular:     Rate and Rhythm: Normal  rate and regular rhythm.     Heart sounds: No murmur heard. Pulmonary:     Effort: Pulmonary effort is normal. No respiratory distress.     Breath sounds: Normal breath sounds. No wheezing, rhonchi or rales.  Chest:     Chest wall: No tenderness.  Abdominal:     General: Abdomen is flat.     Palpations: Abdomen is soft.     Tenderness: There is no abdominal tenderness.  Musculoskeletal:        General: Tenderness present. No swelling.     Cervical back: Neck supple. No  tenderness.     Thoracic back: No signs of trauma or tenderness.     Lumbar back: Spasms and tenderness present.       Back:     Right lower leg: No edema.     Left lower leg: No edema.  Skin:    General: Skin is warm and dry.     Capillary Refill: Capillary refill takes less than 2 seconds.     Findings: No erythema or rash.  Neurological:     General: No focal deficit present.     Mental Status: She is alert.     Sensory: No sensory deficit.     Motor: No weakness.  Psychiatric:        Mood and Affect: Mood normal.     ED Results / Procedures / Treatments   Labs (all labs ordered are listed, but only abnormal results are displayed) Labs Reviewed - No data to display  EKG None  Radiology MR LUMBAR SPINE WO CONTRAST Result Date: 01/27/2024 CLINICAL DATA:  Lower back pain.  Cauda equina syndrome suspected. EXAM: MRI LUMBAR SPINE WITHOUT CONTRAST TECHNIQUE: Multiplanar, multisequence MR imaging of the lumbar spine was performed. No intravenous contrast was administered. COMPARISON:  None Available. FINDINGS: Segmentation: There are 5 non-rib-bearing lumbar-type vertebral bodies. Alignment:  Normal alignment. Vertebrae: Vertebral body heights are maintained. Intervertebral disc spaces are maintained. Normal disc hydration. Normal marrow signal. 5 mm tiny fat intensity benign hemangioma within the superior L4 vertebral body. No acute fracture. No destructive bone lesion. Conus medullaris and cauda equina: Conus extends to the L1-L2 level. Conus and cauda equina appear normal. Paraspinal and other soft tissues: Limited images of the retroperitoneum are unremarkable. Disc levels: T11-12 and T12-L1: Unremarkable. L1-2: No posterior disc bulge, central canal narrowing, or neuroforaminal stenosis. L2-3: No posterior disc bulge, central canal narrowing, or neuroforaminal stenosis. L3-4: Mild bilateral facet joint hypertrophy. No posterior disc bulge, central canal narrowing, or neuroforaminal  stenosis. L4-5: Mild bilateral facet joint hypertrophy. Minimal facet joint effusions. No posterior disc bulge, central canal narrowing, or neuroforaminal stenosis. L5-S1: No posterior disc bulge, central canal narrowing, or neuroforaminal stenosis. IMPRESSION: 1. Mild bilateral facet joint hypertrophy at L3-4 and L4-5. 2. No posterior disc bulge, central canal stenosis, or neural foraminal stenosis. Electronically Signed   By: Bertina Broccoli M.D.   On: 01/27/2024 20:08    Procedures Procedures  {Document cardiac monitor, telemetry assessment procedure when appropriate:1}  Medications Ordered in ED Medications - No data to display  ED Course/ Medical Decision Making/ A&P   {   Click here for ABCD2, HEART and other calculatorsREFRESH Note before signing :1}                              Medical Decision Making Risk Prescription drug management.    Danean P Loven is a 28 y.o.  female                {Document critical care time when appropriate:1} {Document review of labs and clinical decision tools ie heart score, Chads2Vasc2 etc:1}  {Document your independent review of radiology images, and any outside records:1} {Document your discussion with family members, caretakers, and with consultants:1} {Document social determinants of health affecting pt's care:1} {Document your decision making why or why not admission, treatments were needed:1} Final Clinical Impression(s) / ED Diagnoses Final diagnoses:  Bilateral low back pain with bilateral sciatica, unspecified chronicity  Radicular pain  Low back pain, unspecified back pain laterality, unspecified chronicity, unspecified whether sciatica present    Rx / DC Orders ED Discharge Orders          Ordered    gabapentin  (NEURONTIN ) 300 MG capsule  3 times daily        01/27/24 2038    lidocaine  (LIDODERM ) 5 %  Every 24 hours        01/27/24 2038    cyclobenzaprine  (FLEXERIL ) 10 MG tablet  2 times daily PRN        01/27/24 2038     methylPREDNISolone (MEDROL DOSEPAK) 4 MG TBPK tablet        01/27/24 2038           Clinical Impression: 1. Bilateral low back pain with bilateral sciatica, unspecified chronicity   2. Radicular pain   3. Low back pain, unspecified back pain laterality, unspecified chronicity, unspecified whether sciatica present     Disposition: Discharge  Condition: Good  I have discussed the results, Dx and Tx plan with the pt(& family if present). He/she/they expressed understanding and agree(s) with the plan. Discharge instructions discussed at great length. Strict return precautions discussed and pt &/or family have verbalized understanding of the instructions. No further questions at time of discharge.    Discharge Medication List as of 01/27/2024  9:04 PM     START taking these medications   Details  cyclobenzaprine  (FLEXERIL ) 10 MG tablet Take 1 tablet (10 mg total) by mouth 2 (two) times daily as needed for muscle spasms., Starting Tue 01/27/2024, Normal    gabapentin  (NEURONTIN ) 300 MG capsule Take 1 capsule (300 mg total) by mouth 3 (three) times daily., Starting Tue 01/27/2024, Normal    lidocaine  (LIDODERM ) 5 % Place 1 patch onto the skin daily. Remove & Discard patch within 12 hours or as directed by MD, Starting Tue 01/27/2024, Normal    methylPREDNISolone (MEDROL DOSEPAK) 4 MG TBPK tablet Please follow directions on Dosepak, Normal        Follow Up: Pa, [facility] [address] [place] [address] [phone]   with neurosurgery  your sports medicine doctor

## 2024-01-27 NOTE — ED Provider Notes (Signed)
  [place] [facility] AT [facility] Provider Note   CSN: [id number] Arrival date & time: 01/27/24  1536     History {Add pertinent medical, surgical, social history, OB history to HPI:1} Chief Complaint  Patient presents with  . Back Pain    Nicole Morgan is a 28 y.o. female.   Back Pain      Home Medications Prior to Admission medications   Medication Sig Start Date End Date Taking? Authorizing Provider  dolutegravir -lamiVUDine  (DOVATO ) 50-300 MG tablet Take 1 tablet by mouth daily. 10/09/23   Orson Blalock, NP  escitalopram  (LEXAPRO ) 10 MG tablet Take 1 tablet (10 mg total) by mouth daily. One-time refill placed. Further refills to come from regular providers. 12/16/23 02/15/24  Farris Hong, PA-C  gabapentin  (NEURONTIN ) 300 MG capsule Take 1 capsule (300 mg total) by mouth 3 (three) times daily. 12/17/23   Orson Blalock, NP  naproxen  (NAPROSYN ) 500 MG tablet Take one tablet (500 mg dose) by mouth 2 (two) times daily with meals. 11/17/23         Allergies    Patient has no known allergies.    Review of Systems   Review of Systems  Musculoskeletal:  Positive for back pain.    Physical Exam Updated Vital Signs BP (!) 113/90 (BP Location: Right Arm)   Pulse 77   Temp 98.2 F (36.8 C) (Oral)   Resp 16   Ht 5\' 1"  (1.549 m)   Wt 51.3 kg   LMP 01/24/2024   SpO2 100%   BMI 21.35 kg/m  Physical Exam  ED Results / Procedures / Treatments   Labs (all labs ordered are listed, but only abnormal results are displayed) Labs Reviewed - No data to display  EKG None  Radiology No results found.  Procedures Procedures  {Document cardiac monitor, telemetry assessment procedure when appropriate:1}  Medications Ordered in ED Medications - No data to display  ED Course/ Medical Decision Making/ A&P   {   Click here for ABCD2, HEART and other calculatorsREFRESH Note before signing :1}                              Medical Decision  Making Risk Prescription drug management.   ***  {Document critical care time when appropriate:1} {Document review of labs and clinical decision tools ie heart score, Chads2Vasc2 etc:1}  {Document your independent review of radiology images, and any outside records:1} {Document your discussion with family members, caretakers, and with consultants:1} {Document social determinants of health affecting pt's care:1} {Document your decision making why or why not admission, treatments were needed:1} Final Clinical Impression(s) / ED Diagnoses Final diagnoses:  None    Rx / DC Orders ED Discharge Orders     None
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
## 2024-01-27 NOTE — ED Notes (Signed)
 Patient transported to MRI

## 2024-02-02 ENCOUNTER — Other Ambulatory Visit (HOSPITAL_COMMUNITY): Payer: Self-pay

## 2024-02-02 MED ORDER — GABAPENTIN 300 MG PO CAPS
600.0000 mg | ORAL_CAPSULE | Freq: Three times a day (TID) | ORAL | 0 refills | Status: DC
  Filled 2024-02-02: qty 180, 30d supply, fill #0

## 2024-02-04 ENCOUNTER — Other Ambulatory Visit: Payer: Self-pay

## 2024-02-13 ENCOUNTER — Other Ambulatory Visit (HOSPITAL_COMMUNITY): Payer: Self-pay

## 2024-02-13 ENCOUNTER — Other Ambulatory Visit (HOSPITAL_BASED_OUTPATIENT_CLINIC_OR_DEPARTMENT_OTHER): Payer: Self-pay

## 2024-02-13 MED ORDER — ESCITALOPRAM OXALATE 10 MG PO TABS
10.0000 mg | ORAL_TABLET | Freq: Every day | ORAL | 0 refills | Status: DC
  Filled 2024-02-13: qty 30, 30d supply, fill #0

## 2024-02-13 MED ORDER — LIDOCAINE 5 % EX PTCH
MEDICATED_PATCH | Freq: Every day | CUTANEOUS | 0 refills | Status: AC
Start: 2024-02-13 — End: ?
  Filled 2024-02-13: qty 30, 30d supply, fill #0

## 2024-02-20 ENCOUNTER — Other Ambulatory Visit (HOSPITAL_COMMUNITY): Payer: Self-pay

## 2024-02-20 MED ORDER — ESCITALOPRAM OXALATE 20 MG PO TABS
20.0000 mg | ORAL_TABLET | Freq: Every day | ORAL | 5 refills | Status: AC
  Filled 2024-02-20: qty 30, 30d supply, fill #0
  Filled 2024-03-22 (×2): qty 30, 30d supply, fill #1
  Filled 2024-07-07: qty 30, 30d supply, fill #2

## 2024-02-20 MED ORDER — QUETIAPINE FUMARATE 50 MG PO TABS
50.0000 mg | ORAL_TABLET | Freq: Every day | ORAL | 5 refills | Status: AC
  Filled 2024-02-20: qty 30, 30d supply, fill #0
  Filled 2024-03-22 (×2): qty 30, 30d supply, fill #1

## 2024-02-21 ENCOUNTER — Other Ambulatory Visit (HOSPITAL_COMMUNITY): Payer: Self-pay

## 2024-02-24 ENCOUNTER — Other Ambulatory Visit (HOSPITAL_COMMUNITY): Payer: Self-pay

## 2024-02-24 ENCOUNTER — Ambulatory Visit: Payer: Medicaid Other | Admitting: Family Medicine

## 2024-02-29 ENCOUNTER — Other Ambulatory Visit (HOSPITAL_COMMUNITY): Payer: Self-pay

## 2024-03-01 ENCOUNTER — Other Ambulatory Visit: Payer: Self-pay

## 2024-03-03 ENCOUNTER — Other Ambulatory Visit: Payer: Self-pay

## 2024-03-04 ENCOUNTER — Other Ambulatory Visit: Payer: Self-pay

## 2024-03-04 NOTE — Progress Notes (Signed)
 Specialty Pharmacy Refill Coordination Note  Nicole Morgan is a 28 y.o. female contacted today regarding refills of specialty medication(s) No data recorded  Patient requested Delivery   Delivery date: 03/05/24   Verified address: [address] [place] [place] [address]   Medication will be filled on 03/04/24.

## 2024-03-05 ENCOUNTER — Other Ambulatory Visit: Payer: Self-pay

## 2024-03-08 ENCOUNTER — Other Ambulatory Visit: Payer: Self-pay

## 2024-03-09 ENCOUNTER — Ambulatory Visit

## 2024-03-09 ENCOUNTER — Other Ambulatory Visit (HOSPITAL_COMMUNITY): Payer: Self-pay

## 2024-03-09 ENCOUNTER — Other Ambulatory Visit: Payer: Self-pay

## 2024-03-09 MED ORDER — ESCITALOPRAM OXALATE 20 MG PO TABS
20.0000 mg | ORAL_TABLET | Freq: Every day | ORAL | 5 refills | Status: AC
  Filled 2024-03-09 – 2024-04-17 (×2): qty 30, 30d supply, fill #0

## 2024-03-17 ENCOUNTER — Other Ambulatory Visit: Payer: Self-pay

## 2024-03-17 ENCOUNTER — Other Ambulatory Visit (HOSPITAL_COMMUNITY): Payer: Self-pay

## 2024-03-22 ENCOUNTER — Other Ambulatory Visit: Payer: Self-pay

## 2024-03-22 ENCOUNTER — Other Ambulatory Visit (HOSPITAL_COMMUNITY): Payer: Self-pay

## 2024-03-22 MED ORDER — METHOCARBAMOL 500 MG PO TABS
500.0000 mg | ORAL_TABLET | Freq: Every day | ORAL | 2 refills | Status: DC
  Filled 2024-03-22: qty 30, 30d supply, fill #0
  Filled 2024-06-14: qty 30, 30d supply, fill #1
  Filled 2024-07-07: qty 30, 30d supply, fill #2

## 2024-03-22 MED ORDER — CELECOXIB 200 MG PO CAPS
200.0000 mg | ORAL_CAPSULE | Freq: Two times a day (BID) | ORAL | 5 refills | Status: AC
  Filled 2024-03-22: qty 60, 30d supply, fill #0
  Filled 2024-05-17: qty 60, 30d supply, fill #1
  Filled 2024-07-19: qty 60, 30d supply, fill #2
  Filled 2024-08-14: qty 60, 30d supply, fill #3
  Filled 2024-09-07 (×2): qty 60, 30d supply, fill #4
  Filled 2024-10-25: qty 60, 30d supply, fill #5

## 2024-03-24 ENCOUNTER — Other Ambulatory Visit (HOSPITAL_COMMUNITY): Payer: Self-pay

## 2024-03-24 MED ORDER — VALACYCLOVIR HCL 1 G PO TABS
1000.0000 mg | ORAL_TABLET | Freq: Every day | ORAL | 11 refills | Status: AC
  Filled 2024-03-24: qty 30, 30d supply, fill #0
  Filled 2024-05-05: qty 30, 30d supply, fill #1
  Filled 2024-07-19 (×2): qty 30, 30d supply, fill #2
  Filled 2024-10-05: qty 30, 30d supply, fill #3

## 2024-03-31 ENCOUNTER — Other Ambulatory Visit: Payer: Self-pay | Admitting: Pharmacy Technician

## 2024-03-31 ENCOUNTER — Other Ambulatory Visit: Payer: Self-pay

## 2024-03-31 NOTE — Progress Notes (Signed)
 Specialty Pharmacy Refill Coordination Note  Nicole Morgan is a 28 y.o. female contacted today regarding refills of specialty medication(s) Dolutegravir -lamiVUDine  (Dovato )   Patient requested Delivery   Delivery date: 04/07/24   Verified address: [name], [place] [address]   Medication will be filled on 04/06/24.

## 2024-03-31 NOTE — Progress Notes (Signed)
 Clinical Intervention Note  Clinical Intervention Notes: Patient reports starting valacyclovir . No DDIs identified with Dovato    Clinical Intervention Outcomes: Prevention of an adverse drug event   Advertising account planner

## 2024-04-01 ENCOUNTER — Other Ambulatory Visit (HOSPITAL_COMMUNITY): Payer: Self-pay

## 2024-04-01 MED ORDER — GABAPENTIN 300 MG PO CAPS
600.0000 mg | ORAL_CAPSULE | Freq: Three times a day (TID) | ORAL | 3 refills | Status: DC
  Filled 2024-04-01 (×2): qty 180, 30d supply, fill #0
  Filled 2024-05-11: qty 180, 30d supply, fill #1
  Filled 2024-07-07: qty 180, 30d supply, fill #2
  Filled 2024-09-07 (×2): qty 180, 30d supply, fill #3

## 2024-04-01 MED ORDER — QUETIAPINE FUMARATE 50 MG PO TABS
50.0000 mg | ORAL_TABLET | Freq: Every day | ORAL | 5 refills | Status: AC
  Filled 2024-04-01 (×4): qty 60, 30d supply, fill #0
  Filled 2024-05-01: qty 60, 30d supply, fill #1
  Filled 2024-07-07: qty 60, 30d supply, fill #2

## 2024-04-17 ENCOUNTER — Other Ambulatory Visit (HOSPITAL_COMMUNITY): Payer: Self-pay

## 2024-04-21 ENCOUNTER — Encounter (HOSPITAL_BASED_OUTPATIENT_CLINIC_OR_DEPARTMENT_OTHER): Payer: Self-pay

## 2024-04-21 ENCOUNTER — Emergency Department (HOSPITAL_BASED_OUTPATIENT_CLINIC_OR_DEPARTMENT_OTHER)

## 2024-04-21 ENCOUNTER — Other Ambulatory Visit (HOSPITAL_BASED_OUTPATIENT_CLINIC_OR_DEPARTMENT_OTHER): Payer: Self-pay

## 2024-04-21 ENCOUNTER — Emergency Department (HOSPITAL_BASED_OUTPATIENT_CLINIC_OR_DEPARTMENT_OTHER)
Admission: EM | Admit: 2024-04-21 | Discharge: 2024-04-21 | Disposition: A | Discharge status: Home / Self Care | Attending: Emergency Medicine | Admitting: Emergency Medicine

## 2024-04-21 ENCOUNTER — Other Ambulatory Visit: Payer: Self-pay

## 2024-04-21 DIAGNOSIS — R197 Diarrhea, unspecified: Secondary | ICD-10-CM | POA: Diagnosis not present

## 2024-04-21 DIAGNOSIS — Z21 Asymptomatic human immunodeficiency virus [HIV] infection status: Secondary | ICD-10-CM | POA: Diagnosis not present

## 2024-04-21 DIAGNOSIS — R1084 Generalized abdominal pain: Secondary | ICD-10-CM | POA: Insufficient documentation

## 2024-04-21 DIAGNOSIS — R109 Unspecified abdominal pain: Secondary | ICD-10-CM | POA: Diagnosis present

## 2024-04-21 LAB — CBC WITH DIFFERENTIAL/PLATELET
Abs Immature Granulocytes: 0.01 K/uL (ref 0.00–0.07)
Basophils Absolute: 0.1 K/uL (ref 0.0–0.1)
Basophils Relative: 1 %
Eosinophils Absolute: 0.1 K/uL (ref 0.0–0.5)
Eosinophils Relative: 1 %
HCT: 36.7 % (ref 36.0–46.0)
Hemoglobin: 12.4 g/dL (ref 12.0–15.0)
Immature Granulocytes: 0 %
Lymphocytes Relative: 35 %
Lymphs Abs: 2.3 K/uL (ref 0.7–4.0)
MCH: 32.8 pg (ref 26.0–34.0)
MCHC: 33.8 g/dL (ref 30.0–36.0)
MCV: 97.1 fL (ref 80.0–100.0)
Monocytes Absolute: 0.5 K/uL (ref 0.1–1.0)
Monocytes Relative: 8 %
Neutro Abs: 3.6 K/uL (ref 1.7–7.7)
Neutrophils Relative %: 55 %
Platelets: 264 K/uL (ref 150–400)
RBC: 3.78 MIL/uL — ABNORMAL LOW (ref 3.87–5.11)
RDW: 13.7 % (ref 11.5–15.5)
WBC: 6.6 K/uL (ref 4.0–10.5)
nRBC: 0 % (ref 0.0–0.2)

## 2024-04-21 LAB — COMPREHENSIVE METABOLIC PANEL WITH GFR
ALT: 16 U/L (ref 0–44)
AST: 18 U/L (ref 15–41)
Albumin: 4.1 g/dL (ref 3.5–5.0)
Alkaline Phosphatase: 67 U/L (ref 38–126)
Anion gap: 11 (ref 5–15)
BUN: 7 mg/dL (ref 6–20)
CO2: 25 mmol/L (ref 22–32)
Calcium: 8.8 mg/dL — ABNORMAL LOW (ref 8.9–10.3)
Chloride: 104 mmol/L (ref 98–111)
Creatinine, Ser: 0.63 mg/dL (ref 0.44–1.00)
GFR, Estimated: >60 mL/min (ref >60)
Glucose, Bld: 116 mg/dL — ABNORMAL HIGH (ref 70–99)
Potassium: 3.8 mmol/L (ref 3.5–5.1)
Sodium: 140 mmol/L (ref 135–145)
Total Bilirubin: 0.3 mg/dL (ref 0.0–1.2)
Total Protein: 6.4 g/dL — ABNORMAL LOW (ref 6.5–8.1)

## 2024-04-21 LAB — LIPASE, BLOOD: Lipase: 29 U/L (ref 11–51)

## 2024-04-21 LAB — HCG, SERUM, QUALITATIVE: Preg, Serum: NEGATIVE

## 2024-04-21 MED ORDER — SODIUM CHLORIDE 0.9 % IV BOLUS
1000.0000 mL | Freq: Once | INTRAVENOUS | Status: AC
  Administered 2024-04-21: 1000 mL via INTRAVENOUS

## 2024-04-21 MED ORDER — IOHEXOL 300 MG/ML  SOLN
100.0000 mL | Freq: Once | INTRAMUSCULAR | Status: AC | PRN
  Administered 2024-04-21: 100 mL via INTRAVENOUS

## 2024-04-21 MED ORDER — DICYCLOMINE HCL 10 MG PO CAPS
10.0000 mg | ORAL_CAPSULE | Freq: Once | ORAL | Status: AC
  Administered 2024-04-21: 10 mg via ORAL
  Filled 2024-04-21: qty 1

## 2024-04-21 MED ORDER — DICYCLOMINE HCL 20 MG PO TABS
20.0000 mg | ORAL_TABLET | Freq: Two times a day (BID) | ORAL | 0 refills | Status: AC
  Filled 2024-04-21: qty 20, 10d supply, fill #0

## 2024-04-21 MED ORDER — LOPERAMIDE HCL 2 MG PO CAPS
2.0000 mg | ORAL_CAPSULE | Freq: Four times a day (QID) | ORAL | 0 refills | Status: AC | PRN
  Filled 2024-04-21: qty 12, 3d supply, fill #0

## 2024-04-21 NOTE — ED Provider Notes (Signed)
 [place] [facility] AT [facility] Provider Note   CSN: [id number] Arrival date & time: 04/21/24  1020     Patient presents with: Abdominal Pain   Nicole Morgan is a 28 y.o. female.   Patient here with generalized abdominal pain and cramping.  Diarrhea for the last couple days.  History of HIV compliant with medications.  She denies any fever chills headache.  She has not had any nausea or vomiting.  Denies any sick contacts.  No recent antibiotics.  She has a history of depression PTSD substance abuse.  Denies any chest pain shortness of breath cough sputum production.  Symptoms now for the last few days.  Nothing makes it worse or better.  Has not tried Imodium .  The history is provided by the patient.       Prior to Admission medications   Medication Sig Start Date End Date Taking? Authorizing Provider  dicyclomine  (BENTYL ) 20 MG tablet Take 1 tablet (20 mg total) by mouth 2 (two) times daily. 04/21/24  Yes Ardena Gangl, DO  loperamide  (IMODIUM ) 2 MG capsule Take 1 capsule (2 mg total) by mouth 4 (four) times daily as needed for diarrhea or loose stools. 04/21/24  Yes Kobe Ofallon, DO  celecoxib  (CELEBREX ) 200 MG capsule Take 1 capsule (200 mg total) by mouth 2 (two) times daily. 03/22/24     cyclobenzaprine  (FLEXERIL ) 10 MG tablet Take 1 tablet (10 mg total) by mouth 2 (two) times daily as needed for muscle spasms. 01/27/24   Tegeler, Lonni PARAS, MD  dolutegravir -lamiVUDine  (DOVATO ) 50-300 MG tablet Take 1 tablet by mouth daily. 10/09/23   Melvenia Corean SAILOR, NP  escitalopram  (LEXAPRO ) 20 MG tablet Take 1 tablet (20 mg total) by mouth daily. 02/20/24     escitalopram  (LEXAPRO ) 20 MG tablet Take 1 tablet (20 mg total) by mouth daily. 03/09/24     gabapentin  (NEURONTIN ) 300 MG capsule Take 1 capsule (300 mg total) by mouth 3 (three) times daily. 01/27/24   Tegeler, Lonni PARAS, MD  gabapentin  (NEURONTIN ) 300 MG capsule Take 2 capsules (600 mg total) by mouth 3  (three) times daily. 04/01/24     lidocaine  (LIDODERM ) 5 % Place 1 patch onto the skin daily. Remove & Discard patch within 12 hours or as directed by MD 01/27/24   Tegeler, Lonni PARAS, MD  lidocaine  (LIDODERM ) 5 % Place 1 patch onto the skin daily. 02/13/24     methocarbamol  (ROBAXIN ) 500 MG tablet Take 1 tablet (500 mg total) by mouth 1 hour before bedtime 03/22/24     methylPREDNISolone  (MEDROL  DOSEPAK) 4 MG TBPK tablet Please follow directions on Dosepak 01/27/24   Tegeler, Lonni PARAS, MD  naproxen  (NAPROSYN ) 500 MG tablet Take one tablet (500 mg dose) by mouth 2 (two) times daily with meals. 11/17/23     QUEtiapine  (SEROQUEL ) 50 MG tablet Take 1 tablet (50 mg total) by mouth at bedtime. 02/20/24     QUEtiapine  (SEROQUEL ) 50 MG tablet Take 1-2 tablets (50-100 mg total) by mouth at bedtime. 04/01/24     valACYclovir  (VALTREX ) 1000 MG tablet Take 1 tablet (1,000 mg total) by mouth daily. 03/24/24       Allergies: Patient has no known allergies.    Review of Systems  Gastrointestinal:  Positive for abdominal pain.    Updated Vital Signs BP 123/83 (BP Location: Right Arm)   Pulse 63   Temp 98 F (36.7 C)   Resp 18   Ht 5' 1 (1.549 m)  Wt 53.7 kg   LMP 04/18/2024 (Approximate)   SpO2 100%   BMI 22.37 kg/m   Physical Exam Vitals and nursing note reviewed.  Constitutional:      General: She is not in acute distress.    Appearance: She is well-developed. She is not ill-appearing.  HENT:     Head: Normocephalic and atraumatic.  Eyes:     Extraocular Movements: Extraocular movements intact.     Conjunctiva/sclera: Conjunctivae normal.  Cardiovascular:     Rate and Rhythm: Normal rate and regular rhythm.     Heart sounds: Normal heart sounds. No murmur heard. Pulmonary:     Effort: Pulmonary effort is normal. No respiratory distress.     Breath sounds: Normal breath sounds.  Abdominal:     General: Abdomen is flat.     Palpations: Abdomen is soft.     Tenderness: There is  generalized abdominal tenderness.  Musculoskeletal:        General: No swelling.     Cervical back: Neck supple.  Skin:    General: Skin is warm and dry.     Capillary Refill: Capillary refill takes less than 2 seconds.  Neurological:     Mental Status: She is alert.  Psychiatric:        Mood and Affect: Mood normal.     (all labs ordered are listed, but only abnormal results are displayed) Labs Reviewed  CBC WITH DIFFERENTIAL/PLATELET - Abnormal; Notable for the following components:      Result Value   RBC 3.78 (*)    All other components within normal limits  COMPREHENSIVE METABOLIC PANEL WITH GFR - Abnormal; Notable for the following components:   Glucose, Bld 116 (*)    Calcium 8.8 (*)    Total Protein 6.4 (*)    All other components within normal limits  LIPASE, BLOOD  HCG, SERUM, QUALITATIVE    EKG: None  Radiology: CT ABDOMEN PELVIS W CONTRAST Result Date: 04/21/2024 CLINICAL DATA:  Abdominal pain, acute, nonlocalized. EXAM: CT ABDOMEN AND PELVIS WITH CONTRAST TECHNIQUE: Multidetector CT imaging of the abdomen and pelvis was performed using the standard protocol following bolus administration of intravenous contrast. RADIATION DOSE REDUCTION: This exam was performed according to the departmental dose-optimization program which includes automated exposure control, adjustment of the mA and/or kV according to patient size and/or use of iterative reconstruction technique. CONTRAST:  OMNIPAQUE  IOHEXOL  300 MG/ML  SOLN COMPARISON:  None Available. FINDINGS: Lower chest: There is a sub 4 mm subpleural nodule in the left lung lower lobe (series 9, image 9). No routine follow-up is recommended. There are patchy atelectatic changes in the visualized lung bases. No overt consolidation. No pleural effusion. The heart is normal in size. No pericardial effusion. Partially seen right breast implant. Hepatobiliary: The liver is normal in size. Non-cirrhotic configuration. No suspicious  mass. No intrahepatic or extrahepatic bile duct dilation. No calcified gallstones. Normal gallbladder wall thickness. No pericholecystic inflammatory changes. Pancreas: Unremarkable. No pancreatic ductal dilatation or surrounding inflammatory changes. Spleen: Within normal limits. No focal lesion. Adrenals/Urinary Tract: Adrenal glands are unremarkable. No suspicious renal mass. No hydronephrosis. No renal or ureteric calculi. Unremarkable urinary bladder. Stomach/Bowel: No disproportionate dilation of the small or large bowel loops. No evidence of abnormal bowel wall thickening or inflammatory changes. The appendix is unremarkable. Vascular/Lymphatic: No ascites or pneumoperitoneum. No abdominal or pelvic lymphadenopathy, by size criteria. No aneurysmal dilation of the major abdominal arteries. Reproductive: The uterus is unremarkable. The ovaries are unremarkable. Other: The  visualized soft tissues and abdominal wall are unremarkable. Musculoskeletal: No suspicious osseous lesions. IMPRESSION: 1. No acute inflammatory process identified within the abdomen or pelvis. 2. Otherwise essentially unremarkable exam, as described above. Electronically Signed   By: Ree Molt M.D.   On: 04/21/2024 11:55     Procedures   Medications Ordered in the ED  sodium chloride  0.9 % bolus 1,000 mL (0 mLs Intravenous Stopped 04/21/24 1142)  dicyclomine  (BENTYL ) capsule 10 mg (10 mg Oral Given 04/21/24 1044)  iohexol  (OMNIPAQUE ) 300 MG/ML solution 100 mL (100 mLs Intravenous Contrast Given 04/21/24 1122)                                    Medical Decision Making Amount and/or Complexity of Data Reviewed Labs: ordered. Radiology: ordered.  Risk Prescription drug management.   Nicole Morgan is here with abdominal pain and diarrhea.  History of HIV.  Blood counts several months ago showed undetectable HIV CD4 labs are overall unremarkable.  She has no fever.  She is well-appearing.  Some generalized tenderness on  abdominal exam.  Differential diagnosis likely of colitis/viral process.  Seems less likely to be surgical process but will get basic labs CT scan abdomen pelvis.  Will give IV fluids and Bentyl  and reevaluate.  Lab work per my review interpretation is unremarkable.  No significant leukocytosis anemia or electrolyte abnormality.  CT scans unremarkable.  No acute findings.  Overall prescribed Imodium  and Bentyl .  I do suspect symptoms secondary to viral process.  Follow-up with primary care doctor if symptoms are not improving.  Return if symptoms worsen.  Discharge.  This chart was dictated using voice recognition software.  Despite best efforts to proofread,  errors can occur which can change the documentation meaning.      Final diagnoses:  Diarrhea, unspecified type    ED Discharge Orders          Ordered    dicyclomine  (BENTYL ) 20 MG tablet  2 times daily        04/21/24 1204    loperamide  (IMODIUM ) 2 MG capsule  4 times daily PRN        04/21/24 1204               Novice Vrba, DO 04/21/24 1204

## 2024-04-21 NOTE — ED Notes (Signed)
 Attempted to obtain stool and urine sample. Pt was unsuccessful.

## 2024-04-21 NOTE — ED Notes (Signed)
 Patient transported to CT

## 2024-04-21 NOTE — ED Triage Notes (Signed)
 States that she has been having diarrhea x 6 days. States that her body aches all over. States that she is also having lower back pain.States that her stomach hurts. Went to UC and was negative for covid and flu. Staes that she is weak. States that she is HIV +

## 2024-04-21 NOTE — Discharge Instructions (Signed)
 Follow-up with your primary care doctor return if symptoms worsen.  Use Imodium  and Bentyl  as needed.

## 2024-05-03 ENCOUNTER — Other Ambulatory Visit: Payer: Self-pay

## 2024-05-03 ENCOUNTER — Other Ambulatory Visit (HOSPITAL_COMMUNITY): Payer: Self-pay

## 2024-05-05 ENCOUNTER — Other Ambulatory Visit: Payer: Self-pay

## 2024-05-05 ENCOUNTER — Other Ambulatory Visit (HOSPITAL_COMMUNITY): Payer: Self-pay

## 2024-05-05 MED ORDER — VALACYCLOVIR HCL 1 G PO TABS
1000.0000 mg | ORAL_TABLET | Freq: Every day | ORAL | 0 refills | Status: DC
  Filled 2024-05-05 – 2024-08-14 (×3): qty 21, 21d supply, fill #0
  Filled ????-??-?? (×2): fill #0

## 2024-05-06 ENCOUNTER — Other Ambulatory Visit (HOSPITAL_COMMUNITY): Payer: Self-pay

## 2024-05-06 MED ORDER — ESCITALOPRAM OXALATE 20 MG PO TABS
20.0000 mg | ORAL_TABLET | Freq: Every day | ORAL | 1 refills | Status: AC
  Filled 2024-05-06 – 2024-05-17 (×2): qty 30, 30d supply, fill #0
  Filled 2024-06-14: qty 30, 30d supply, fill #1

## 2024-05-06 MED ORDER — QUETIAPINE FUMARATE 100 MG PO TABS
100.0000 mg | ORAL_TABLET | Freq: Every evening | ORAL | 1 refills | Status: DC
  Filled 2024-05-06: qty 30, 30d supply, fill #0
  Filled 2024-06-14: qty 30, 30d supply, fill #1

## 2024-05-11 ENCOUNTER — Other Ambulatory Visit (HOSPITAL_COMMUNITY): Payer: Self-pay

## 2024-05-13 ENCOUNTER — Other Ambulatory Visit (HOSPITAL_COMMUNITY): Payer: Self-pay

## 2024-05-14 ENCOUNTER — Other Ambulatory Visit: Payer: Self-pay

## 2024-05-14 NOTE — Progress Notes (Signed)
 Specialty Pharmacy Refill Coordination Note  Nicole Morgan is a 28 y.o. female contacted today regarding refills of specialty medication(s) Dolutegravir -lamiVUDine  (Dovato )   Patient requested Delivery   Delivery date: 05/17/24   Verified address: [address] [place], [place] [address]   Medication will be filled on 05/14/24.

## 2024-05-17 ENCOUNTER — Other Ambulatory Visit (HOSPITAL_COMMUNITY): Payer: Self-pay

## 2024-05-25 ENCOUNTER — Other Ambulatory Visit (HOSPITAL_COMMUNITY): Payer: Self-pay

## 2024-05-25 MED ORDER — VALACYCLOVIR HCL 1 G PO TABS
1000.0000 mg | ORAL_TABLET | Freq: Every day | ORAL | 0 refills | Status: DC
  Filled 2024-05-25 – 2024-05-31 (×2): qty 21, 21d supply, fill #0

## 2024-05-28 ENCOUNTER — Other Ambulatory Visit (HOSPITAL_COMMUNITY): Payer: Self-pay

## 2024-05-28 MED ORDER — LISDEXAMFETAMINE DIMESYLATE 30 MG PO CAPS
30.0000 mg | ORAL_CAPSULE | Freq: Every morning | ORAL | 0 refills | Status: DC
  Filled 2024-05-28 (×2): qty 30, 30d supply, fill #0

## 2024-05-28 MED ORDER — VYVANSE 30 MG PO CAPS
30.0000 mg | ORAL_CAPSULE | Freq: Every morning | ORAL | 0 refills | Status: AC
  Filled 2024-05-28 – 2024-07-26 (×2): qty 30, 30d supply, fill #0

## 2024-05-31 ENCOUNTER — Other Ambulatory Visit (HOSPITAL_COMMUNITY): Payer: Self-pay

## 2024-06-07 ENCOUNTER — Other Ambulatory Visit (HOSPITAL_COMMUNITY): Payer: Self-pay

## 2024-06-07 ENCOUNTER — Other Ambulatory Visit: Payer: Self-pay

## 2024-06-07 NOTE — Progress Notes (Signed)
 Specialty Pharmacy Refill Coordination Note  Spoke with Willette P Rost  Tenille P Redel is a 28 y.o. female contacted today regarding refills of specialty medication(s) Dolutegravir -lamiVUDine  (Dovato )  Doses on hand: 13  Patient requested: Delivery   Delivery date: 06/17/24   Verified address: [address] [place] [address]  Medication will be filled on 06/16/24.

## 2024-06-09 ENCOUNTER — Other Ambulatory Visit (HOSPITAL_COMMUNITY): Payer: Self-pay

## 2024-06-09 MED ORDER — HYDROXYZINE HCL 25 MG PO TABS
12.5000 mg | ORAL_TABLET | Freq: Two times a day (BID) | ORAL | 0 refills | Status: AC | PRN
  Filled 2024-06-09: qty 60, 30d supply, fill #0

## 2024-06-14 ENCOUNTER — Other Ambulatory Visit (HOSPITAL_COMMUNITY): Payer: Self-pay

## 2024-06-16 ENCOUNTER — Other Ambulatory Visit: Payer: Self-pay

## 2024-06-16 ENCOUNTER — Other Ambulatory Visit (HOSPITAL_COMMUNITY): Payer: Self-pay

## 2024-06-22 ENCOUNTER — Other Ambulatory Visit (HOSPITAL_COMMUNITY): Payer: Self-pay

## 2024-06-22 MED ORDER — VALACYCLOVIR HCL 1 G PO TABS
1000.0000 mg | ORAL_TABLET | Freq: Every day | ORAL | 0 refills | Status: AC
  Filled 2024-06-22: qty 21, 21d supply, fill #0

## 2024-06-28 ENCOUNTER — Other Ambulatory Visit (HOSPITAL_COMMUNITY): Payer: Self-pay

## 2024-06-28 MED ORDER — AMPHETAMINE-DEXTROAMPHETAMINE 10 MG PO TABS
10.0000 mg | ORAL_TABLET | Freq: Every day | ORAL | 0 refills | Status: DC
  Filled 2024-06-28: qty 30, 30d supply, fill #0

## 2024-06-28 MED ORDER — QUETIAPINE FUMARATE 100 MG PO TABS
100.0000 mg | ORAL_TABLET | Freq: Every day | ORAL | 0 refills | Status: DC
  Filled 2024-06-28 – 2024-07-19 (×3): qty 90, 90d supply, fill #0

## 2024-06-28 MED ORDER — ESCITALOPRAM OXALATE 20 MG PO TABS
20.0000 mg | ORAL_TABLET | Freq: Every day | ORAL | 0 refills | Status: AC
  Filled 2024-06-28 – 2024-07-29 (×3): qty 90, 90d supply, fill #0
  Filled ????-??-??: fill #0

## 2024-06-28 MED ORDER — HYDROXYZINE HCL 25 MG PO TABS
12.5000 mg | ORAL_TABLET | Freq: Two times a day (BID) | ORAL | 0 refills | Status: AC
  Filled 2024-06-28 – 2024-07-19 (×3): qty 60, 30d supply, fill #0

## 2024-06-28 MED ORDER — VYVANSE 30 MG PO CAPS
30.0000 mg | ORAL_CAPSULE | Freq: Every morning | ORAL | 0 refills | Status: AC
  Filled 2024-06-28: qty 30, 30d supply, fill #0

## 2024-07-07 ENCOUNTER — Other Ambulatory Visit (HOSPITAL_COMMUNITY): Payer: Self-pay

## 2024-07-09 ENCOUNTER — Other Ambulatory Visit: Payer: Self-pay

## 2024-07-13 ENCOUNTER — Other Ambulatory Visit (HOSPITAL_COMMUNITY): Payer: Self-pay

## 2024-07-15 ENCOUNTER — Ambulatory Visit: Admitting: Infectious Diseases

## 2024-07-15 ENCOUNTER — Other Ambulatory Visit (HOSPITAL_COMMUNITY): Payer: Self-pay

## 2024-07-15 ENCOUNTER — Other Ambulatory Visit: Payer: Self-pay

## 2024-07-15 NOTE — Progress Notes (Signed)
 Specialty Pharmacy Refill Coordination Note  Nicole Morgan is a 28 y.o. female contacted today regarding refills of specialty medication(s) Dolutegravir -lamiVUDine  (Dovato )   Patient requested Delivery   Delivery date: 07/19/24   Verified address: [address] [place] [address]   Medication will be filled on 07/16/24.

## 2024-07-15 NOTE — Progress Notes (Signed)
 Clinical Intervention Note  Clinical Intervention Notes: Patient reports starting Vyvanse , Adderall, and hydroxyzine , no DDIs identified with her Dovato .   Clinical Intervention Outcomes: Prevention of an adverse drug event   Silvano LOISE Blair Karel Santa

## 2024-07-19 ENCOUNTER — Other Ambulatory Visit (HOSPITAL_COMMUNITY): Payer: Self-pay

## 2024-07-20 ENCOUNTER — Other Ambulatory Visit: Payer: Self-pay

## 2024-07-22 ENCOUNTER — Other Ambulatory Visit: Payer: Self-pay

## 2024-07-22 ENCOUNTER — Other Ambulatory Visit (HOSPITAL_COMMUNITY): Payer: Self-pay

## 2024-07-23 ENCOUNTER — Other Ambulatory Visit (HOSPITAL_COMMUNITY): Payer: Self-pay

## 2024-07-23 MED ORDER — METHOCARBAMOL 500 MG PO TABS
500.0000 mg | ORAL_TABLET | Freq: Every day | ORAL | 2 refills | Status: AC
  Filled 2024-07-23: qty 1, 1d supply, fill #0
  Filled 2024-08-27: qty 30, 30d supply, fill #0
  Filled 2024-09-24: qty 30, 30d supply, fill #1
  Filled 2024-10-07 – 2024-10-25 (×2): qty 30, 30d supply, fill #2

## 2024-07-23 MED ORDER — NITROFURANTOIN MONOHYD MACRO 100 MG PO CAPS
100.0000 mg | ORAL_CAPSULE | Freq: Two times a day (BID) | ORAL | 0 refills | Status: AC
  Filled 2024-07-23: qty 10, 5d supply, fill #0

## 2024-07-26 ENCOUNTER — Other Ambulatory Visit (HOSPITAL_COMMUNITY): Payer: Self-pay

## 2024-07-26 ENCOUNTER — Other Ambulatory Visit: Payer: Self-pay

## 2024-07-28 ENCOUNTER — Other Ambulatory Visit (HOSPITAL_COMMUNITY): Payer: Self-pay

## 2024-07-29 ENCOUNTER — Other Ambulatory Visit (HOSPITAL_COMMUNITY): Payer: Self-pay

## 2024-07-29 ENCOUNTER — Other Ambulatory Visit: Payer: Self-pay

## 2024-07-29 MED ORDER — AMPHETAMINE-DEXTROAMPHETAMINE 10 MG PO TABS
10.0000 mg | ORAL_TABLET | Freq: Every day | ORAL | 0 refills | Status: AC
  Filled 2024-07-29 – 2024-08-02 (×2): qty 30, 30d supply, fill #0

## 2024-07-30 ENCOUNTER — Other Ambulatory Visit: Payer: Self-pay

## 2024-08-02 ENCOUNTER — Other Ambulatory Visit (HOSPITAL_COMMUNITY): Payer: Self-pay

## 2024-08-03 ENCOUNTER — Other Ambulatory Visit: Payer: Self-pay

## 2024-08-04 ENCOUNTER — Other Ambulatory Visit: Payer: Self-pay

## 2024-08-05 ENCOUNTER — Other Ambulatory Visit: Payer: Self-pay

## 2024-08-05 ENCOUNTER — Other Ambulatory Visit (HOSPITAL_COMMUNITY): Payer: Self-pay

## 2024-08-09 ENCOUNTER — Ambulatory Visit: Admitting: Infectious Diseases

## 2024-08-10 ENCOUNTER — Other Ambulatory Visit (HOSPITAL_COMMUNITY): Payer: Self-pay

## 2024-08-12 ENCOUNTER — Other Ambulatory Visit: Payer: Self-pay

## 2024-08-12 ENCOUNTER — Other Ambulatory Visit (HOSPITAL_COMMUNITY): Payer: Self-pay

## 2024-08-12 MED ORDER — AMPHETAMINE-DEXTROAMPHETAMINE 10 MG PO TABS
10.0000 mg | ORAL_TABLET | Freq: Every day | ORAL | 0 refills | Status: AC
  Filled 2024-08-27 – 2024-08-30 (×5): qty 30, 30d supply, fill #0
  Filled ????-??-??: fill #0

## 2024-08-12 MED ORDER — HYDROXYZINE HCL 25 MG PO TABS
12.5000 mg | ORAL_TABLET | Freq: Two times a day (BID) | ORAL | 0 refills | Status: AC | PRN
  Filled 2024-08-12 – 2024-08-14 (×2): qty 60, 30d supply, fill #0
  Filled 2024-10-07: qty 60, 30d supply, fill #1

## 2024-08-13 ENCOUNTER — Other Ambulatory Visit (HOSPITAL_COMMUNITY): Payer: Self-pay

## 2024-08-13 ENCOUNTER — Other Ambulatory Visit: Payer: Self-pay

## 2024-08-13 MED ORDER — LISDEXAMFETAMINE DIMESYLATE 30 MG PO CAPS
30.0000 mg | ORAL_CAPSULE | Freq: Every morning | ORAL | 0 refills | Status: AC
  Filled 2024-08-24 (×2): qty 30, 30d supply, fill #0
  Filled ????-??-??: fill #0

## 2024-08-14 ENCOUNTER — Other Ambulatory Visit (HOSPITAL_COMMUNITY): Payer: Self-pay

## 2024-08-16 ENCOUNTER — Other Ambulatory Visit (HOSPITAL_COMMUNITY): Payer: Self-pay

## 2024-08-16 ENCOUNTER — Other Ambulatory Visit: Payer: Self-pay

## 2024-08-17 ENCOUNTER — Other Ambulatory Visit (HOSPITAL_COMMUNITY): Payer: Self-pay

## 2024-08-17 ENCOUNTER — Other Ambulatory Visit: Payer: Self-pay

## 2024-08-18 ENCOUNTER — Other Ambulatory Visit: Payer: Self-pay

## 2024-08-21 ENCOUNTER — Encounter (HOSPITAL_BASED_OUTPATIENT_CLINIC_OR_DEPARTMENT_OTHER): Payer: Self-pay

## 2024-08-21 ENCOUNTER — Emergency Department (HOSPITAL_BASED_OUTPATIENT_CLINIC_OR_DEPARTMENT_OTHER)
Admission: EM | Admit: 2024-08-21 | Discharge: 2024-08-21 | Disposition: A | Discharge status: Home / Self Care | Attending: Emergency Medicine | Admitting: Emergency Medicine

## 2024-08-21 ENCOUNTER — Emergency Department (HOSPITAL_BASED_OUTPATIENT_CLINIC_OR_DEPARTMENT_OTHER): Admitting: Radiology

## 2024-08-21 DIAGNOSIS — M79641 Pain in right hand: Secondary | ICD-10-CM | POA: Insufficient documentation

## 2024-08-21 DIAGNOSIS — M7989 Other specified soft tissue disorders: Secondary | ICD-10-CM | POA: Insufficient documentation

## 2024-08-21 DIAGNOSIS — M25531 Pain in right wrist: Secondary | ICD-10-CM | POA: Diagnosis not present

## 2024-08-21 DIAGNOSIS — Z21 Asymptomatic human immunodeficiency virus [HIV] infection status: Secondary | ICD-10-CM | POA: Insufficient documentation

## 2024-08-21 DIAGNOSIS — R2231 Localized swelling, mass and lump, right upper limb: Secondary | ICD-10-CM

## 2024-08-21 NOTE — Discharge Instructions (Signed)
 It was a pleasure taking care of you today. You were seen in the Emergency Department for evaluation of right hand pain and swelling. Your work-up was reassuring. Your x-ray showed no evidence of fracture or dislocation.  I am placing you in a splint for your comfort.  Please do not take ibuprofen  and Celebrex  at the same time as they can cause GI upset.  If you take an NSAID, please take it every 8 hours as needed for pain.  You may also take Tylenol  every 6 hours as needed for pain.  I recommend keeping your hand above your heart to encourage reduction of swelling.  Refer to the attached documentation for further management of your symptoms. Follow up with your PCP if your symptoms continue.  As you do not have a PCP on file, I have given you a referral to 1 above.  You can call them to schedule an appointment at your convenience.  Please return to the ER if you experience chest pain, trouble breathing, intractable nausea/vomiting or any other life threatening illnesses.
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
## 2024-08-21 NOTE — ED Notes (Signed)
 Took ibuprofen  800mg  ~ 4 hours ago.
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
## 2024-08-23 ENCOUNTER — Other Ambulatory Visit (HOSPITAL_COMMUNITY): Payer: Self-pay

## 2024-08-24 ENCOUNTER — Other Ambulatory Visit: Payer: Self-pay

## 2024-08-24 ENCOUNTER — Other Ambulatory Visit (HOSPITAL_COMMUNITY): Payer: Self-pay

## 2024-08-27 ENCOUNTER — Other Ambulatory Visit (HOSPITAL_COMMUNITY): Payer: Self-pay

## 2024-08-27 ENCOUNTER — Other Ambulatory Visit: Payer: Self-pay

## 2024-08-27 ENCOUNTER — Encounter (HOSPITAL_COMMUNITY): Payer: Self-pay

## 2024-08-30 ENCOUNTER — Other Ambulatory Visit: Payer: Self-pay

## 2024-08-30 ENCOUNTER — Other Ambulatory Visit (HOSPITAL_COMMUNITY): Payer: Self-pay

## 2024-09-04 ENCOUNTER — Other Ambulatory Visit (HOSPITAL_COMMUNITY): Payer: Self-pay

## 2024-09-06 ENCOUNTER — Other Ambulatory Visit: Payer: Self-pay

## 2024-09-06 ENCOUNTER — Other Ambulatory Visit (HOSPITAL_COMMUNITY): Payer: Self-pay

## 2024-09-06 NOTE — Progress Notes (Signed)
 Specialty Pharmacy Refill Coordination Note  Spoke with [name]  Nicole Morgan is a 28 y.o. female contacted today regarding refills of specialty medication(s) Dolutegravir -lamiVUDine  (Dovato )  Doses on hand: 0   Patient requested: Pickup at [facility] at [place] date: 09/06/24  Medication will be filled on 09/06/24

## 2024-09-07 ENCOUNTER — Other Ambulatory Visit (HOSPITAL_COMMUNITY): Payer: Self-pay

## 2024-09-08 ENCOUNTER — Other Ambulatory Visit (HOSPITAL_COMMUNITY): Payer: Self-pay

## 2024-09-08 MED ORDER — VALACYCLOVIR HCL 1 G PO TABS
1000.0000 mg | ORAL_TABLET | Freq: Every day | ORAL | 0 refills | Status: AC
  Filled 2024-09-08: qty 21, 21d supply, fill #0

## 2024-09-09 ENCOUNTER — Other Ambulatory Visit (HOSPITAL_COMMUNITY): Payer: Self-pay

## 2024-09-13 ENCOUNTER — Other Ambulatory Visit (HOSPITAL_COMMUNITY): Payer: Self-pay

## 2024-09-13 MED ORDER — AMPHETAMINE-DEXTROAMPHETAMINE 10 MG PO TABS
10.0000 mg | ORAL_TABLET | Freq: Every day | ORAL | 0 refills | Status: AC
  Filled 2024-09-29 (×2): qty 30, 30d supply, fill #0

## 2024-09-13 MED ORDER — AMPHETAMINE-DEXTROAMPHETAMINE 10 MG PO TABS
10.0000 mg | ORAL_TABLET | Freq: Every day | ORAL | 0 refills | Status: AC
  Filled 2024-10-29: qty 30, 30d supply, fill #0

## 2024-09-13 MED ORDER — VYVANSE 30 MG PO CAPS
30.0000 mg | ORAL_CAPSULE | Freq: Every morning | ORAL | 0 refills | Status: AC
  Filled 2024-09-24: qty 30, 30d supply, fill #0

## 2024-09-13 MED ORDER — QUETIAPINE FUMARATE 100 MG PO TABS: 100.0000 mg | ORAL_TABLET | Freq: Every day | ORAL | 0 refills | Status: AC

## 2024-09-13 MED ORDER — LISDEXAMFETAMINE DIMESYLATE 30 MG PO CHEW
30.0000 mg | CHEWABLE_TABLET | Freq: Every morning | ORAL | 0 refills | Status: AC
  Filled 2024-10-25: qty 10, 10d supply, fill #0
  Filled 2024-10-25: qty 20, 20d supply, fill #0

## 2024-09-13 MED ORDER — ESCITALOPRAM OXALATE 20 MG PO TABS
20.0000 mg | ORAL_TABLET | Freq: Every day | ORAL | 0 refills | Status: AC
  Filled 2024-10-07: qty 90, 90d supply, fill #0

## 2024-09-24 ENCOUNTER — Other Ambulatory Visit (HOSPITAL_COMMUNITY): Payer: Self-pay

## 2024-09-27 ENCOUNTER — Other Ambulatory Visit (HOSPITAL_COMMUNITY): Payer: Self-pay

## 2024-09-29 ENCOUNTER — Other Ambulatory Visit: Payer: Self-pay

## 2024-09-29 ENCOUNTER — Other Ambulatory Visit (HOSPITAL_COMMUNITY): Payer: Self-pay

## 2024-09-29 MED ORDER — NEOMYCIN-POLYMYXIN-DEXAMETH 3.5-10000-0.1 OP SUSP
1.0000 [drp] | Freq: Four times a day (QID) | OPHTHALMIC | 0 refills | Status: AC
  Filled 2024-09-29: qty 5, 13d supply, fill #0

## 2024-09-29 MED ORDER — SULFAMETHOXAZOLE-TRIMETHOPRIM 800-160 MG PO TABS
1.0000 | ORAL_TABLET | Freq: Two times a day (BID) | ORAL | 0 refills | Status: AC
  Filled 2024-09-29: qty 14, 7d supply, fill #0

## 2024-09-30 ENCOUNTER — Other Ambulatory Visit: Payer: Self-pay

## 2024-10-04 ENCOUNTER — Other Ambulatory Visit: Payer: Self-pay

## 2024-10-04 ENCOUNTER — Other Ambulatory Visit (HOSPITAL_COMMUNITY): Payer: Self-pay

## 2024-10-04 ENCOUNTER — Other Ambulatory Visit: Payer: Self-pay | Admitting: Pharmacy Technician

## 2024-10-04 NOTE — Progress Notes (Signed)
 Specialty Pharmacy Refill Coordination Note  Nicole Morgan is a 29 y.o. female contacted today regarding refills of specialty medication(s) Dolutegravir -lamiVUDine  (Dovato )   Patient requested [name] at [facility] at [place] date: 10/04/24   Medication will be filled on: 10/04/24

## 2024-10-05 ENCOUNTER — Other Ambulatory Visit: Payer: Self-pay

## 2024-10-05 ENCOUNTER — Emergency Department (HOSPITAL_COMMUNITY)
Admission: EM | Admit: 2024-10-05 | Discharge: 2024-10-05 | Disposition: A | Discharge status: Home / Self Care | Attending: Emergency Medicine | Admitting: Emergency Medicine

## 2024-10-05 ENCOUNTER — Emergency Department (HOSPITAL_COMMUNITY)

## 2024-10-05 ENCOUNTER — Other Ambulatory Visit (HOSPITAL_COMMUNITY): Payer: Self-pay

## 2024-10-05 DIAGNOSIS — Z5321 Procedure and treatment not carried out due to patient leaving prior to being seen by health care provider: Secondary | ICD-10-CM | POA: Diagnosis not present

## 2024-10-05 DIAGNOSIS — R42 Dizziness and giddiness: Secondary | ICD-10-CM | POA: Diagnosis present

## 2024-10-05 DIAGNOSIS — Z21 Asymptomatic human immunodeficiency virus [HIV] infection status: Secondary | ICD-10-CM | POA: Diagnosis not present

## 2024-10-05 DIAGNOSIS — R21 Rash and other nonspecific skin eruption: Secondary | ICD-10-CM | POA: Diagnosis not present

## 2024-10-05 LAB — CBC
HCT: 37.8 % (ref 36.0–46.0)
Hemoglobin: 13 g/dL (ref 12.0–15.0)
MCH: 33.7 pg (ref 26.0–34.0)
MCHC: 34.4 g/dL (ref 30.0–36.0)
MCV: 97.9 fL (ref 80.0–100.0)
Platelets: 313 K/uL (ref 150–400)
RBC: 3.86 MIL/uL — ABNORMAL LOW (ref 3.87–5.11)
RDW: 13.2 % (ref 11.5–15.5)
WBC: 7.5 K/uL (ref 4.0–10.5)
nRBC: 0 % (ref 0.0–0.2)

## 2024-10-05 LAB — COMPREHENSIVE METABOLIC PANEL WITH GFR
ALT: 12 U/L (ref 0–44)
AST: 20 U/L (ref 15–41)
Albumin: 4.8 g/dL (ref 3.5–5.0)
Alkaline Phosphatase: 72 U/L (ref 38–126)
Anion gap: 10 (ref 5–15)
BUN: 6 mg/dL (ref 6–20)
CO2: 28 mmol/L (ref 22–32)
Calcium: 10.1 mg/dL (ref 8.9–10.3)
Chloride: 99 mmol/L (ref 98–111)
Creatinine, Ser: 0.78 mg/dL (ref 0.44–1.00)
GFR, Estimated: >60 mL/min
Glucose, Bld: 97 mg/dL (ref 70–99)
Potassium: 4 mmol/L (ref 3.5–5.1)
Sodium: 137 mmol/L (ref 135–145)
Total Bilirubin: 0.4 mg/dL (ref 0.0–1.2)
Total Protein: 7.6 g/dL (ref 6.5–8.1)

## 2024-10-05 LAB — TROPONIN T, HIGH SENSITIVITY: Troponin T High Sensitivity: <15 ng/L (ref 0–19)

## 2024-10-05 LAB — HCG, SERUM, QUALITATIVE: Preg, Serum: NEGATIVE

## 2024-10-05 NOTE — ED Provider Triage Note (Signed)
 Emergency Medicine Provider Triage Evaluation Note  Nicole Morgan , a 29 y.o. female  was evaluated in triage.  Pt complains of multiple complaints.  She states she has been intermittently dizzy and jittery and heart hurts when exerting herself.  Symptoms have been going on for about 2-1/2 weeks.  H/o HIV.  Review of Systems  Positive: See above Negative: See above  Physical Exam  BP 134/87 (BP Location: Right Arm)   Pulse 88   Temp 98.4 F (36.9 C) (Oral)   Resp 20   LMP 10/05/2024   SpO2 100%  Gen:   Awake, no distress   Resp:  Normal effort  MSK:   Moves extremities without difficulty  Other:    Medical Decision Making  Medically screening exam initiated at 1:21 PM.  Appropriate orders placed.  Danita P Nazaryan was informed that the remainder of the evaluation will be completed by another provider, this initial triage assessment does not replace that evaluation, and the importance of remaining in the ED until their evaluation is complete.  Work up started   Lang Norleen POUR, PA-C 10/05/24 1322

## 2024-10-05 NOTE — ED Triage Notes (Addendum)
 Patient has hx of HIV. Multiple complaints. Patient is alert and oriented x 4. Airway patent, respirations even and unlabored. Skin normal, warm and dry.  Rash for 1 year. I'm septic.

## 2024-10-06 ENCOUNTER — Telehealth: Payer: Self-pay | Admitting: Infectious Diseases

## 2024-10-06 NOTE — Telephone Encounter (Signed)
 Nicole Morgan called to schedule an appt and also requested a refill on ointment to [facility]. Pt is scheduled 1/20 and can be reached at [phone].

## 2024-10-07 ENCOUNTER — Other Ambulatory Visit (HOSPITAL_COMMUNITY): Payer: Self-pay

## 2024-10-07 ENCOUNTER — Other Ambulatory Visit: Payer: Self-pay

## 2024-10-07 MED ORDER — MUPIROCIN 2 % EX OINT
1.0000 | TOPICAL_OINTMENT | Freq: Two times a day (BID) | CUTANEOUS | 1 refills | Status: AC
  Filled 2024-10-07: qty 22, 11d supply, fill #0

## 2024-10-07 NOTE — Addendum Note (Signed)
 Addended by: MELVENIA COREAN SAILOR on: 10/07/2024 02:26 PM   Modules accepted: Orders

## 2024-10-08 ENCOUNTER — Other Ambulatory Visit: Payer: Self-pay

## 2024-10-08 NOTE — Progress Notes (Unsigned)
 NEW REFERRAL TO CPP CLINIC      HPI: Nicole Morgan is a 29 y.o. female who presents to the [facility] pharmacy clinic for HIV follow-up.  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   AMPHETAMINE -DEXTROAMPHETAMINE  (ADDERALL) 10 MG TABLET    Take 1 tablet (10 mg total) by mouth daily in the afternoon for focus and attention.   AMPHETAMINE -DEXTROAMPHETAMINE  (ADDERALL) 10 MG TABLET    Take 1 tablet (10 mg total) by mouth daily in the afternoon for focus and attention.   AMPHETAMINE -DEXTROAMPHETAMINE  (ADDERALL) 10 MG TABLET    Take 1 tablet (10 mg total) by mouth daily in the afternoon for focus and attention.   CELECOXIB  (CELEBREX ) 200 MG CAPSULE    Take 1 capsule (200 mg total) by mouth 2 (two) times daily.   CYCLOBENZAPRINE  (FLEXERIL ) 10 MG TABLET    Take 1 tablet (10 mg total) by mouth 2 (two) times daily as needed for muscle spasms.   DICYCLOMINE  (BENTYL ) 20 MG TABLET    Take 1 tablet (20 mg total) by mouth 2 (two) times daily.   DOLUTEGRAVIR -LAMIVUDINE  (DOVATO ) 50-300 MG TABLET    Take 1 tablet by mouth daily.   ESCITALOPRAM  (LEXAPRO ) 20 MG TABLET    Take 1 tablet (20 mg  total) by mouth daily.   ESCITALOPRAM  (LEXAPRO ) 20 MG TABLET    Take 1 tablet (20 mg total) by mouth daily.   ESCITALOPRAM  (LEXAPRO ) 20 MG TABLET    Take 1 tablet (20 mg total) by mouth daily.   ESCITALOPRAM  (LEXAPRO ) 20 MG TABLET    Take 1 tablet (20 mg total) by mouth daily.   ESCITALOPRAM  (LEXAPRO ) 20 MG TABLET    Take 1 tablet (20 mg total) by mouth daily.   GABAPENTIN  (NEURONTIN ) 300 MG CAPSULE    Take 1 capsule (300 mg total) by mouth 3 (three) times daily.   GABAPENTIN  (NEURONTIN ) 300 MG CAPSULE    Take 2 capsules (600 mg total) by mouth 3 (three) times daily.   HYDROXYZINE  (ATARAX ) 25 MG TABLET    Take 0.5-1 tablets (12.5-25 mg total) by mouth 2 (two) times daily as needed for anxiety   HYDROXYZINE  (ATARAX ) 25 MG TABLET    Take 0.5-1 tablets (12.5-25 mg total) by mouth 2 (two) times daily as needed for anxiety   HYDROXYZINE  (ATARAX ) 25 MG TABLET    Take 0.5-1 tablets (12.5-25 mg total) by mouth up to  2 (two) times daily as needed for anxiety.   LIDOCAINE  (LIDODERM ) 5 %    Place 1 patch onto the skin daily. Remove & Discard patch within 12 hours or as directed by MD   LIDOCAINE  (LIDODERM ) 5 %    Place 1 patch onto the  skin daily.   LISDEXAMFETAMINE  (VYVANSE ) 30 MG CAPSULE    Take 1 capsule (30 mg total) by mouth in the morning.   LISDEXAMFETAMINE  (VYVANSE ) 30 MG CHEWABLE TABLET    Chew 1 tablet (30 mg total) by mouth every morning.   LOPERAMIDE  (IMODIUM ) 2 MG CAPSULE    Take 1 capsule (2 mg total) by mouth 4 (four) times daily as needed for diarrhea or loose stools.   METHOCARBAMOL  (ROBAXIN ) 500 MG TABLET    Take 1 tablet (500 mg total) by mouth 1 hour before bedtime   METHYLPREDNISOLONE  (MEDROL  DOSEPAK) 4 MG TBPK TABLET    Please follow directions on Dosepak   MUPIROCIN  OINTMENT (BACTROBAN ) 2 %    Apply topically 2 (two) times daily.   NAPROXEN  (NAPROSYN ) 500 MG TABLET    Take one tablet (500 mg dose) by mouth 2 (two) times daily with meals.   NEOMYCIN -POLYMYXIN B-DEXAMETHASONE  (MAXITROL )  3.5-10000-0.1 SUSP    Place 1 drop into both eyes 4 (four) times daily.   NITROFURANTOIN , MACROCRYSTAL-MONOHYDRATE, (MACROBID ) 100 MG CAPSULE    Take 1 capsule (100 mg total) by mouth 2 (two) times daily for 5 days   QUETIAPINE  (SEROQUEL ) 100 MG TABLET    Take 1 tablet (100 mg total) by mouth at bedtime.   QUETIAPINE  (SEROQUEL ) 50 MG TABLET    Take 1 tablet (50 mg total) by mouth at bedtime.   QUETIAPINE  (SEROQUEL ) 50 MG TABLET    Take 1-2 tablets (50-100 mg total) by mouth at bedtime.   VALACYCLOVIR  (VALTREX ) 1000 MG TABLET    Take 1 tablet (1,000 mg total) by mouth daily.   VALACYCLOVIR  (VALTREX ) 1000 MG TABLET    Take 1 tablet (1,000 mg total) by mouth daily.   VALACYCLOVIR  (VALTREX ) 1000 MG TABLET    Take 1 tablet (1,000 mg total) by mouth daily.   VALACYCLOVIR  (VALTREX ) 1000 MG TABLET    Take 1 tablet (1,000 mg total) by mouth daily.   VYVANSE  30 MG CAPSULE    Take 1 capsule (30 mg total) by mouth every morning.   VYVANSE  30 MG CAPSULE    Take 1 capsule (30 mg total) by mouth in the morning.   VYVANSE  30 MG CAPSULE    Take 1 capsule (30 mg total) by mouth every morning. Dispense as Written.  Modified Medications   No medications on file  Discontinued Medications   No medications on file    Allergies: Allergies[1]  Past Medical History: Past Medical History:  Diagnosis Date   Anxiety 2020   Bipolar 1 disorder (HCC) 2020   Cystic fibrosis carrier    Depression    no meds   Eczema    HIV (human immunodeficiency virus infection) (HCC) 04/2022   Incompetent cervix    Insomnia 2020   PTSD (post-traumatic stress disorder) 2020   Substance abuse (HCC)    alcohol, cocaine, marijuana   UTI (urinary tract infection)     Social History: Social History   Socioeconomic History   Marital status: Single    Spouse name: Not on file   Number of children: 1   Years of education: Not on file   Highest education level: Some college, no degree  Occupational History    Employer: [employer]  Tobacco Use   Smoking status: Every Day    Types: E-cigarettes    Last attempt to quit: 04/2022    Years since quitting: 2.4    Passive exposure: Current   Smokeless tobacco: Never  Vaping Use   Vaping status: Every Day   Substances: Nicotine   Substance and Sexual Activity   Alcohol use: Not Currently   Drug use: Not Currently    Frequency: 1.0 times per week    Types: Marijuana    Comment: last day 09/26/22-   Sexual activity: Not Currently  Other Topics Concern   Not on file  Social History Narrative   Lives with child    Right handed   Caffeine: about 3 cups/day maybe more   Social Drivers of Health   Tobacco Use: Low Risk (09/29/2024)   Received from [facility]   Patient History    Smoking Tobacco Use: Never    Smokeless Tobacco Use: Never    Passive Exposure: Not on file  Recent Concern: Tobacco Use - High Risk (08/21/2024)   Patient History    Smoking Tobacco Use: Every Day    Smokeless Tobacco Use: Never    Passive Exposure: Current  Financial Resource Strain: Low Risk (09/29/2024)   Received from [facility]   Overall Financial Resource Strain (CARDIA)    How hard is it for you to pay for the very basics like food, housing, medical care, and heating?: Not hard at all  Food Insecurity: No Food Insecurity (09/29/2024)   Received from [facility]   Epic    Within the past 12 months, you worried that your food would run out before you got the money to buy more.: Never true    Within the past 12 months, the food you bought just didn't last and you didn't have money to get more.: Never true  Transportation Needs: No Transportation Needs (09/29/2024)   Received from [facility]    In the past 12 months, has lack of transportation kept you from medical appointments or from getting medications?: No    In the past 12 months, has lack of transportation kept you from meetings, work, or from getting things needed for daily living?: No  Physical  Activity: Sufficiently Active (04/01/2024)   Received from [facility]   Exercise Vital Sign    On average, how many days per week do you engage in moderate to strenuous exercise (like a brisk walk)?: 5 days    On average, how many minutes do you engage in exercise at this level?: 150+ min  Stress: Stress Concern Present (04/01/2024)   Received from [facility] - Occupational Stress Questionnaire    Feeling of Stress : To some extent  Social Connections: Socially Integrated (04/01/2024)   Received from [facility]   Social Network    How would you rate your social network (family, work, friends)?: Good participation with social networks  Depression (PHQ2-9): Low Risk (01/21/2024)   Depression (PHQ2-9)    PHQ-2 Score: 0  Alcohol Screen: Low Risk (05/16/2022)   Alcohol Screen    Last Alcohol Screening Score (AUDIT): 0  Housing: Low Risk (09/29/2024)   Received from [facility]    In the last 12 months, was there a time when you were not able to pay the mortgage or rent on time?: No    In the past 12 months, how many times have you moved where you were living?: 1    At any time in the past 12 months, were you homeless or living in a shelter (including now)?: No  Utilities: Not At Risk (09/29/2024)   Received from [facility]  In the past 12 months has the electric, gas, oil, or water company threatened to shut off services in your home?: No  Health Literacy: Not on file    Labs: Lab Results  Component Value Date   HIV1RNAQUANT NOT DETECTED 01/21/2024   HIV1RNAQUANT Not Detected 10/09/2023   HIV1RNAQUANT Not Detected 05/19/2023   CD4TABS 890 05/19/2023   CD4TABS 1,148 11/04/2022   CD4TABS 1,287 05/14/2022    RPR and STI Lab Results  Component Value Date   LABRPR NON-REACTIVE 01/21/2024   LABRPR NON REACTIVE 07/24/2022   LABRPR Non Reactive 05/17/2022   LABRPR Non Reactive 05/14/2022   LABRPR Nonreactive  03/20/2022    Hepatitis B Lab Results  Component Value Date   HEPBSAB NON-REACTIVE 05/14/2022   HEPBSAG Negative 03/20/2022   Hepatitis C No results found for: HEPCAB, HCVRNAPCRQN Hepatitis A Lab Results  Component Value Date   HAV NON-REACTIVE 07/15/2022   Lipids: Lab Results  Component Value Date   CHOL 148 05/22/2023   TRIG 80 05/22/2023   HDL 62 05/22/2023   CHOLHDL 2.4 05/22/2023   LDLCALC 71 05/22/2023    Current HIV Regimen: Dovato   Assessment: Nicole Morgan presents to clinic today for HIV follow-up as a request because her numbers did not look good; ***. Last followed with Corean in April 2025. Has been taking Dovato  daily without any missed doses; last filled with *** on ***. Will check viral load, lipid panel, and CD4 count today. Due for follow up with Corean in February.   Eligible for Menveo, Shingles, COVID, HAV, HBV, and HPV vaccines; ***.   Plan: - Refill Dovato  - Check HIV RNA, lipid panel, and CD4 count - Follow up with Corean on 2/5  Alan Geralds, PharmD, CPP, BCIDP, AAHIVP Clinical Pharmacist Practitioner Infectious Diseases Clinical Pharmacist [facility] 10/08/2024, 10:37 AM        [1] No Known Allergies

## 2024-10-09 ENCOUNTER — Other Ambulatory Visit (HOSPITAL_COMMUNITY): Payer: Self-pay

## 2024-10-09 MED ORDER — GABAPENTIN 300 MG PO CAPS
600.0000 mg | ORAL_CAPSULE | Freq: Three times a day (TID) | ORAL | 3 refills | Status: AC
  Filled 2024-10-09: qty 180, 30d supply, fill #0

## 2024-10-09 MED ORDER — VALACYCLOVIR HCL 1 G PO TABS: 1000.0000 mg | ORAL_TABLET | Freq: Every day | ORAL | 0 refills | Status: AC

## 2024-10-10 ENCOUNTER — Other Ambulatory Visit: Payer: Self-pay

## 2024-10-11 ENCOUNTER — Other Ambulatory Visit (HOSPITAL_COMMUNITY): Payer: Self-pay

## 2024-10-12 ENCOUNTER — Other Ambulatory Visit: Payer: Self-pay

## 2024-10-12 ENCOUNTER — Other Ambulatory Visit (HOSPITAL_COMMUNITY): Payer: Self-pay

## 2024-10-12 ENCOUNTER — Ambulatory Visit (INDEPENDENT_AMBULATORY_CARE_PROVIDER_SITE_OTHER): Admitting: Pharmacist

## 2024-10-12 DIAGNOSIS — Z21 Asymptomatic human immunodeficiency virus [HIV] infection status: Secondary | ICD-10-CM | POA: Diagnosis present

## 2024-10-12 DIAGNOSIS — Z79899 Other long term (current) drug therapy: Secondary | ICD-10-CM

## 2024-10-12 MED ORDER — MUPIROCIN 2 % EX OINT
1.0000 | TOPICAL_OINTMENT | Freq: Two times a day (BID) | CUTANEOUS | 0 refills | Status: AC
  Filled 2024-10-12: qty 22, 11d supply, fill #0

## 2024-10-13 LAB — T-HELPER CELLS (CD4) COUNT (NOT AT ARMC)
CD4 % Helper T Cell: 58 % (ref 33–65)
CD4 T Cell Abs: 1309 /uL (ref 400–1790)

## 2024-10-14 LAB — HIV-1 RNA QUANT-NO REFLEX-BLD
HIV 1 RNA Quant: NOT DETECTED {copies}/mL
HIV-1 RNA Quant, Log: NOT DETECTED {Log_copies}/mL

## 2024-10-14 LAB — LIPID PANEL
Cholesterol: 136 mg/dL
HDL: 49 mg/dL — ABNORMAL LOW
LDL Cholesterol (Calc): 68 mg/dL
Non-HDL Cholesterol (Calc): 87 mg/dL
Total CHOL/HDL Ratio: 2.8 (calc)
Triglycerides: 108 mg/dL

## 2024-10-19 ENCOUNTER — Other Ambulatory Visit: Payer: Self-pay

## 2024-10-19 ENCOUNTER — Emergency Department (HOSPITAL_COMMUNITY)
Admission: EM | Admit: 2024-10-19 | Discharge: 2024-10-19 | Disposition: A | Discharge status: Home / Self Care | Attending: Emergency Medicine | Admitting: Emergency Medicine

## 2024-10-19 ENCOUNTER — Emergency Department (HOSPITAL_COMMUNITY)

## 2024-10-19 DIAGNOSIS — Z21 Asymptomatic human immunodeficiency virus [HIV] infection status: Secondary | ICD-10-CM | POA: Insufficient documentation

## 2024-10-19 DIAGNOSIS — F424 Excoriation (skin-picking) disorder: Secondary | ICD-10-CM | POA: Diagnosis present

## 2024-10-19 DIAGNOSIS — R519 Headache, unspecified: Secondary | ICD-10-CM | POA: Insufficient documentation

## 2024-10-19 LAB — CBC WITH DIFFERENTIAL/PLATELET
Abs Immature Granulocytes: 0.02 10*3/uL (ref 0.00–0.07)
Basophils Absolute: 0.1 10*3/uL (ref 0.0–0.1)
Basophils Relative: 1 %
Eosinophils Absolute: 0.2 10*3/uL (ref 0.0–0.5)
Eosinophils Relative: 2 %
HCT: 37.9 % (ref 36.0–46.0)
Hemoglobin: 12.1 g/dL (ref 12.0–15.0)
Immature Granulocytes: 0 %
Lymphocytes Relative: 35 %
Lymphs Abs: 3.5 10*3/uL (ref 0.7–4.0)
MCH: 32.7 pg (ref 26.0–34.0)
MCHC: 31.9 g/dL (ref 30.0–36.0)
MCV: 102.4 fL — ABNORMAL HIGH (ref 80.0–100.0)
Monocytes Absolute: 0.7 10*3/uL (ref 0.1–1.0)
Monocytes Relative: 7 %
Neutro Abs: 5.7 10*3/uL (ref 1.7–7.7)
Neutrophils Relative %: 55 %
Platelets: 293 10*3/uL (ref 150–400)
RBC: 3.7 MIL/uL — ABNORMAL LOW (ref 3.87–5.11)
RDW: 12.8 % (ref 11.5–15.5)
WBC: 10.2 10*3/uL (ref 4.0–10.5)
nRBC: 0 % (ref 0.0–0.2)

## 2024-10-19 LAB — COMPREHENSIVE METABOLIC PANEL WITH GFR
ALT: 8 U/L (ref 0–44)
AST: 18 U/L (ref 15–41)
Albumin: 4.1 g/dL (ref 3.5–5.0)
Alkaline Phosphatase: 66 U/L (ref 38–126)
Anion gap: 7 (ref 5–15)
BUN: 5 mg/dL — ABNORMAL LOW (ref 6–20)
CO2: 31 mmol/L (ref 22–32)
Calcium: 9 mg/dL (ref 8.9–10.3)
Chloride: 102 mmol/L (ref 98–111)
Creatinine, Ser: 0.66 mg/dL (ref 0.44–1.00)
GFR, Estimated: >60 mL/min
Glucose, Bld: 96 mg/dL (ref 70–99)
Potassium: 4.1 mmol/L (ref 3.5–5.1)
Sodium: 140 mmol/L (ref 135–145)
Total Bilirubin: 0.2 mg/dL (ref 0.0–1.2)
Total Protein: 6.5 g/dL (ref 6.5–8.1)

## 2024-10-19 LAB — HCG, SERUM, QUALITATIVE: Preg, Serum: NEGATIVE

## 2024-10-19 LAB — I-STAT CG4 LACTIC ACID, ED: Lactic Acid, Venous: 1 mmol/L (ref 0.5–1.9)

## 2024-10-19 MED ORDER — IOHEXOL 300 MG/ML  SOLN
75.0000 mL | Freq: Once | INTRAMUSCULAR | Status: AC | PRN
  Administered 2024-10-19: 75 mL via INTRAVENOUS

## 2024-10-19 MED ORDER — KETOROLAC TROMETHAMINE 15 MG/ML IJ SOLN
15.0000 mg | Freq: Once | INTRAMUSCULAR | Status: AC
  Administered 2024-10-19: 15 mg via INTRAVENOUS
  Filled 2024-10-19: qty 1

## 2024-10-19 MED ORDER — PROCHLORPERAZINE EDISYLATE 10 MG/2ML IJ SOLN
10.0000 mg | Freq: Once | INTRAMUSCULAR | Status: AC
  Administered 2024-10-19: 10 mg via INTRAVENOUS
  Filled 2024-10-19: qty 2

## 2024-10-19 MED ORDER — ACETAMINOPHEN 500 MG PO TABS
1000.0000 mg | ORAL_TABLET | Freq: Once | ORAL | Status: AC
  Administered 2024-10-19: 1000 mg via ORAL
  Filled 2024-10-19: qty 2

## 2024-10-19 NOTE — ED Provider Notes (Signed)
 "  EMERGENCY DEPARTMENT AT [facility] Provider Note   CSN: [id number] Arrival date & time: 10/19/24  1319     History Chief Complaint  Patient presents with   Wound Check    HPI: Nicole Morgan is a 29 y.o. female with history pertinent HIV on antiretroviral therapy, GAD, polysubstance use disorder, borderline personality disorder who presents complaining of concerns for skin infection. Patient arrived via EMS.  History provided by patient.  No interpreter required during this encounter.  Patient reports that that she has had recurrent lesions on her face, as well as her left lateral hip that she is concerned for infection.  Reports that she has had prior rounds of antibiotics, and has had improvement, however she will develop new lesions.  Reports that her significant other also developed similar lesions.  Reports that she has a history of HIV, however reports that she is undetectable.  Reports that she had a lesion on her left hip that is causing pain, that she picks at.  Reports that she does pick at her skin on her face as well.  Reports that she has a history of anxiety, and this worsens her skin picking reports that she also has a headache, and she is concerned for possible infection of the brain, however denies any fevers, chills, difficulty moving or feeling any part of her body, chest pain, shortness of breath.  Patient's recorded medical, surgical, social, medication list and allergies were reviewed in the Snapshot window as part of the initial history.   Prior to Admission medications  Medication Sig Start Date End Date Taking? Authorizing Provider  amphetamine -dextroamphetamine  (ADDERALL) 10 MG tablet Take 1 tablet (10 mg total) by mouth daily in the afternoon for focus and attention. 07/29/24     amphetamine -dextroamphetamine  (ADDERALL) 10 MG tablet Take 1 tablet (10 mg total) by mouth daily in the afternoon for focus and attention. 08/27/24      amphetamine -dextroamphetamine  (ADDERALL) 10 MG tablet Take 1 tablet (10 mg total) by mouth daily in the afternoon for focus and attention. 10/29/24     amphetamine -dextroamphetamine  (ADDERALL) 10 MG tablet Take 1 tablet (10 mg total) by mouth daily in the afternoon for focus and attention. 09/29/24     celecoxib  (CELEBREX ) 200 MG capsule Take 1 capsule (200 mg total) by mouth 2 (two) times daily. 03/22/24     cyclobenzaprine  (FLEXERIL ) 10 MG tablet Take 1 tablet (10 mg total) by mouth 2 (two) times daily as needed for muscle spasms. 01/27/24   Tegeler, Lonni PARAS, MD  dicyclomine  (BENTYL ) 20 MG tablet Take 1 tablet (20 mg total) by mouth 2 (two) times daily. 04/21/24   Curatolo, Adam, DO  dolutegravir -lamiVUDine  (DOVATO ) 50-300 MG tablet Take 1 tablet by mouth daily. 10/09/23   Melvenia Corean SAILOR, NP  escitalopram  (LEXAPRO ) 20 MG tablet Take 1 tablet (20 mg total) by mouth daily. 02/20/24     escitalopram  (LEXAPRO ) 20 MG tablet Take 1 tablet (20 mg total) by mouth daily. 03/09/24     escitalopram  (LEXAPRO ) 20 MG tablet Take 1 tablet (20 mg total) by mouth daily. 05/06/24     escitalopram  (LEXAPRO ) 20 MG tablet Take 1 tablet (20 mg total) by mouth daily. 06/28/24     escitalopram  (LEXAPRO ) 20 MG tablet Take 1 tablet (20 mg total) by mouth daily. 09/13/24     gabapentin  (NEURONTIN ) 300 MG capsule Take 1 capsule (300 mg total) by mouth 3 (three) times daily. 01/27/24   Tegeler, Lonni PARAS, MD  gabapentin  (NEURONTIN ) 300 MG capsule Take 2 capsules (600 mg total) by mouth 3 (three) times daily. 10/08/24     hydrOXYzine  (ATARAX ) 25 MG tablet Take 0.5-1 tablets (12.5-25 mg total) by mouth 2 (two) times daily as needed for anxiety 06/09/24     hydrOXYzine  (ATARAX ) 25 MG tablet Take 0.5-1 tablets (12.5-25 mg total) by mouth 2 (two) times daily as needed for anxiety 06/28/24     hydrOXYzine  (ATARAX ) 25 MG tablet Take 0.5-1 tablets (12.5-25 mg total) by mouth up to  2 (two) times daily as needed for anxiety. 08/12/24      lidocaine  (LIDODERM ) 5 % Place 1 patch onto the skin daily. Remove & Discard patch within 12 hours or as directed by MD 01/27/24   Tegeler, Lonni PARAS, MD  lidocaine  (LIDODERM ) 5 % Place 1 patch onto the skin daily. 02/13/24     lisdexamfetamine  (VYVANSE ) 30 MG capsule Take 1 capsule (30 mg total) by mouth in the morning. 08/24/24     lisdexamfetamine  (VYVANSE ) 30 MG chewable tablet Chew 1 tablet (30 mg total) by mouth every morning. 10/24/24     loperamide  (IMODIUM ) 2 MG capsule Take 1 capsule (2 mg total) by mouth 4 (four) times daily as needed for diarrhea or loose stools. 04/21/24   Curatolo, Adam, DO  methocarbamol  (ROBAXIN ) 500 MG tablet Take 1 tablet (500 mg total) by mouth 1 hour before bedtime 07/23/24     methylPREDNISolone  (MEDROL  DOSEPAK) 4 MG TBPK tablet Please follow directions on Dosepak 01/27/24   Tegeler, Lonni PARAS, MD  mupirocin  ointment (BACTROBAN ) 2 % Apply topically 2 (two) times daily. 10/07/24   Melvenia Corean SAILOR, NP  mupirocin  ointment (BACTROBAN ) 2 % Apply topically 2 (two) times daily. 10/12/24   Waddell Alan PARAS, RPH-CPP  naproxen  (NAPROSYN ) 500 MG tablet Take one tablet (500 mg dose) by mouth 2 (two) times daily with meals. 11/17/23     neomycin -polymyxin b-dexamethasone  (MAXITROL ) 3.5-10000-0.1 SUSP Place 1 drop into both eyes 4 (four) times daily. 09/29/24     nitrofurantoin , macrocrystal-monohydrate, (MACROBID ) 100 MG capsule Take 1 capsule (100 mg total) by mouth 2 (two) times daily for 5 days 07/23/24     QUEtiapine  (SEROQUEL ) 100 MG tablet Take 1 tablet (100 mg total) by mouth at bedtime. 09/13/24     QUEtiapine  (SEROQUEL ) 50 MG tablet Take 1 tablet (50 mg total) by mouth at bedtime. 02/20/24     QUEtiapine  (SEROQUEL ) 50 MG tablet Take 1-2 tablets (50-100 mg total) by mouth at bedtime. 04/01/24     valACYclovir  (VALTREX ) 1000 MG tablet Take 1 tablet (1,000 mg total) by mouth daily. 03/24/24     valACYclovir  (VALTREX ) 1000 MG tablet Take 1 tablet (1,000 mg total) by mouth  daily. 06/22/24     valACYclovir  (VALTREX ) 1000 MG tablet Take 1 tablet (1,000 mg total) by mouth daily. 09/08/24     valACYclovir  (VALTREX ) 1000 MG tablet Take 1 tablet (1,000 mg total) by mouth daily. 10/08/24     VYVANSE  30 MG capsule Take 1 capsule (30 mg total) by mouth every morning. 05/28/24     VYVANSE  30 MG capsule Take 1 capsule (30 mg total) by mouth in the morning. 06/28/24     VYVANSE  30 MG capsule Take 1 capsule (30 mg total) by mouth every morning. Dispense as Written. 09/24/24        Allergies: Patient has no known allergies.   Review of Systems   ROS as per HPI  Physical Exam Updated Vital Signs BP 100/76 (  BP Location: Right Arm)   Pulse 90   Temp 98.3 F (36.8 C) (Oral)   Resp 16   Ht 5' 1 (1.549 m)   Wt 49.9 kg   LMP 10/05/2024   SpO2 100%   BMI 20.78 kg/m  Physical Exam Vitals and nursing note reviewed.  Constitutional:      General: She is not in acute distress.    Appearance: She is well-developed.  HENT:     Head: Normocephalic and atraumatic.  Eyes:     Conjunctiva/sclera: Conjunctivae normal.  Cardiovascular:     Rate and Rhythm: Normal rate and regular rhythm.     Heart sounds: No murmur heard. Pulmonary:     Effort: Pulmonary effort is normal. No respiratory distress.     Breath sounds: Normal breath sounds.  Abdominal:     Palpations: Abdomen is soft.  Musculoskeletal:        General: No swelling.     Cervical back: Neck supple.  Skin:    General: Skin is warm and dry.     Capillary Refill: Capillary refill takes less than 2 seconds.     Comments: Multiple shiny pink healing skin lesions, 1 on left lateral hip (see image) and numerous on face, without associated swelling, induration, warmth, tenderness  Neurological:     Mental Status: She is alert.     Gait: Gait normal.     ED Course/ Medical Decision Making/ A&P    Procedures Procedures   Medications Ordered in ED Medications  iohexol  (OMNIPAQUE ) 300 MG/ML solution 75 mL (75 mLs  Intravenous Contrast Given 10/19/24 1928)  prochlorperazine  (COMPAZINE ) injection 10 mg (10 mg Intravenous Given 10/19/24 2012)  acetaminophen  (TYLENOL ) tablet 1,000 mg (1,000 mg Oral Given 10/19/24 2013)  ketorolac  (TORADOL ) 15 MG/ML injection 15 mg (15 mg Intravenous Given 10/19/24 2012)    Medical Decision Making:   Artis P Lacey is a 29 y.o. female who presents for headache, concerns for skin lesions as per above.  Physical exam is pertinent for Multiple shiny pink healing skin lesions, 1 on left lateral hip and numerous on face, without associated swelling, induration, warmth, tenderness.   The differential includes but is not limited to [facility], intracranial infection, cellulitis, abscess, skin picking disorder.  Independent historian: None  External data reviewed: Labs: reviewed prior labs for baseline  Initial Plan:  Screening labs including CBC and Metabolic panel to evaluate for infectious or metabolic etiology of disease.  Screening lactic for sepsis Screening hCG given patient's age CT head with and without contrast to evaluate for structural/infectious intra-cranial pathology.  X-ray of the left hip to evaluate for structural/infectious bony pathology.  Objective evaluation as below reviewed   Labs: Ordered, Independent interpretation, and Details: Lactic acid WNL.  CBC without leukocytosis, anemia, thrombocytopenia.  CMP without AKI, emergent electro gentian, emergent LFT abnormality, serum hCG negative.  Radiology: Ordered, Independent interpretation, Details: CT head without ICH, displaced fracture, hypoenhancing lesions, MLS, loss of gray/white matter differentiation.  X-ray of hip without fracture, dislocation, significant effusion, and All images reviewed independently.  Agree with radiology report at this time.   CT Head W or Wo Contrast Result Date: 10/19/2024 CLINICAL DATA:  Initial evaluation for acute headache. EXAM: CT HEAD WITHOUT AND WITH CONTRAST TECHNIQUE:  Contiguous axial images were obtained from the base of the skull through the vertex without and with intravenous contrast. RADIATION DOSE REDUCTION: This exam was performed according to the departmental dose-optimization program which includes automated exposure control, adjustment of the  mA and/or kV according to patient size and/or use of iterative reconstruction technique. CONTRAST:  75mL OMNIPAQUE  IOHEXOL  300 MG/ML  SOLN COMPARISON:  Prior CT from 04/01/2022. FINDINGS: Brain: Cerebral volume within normal limits for patient age. No acute intracranial hemorrhage. No acute large vessel territory infarct. No mass lesion, midline shift, or mass effect. Ventricles are normal in size without hydrocephalus. No extra-axial fluid collection. Vascular: No abnormal hyperdense vessel seen prior to contrast administration. Normal intravascular enhancement seen throughout the major intracranial vascular structures following the administration of IV contrast. No abnormal or pathologic enhancement within the brain itself. Skull: Scalp soft tissues demonstrate no acute abnormality. Calvarium intact. Sinuses/Orbits: Globes and orbital soft tissues within normal limits. Visualized paranasal sinuses are largely clear. No significant mastoid effusion. Other: None. IMPRESSION: Normal head CT with and without contrast. No acute intracranial abnormality. Electronically Signed   By: Morene Hoard M.D.   On: 10/19/2024 20:24   DG Hip Unilat W or Wo Pelvis 2-3 Views Left Result Date: 10/19/2024 EXAM: 2 OR 3 VIEW(S) XRAY OF THE UNILATERAL HIP 10/19/2024 06:47:00 PM COMPARISON: None available. CLINICAL HISTORY: Hip pain. FINDINGS: BONES AND JOINTS: No acute fracture. No malalignment. SOFT TISSUES: Unremarkable. IMPRESSION: 1. No acute findings. Electronically signed by: Elsie Gravely MD 10/19/2024 07:20 PM EST RP Workstation: [id number]   DG Chest Port 1 View Result Date: 10/05/2024 CLINICAL DATA:  Chest pain, dizziness,  palpitations EXAM: PORTABLE CHEST 1 VIEW COMPARISON:  04/07/2021 FINDINGS: The heart size and mediastinal contours are within normal limits. Both lungs are clear. The visualized skeletal structures are unremarkable. Overlying breast implants noted. Nipple piercings present bilaterally. IMPRESSION: No active disease. Electronically Signed   By: CHRISTELLA.  Shick M.D.   On: 10/05/2024 13:42    EKG/Medicine tests: Not indicated EKG Interpretation:    Interventions: Migraine cocktail with Compazine , Toradol , Tylenol   See the EMR for full details regarding lab and imaging results.  Patient presents to the emergency department for several concerns, states that she has had off-and-on skin lesions for months that have been recurrent despite antibiotics, otherwise it does admit to picking at these lesions.  Patient also reports that she had a lesion on her left lateral hip, and expresses significant concern that this could have extended to a joint, though notably patient does not have any difficulty with ambulation, range of motion of left hip.  Patient also expresses concern that she has headache, and while this is similar to prior headaches, expresses concern that she has a intracranial infection given her history of HIV, though does report that she has been undetectable on her most recent viral load and reports that she has been adherent to her therapy.  On exam, patient does have multiple shiny hyperpigmented lesions, however does not have warmth, tenderness, induration, clinical appearance is most consistent with new skin/healing skin lesions rather than active cellulitis, there is no fluctuance associated concerning for abscess, however given patient's complicated past medical history, do not feel that it is unreasonable to obtain labs and imaging.  Will obtain CT head with and without contrast to better evaluate for intracranial lesion such as lymphoma, toxoplasmosis, other mass lesions etc. given patient does have  a history of HIV.  Fortunately this was negative, x-ray of the hip also reassuring, and absence of any joint pain, gait abnormality, effusion or osseous erosions on x-ray, do not feel the patient requires further imaging of the hip at this time.  Labs do not reveal elevation of lactic acid nor leukocytosis, therefore  feel that severe infection is unlikely.  Given clinical appearance of lesions is more consistent with healing skin wounds rather than ongoing cellulitis, feel that additional round of antibiotics would be of little benefit to patient, however do feel that patient warrants follow-up with dermatology.  On reevaluation, patient continued to be well-appearing, hemodynamically stable, reports resolution of headache after multimodal therapy.  Comfortable with plan for discharge with outpatient follow-up with dermatology.  Presentation is most consistent with acute complicated illness, Current presentation is complicated by underlying chronic conditions, and I did consider and rule out acute life/limb-threatening illness  Discussion of management or test interpretations with external provider(s): Not indicated  Risk Drugs:OTC drugs and Prescription drug management  Disposition: DISCHARGE: I believe that the patient is safe for discharge home with outpatient follow-up. Patient was informed of all pertinent physical exam, laboratory, and imaging findings. Patient's suspected etiology of their symptom presentation was discussed with the patient and all questions were answered. We discussed following up with PCP, dermatologist. I provided thorough ED return precautions. The patient feels safe and comfortable with this plan.  MDM generated using voice dictation software and may contain dictation errors.  Please contact me for any clarification or with any questions.  Clinical Impression:  1. Skin-picking disorder   2. Acute nonintractable headache, unspecified headache type      Discharge    Final Clinical Impression(s) / ED Diagnoses Final diagnoses:  Skin-picking disorder  Acute nonintractable headache, unspecified headache type    Rx / DC Orders ED Discharge Orders          Ordered    Ambulatory referral to Dermatology        10/19/24 2218             Rogelia Jerilynn RAMAN, MD 10/20/24 1434  "

## 2024-10-19 NOTE — ED Triage Notes (Addendum)
 Pt BIBA from home for possible skin infection x month. Lesions on face. States they feel hot to the touch, has had 2 rounds of abx. Significant other is being seen for similar at . Pt reports feeling confused over the last few days. Pt in remission for HIV. Reports more constant left hip pain, has a lesion on that spot  108/66 HR 101 100% RA

## 2024-10-19 NOTE — ED Notes (Signed)
 Pt yelling that she wants the iv out right now and that nobody is helping her. Iv removed.

## 2024-10-19 NOTE — Discharge Instructions (Signed)
 Walterine P Meraz  Thank you for allowing us  to take care of you today.  You came to the Emergency Department today because you have had multiple lesions on your face as well as your left hip, and you are concerned for infection, you do have some skin peeling skin right looks like you previously had small areas of infection, however there are no areas that look like active cellulitis or abscess that require antibiotics at this time.  We did get a set of labs, and you do not have elevation of your white blood cell count or acid level concerning for infection.  We did a CT of your head which does not demonstrate signs of infection or other abnormality.  Your headache improved after getting treatment for headache.  Everyone has bacteria on their skin and under their nails, if you pick at your skin, this can disrupt the skin as barrier and increase the likelihood of infection.  We recommend trying to not pick at your skin.  Additionally you can wash daily with antibacterial soap such as the yellow bar Dial soap, which will help decolonize bacteria on your skin.  We will also give you referral to follow-up with dermatology.   To-Do: 1. Please follow-up with your primary doctor within 1 - 2 weeks / as soon as possible.   Please return to the Emergency Department or call 911 if you experience have worsening of your symptoms, or do not get better, chest pain, shortness of breath, severe or significantly worsening pain, high fever, severe confusion, pass out or have any reason to think that you need emergency medical care.   We hope you feel better soon.   Department of Emergency Medicine [facility] [place]

## 2024-10-22 ENCOUNTER — Other Ambulatory Visit (HOSPITAL_COMMUNITY): Payer: Self-pay

## 2024-10-22 MED ORDER — MUPIROCIN 2 % EX OINT
TOPICAL_OINTMENT | CUTANEOUS | 5 refills | Status: AC
  Filled 2024-10-22: qty 22, 7d supply, fill #0

## 2024-10-22 MED ORDER — SULFAMETHOXAZOLE-TRIMETHOPRIM 800-160 MG PO TABS
ORAL_TABLET | ORAL | 0 refills | Status: AC
  Filled 2024-10-22: qty 28, 14d supply, fill #0

## 2024-10-25 ENCOUNTER — Other Ambulatory Visit (HOSPITAL_COMMUNITY): Payer: Self-pay

## 2024-10-25 ENCOUNTER — Other Ambulatory Visit: Payer: Self-pay

## 2024-10-27 ENCOUNTER — Other Ambulatory Visit (HOSPITAL_COMMUNITY): Payer: Self-pay

## 2024-10-28 ENCOUNTER — Ambulatory Visit: Admitting: Infectious Diseases

## 2024-10-28 ENCOUNTER — Other Ambulatory Visit: Payer: Self-pay

## 2024-10-28 ENCOUNTER — Other Ambulatory Visit: Payer: Self-pay | Admitting: Infectious Diseases

## 2024-10-28 ENCOUNTER — Other Ambulatory Visit (HOSPITAL_COMMUNITY): Payer: Self-pay

## 2024-10-28 DIAGNOSIS — Z21 Asymptomatic human immunodeficiency virus [HIV] infection status: Secondary | ICD-10-CM

## 2024-10-28 MED ORDER — DOVATO 50-300 MG PO TABS
1.0000 | ORAL_TABLET | Freq: Every day | ORAL | 11 refills | Status: AC
  Filled 2024-10-28 (×2): qty 30, 30d supply, fill #0

## 2024-10-28 NOTE — Progress Notes (Unsigned)
 "  Name: Nicole Morgan  DOB: 10/15/1995 MRN: [id number] PCP: Baird Comer GAILS, NP     Brief Narrative:  Nicole Morgan is a 29 y.o. female with HIV diagnosis 04/29/22 from [facility] as part of contact tracing event HIV Risk: sexual History of OIs:  Intake Labs: Hep B sAg (-), sAb (-), cAb (); Hep A (), Hep C (-) Quantiferon (-)   Previous Regimens: [place]  + Descovy  04-2022 Dovato    Genotypes: Not collected.    Subjective   Subjective:   Chief Complaint  Patient presents with   Follow-up    Concerns for staph infection, taking Bactrim , using mupirocin   Reports right sided chest pain, wonders if it is related to sleeping on floor or sign of infection     Discussed the use of AI scribe software for clinical note transcription with the patient, who gave verbal consent to proceed.  History of Present Illness           Wt Readings from Last 3 Encounters:  10/28/24 105 lb (47.6 kg)  10/19/24 110 lb (49.9 kg)  08/21/24 106 lb (48.1 kg)       01/21/2024    2:52 PM  Depression screen PHQ 2/9  Decreased Interest 0  Down, Depressed, Hopeless 0  PHQ - 2 Score 0  Altered sleeping 0  Tired, decreased energy 0  Change in appetite 0  Feeling bad or failure about yourself  0  Trouble concentrating 0  Moving slowly or fidgety/restless 0  Suicidal thoughts 0  PHQ-9 Score 0      Data saved with a previous flowsheet row definition     Review of Systems  Constitutional:  Negative for chills, fever, malaise/fatigue and weight loss.  HENT:  Negative for sore throat.   Respiratory:  Negative for cough, sputum production and shortness of breath.   Cardiovascular: Negative.   Gastrointestinal:  Negative for abdominal pain, diarrhea and vomiting.  Musculoskeletal:  Negative for joint pain, myalgias and neck pain.  Skin:  Negative for rash.  Neurological:  Negative for headaches.  Psychiatric/Behavioral:  Negative for depression and substance abuse. The patient is not  nervous/anxious.      Past Medical History:  Diagnosis Date   Anxiety 2020   Bipolar 1 disorder (HCC) 2020   Cystic fibrosis carrier    Depression    no meds   Eczema    HIV (human immunodeficiency virus infection) (HCC) 04/2022   Incompetent cervix    Insomnia 2020   PTSD (post-traumatic stress disorder) 2020   Substance abuse (HCC)    alcohol, cocaine, marijuana   UTI (urinary tract infection)     Outpatient Medications Prior to Visit  Medication Sig Dispense Refill   amphetamine -dextroamphetamine  (ADDERALL) 10 MG tablet Take 1 tablet (10 mg total) by mouth daily in the afternoon for focus and attention. 30 tablet 0   mupirocin  ointment (BACTROBAN ) 2 % Apply topically 2 (two) times daily. 22 g 1   naproxen  (NAPROSYN ) 500 MG tablet Take one tablet (500 mg dose) by mouth 2 (two) times daily with meals. 25 tablet 0   sulfamethoxazole -trimethoprim  (BACTRIM  DS) 800-160 MG tablet Take one tablet by mouth 2 (two) times daily. 28 tablet 0   VYVANSE  30 MG capsule Take 1 capsule (30 mg total) by mouth every morning. 30 capsule 0   amphetamine -dextroamphetamine  (ADDERALL) 10 MG tablet Take 1 tablet (10 mg total) by mouth daily in the afternoon for focus and attention. 30 tablet 0   [START  ON 10/29/2024] amphetamine -dextroamphetamine  (ADDERALL) 10 MG tablet Take 1 tablet (10 mg total) by mouth daily in the afternoon for focus and attention. 30 tablet 0   amphetamine -dextroamphetamine  (ADDERALL) 10 MG tablet Take 1 tablet (10 mg total) by mouth daily in the afternoon for focus and attention. 30 tablet 0   celecoxib  (CELEBREX ) 200 MG capsule Take 1 capsule (200 mg total) by mouth 2 (two) times daily. 60 capsule 5   cyclobenzaprine  (FLEXERIL ) 10 MG tablet Take 1 tablet (10 mg total) by mouth 2 (two) times daily as needed for muscle spasms. 20 tablet 0   dicyclomine  (BENTYL ) 20 MG tablet Take 1 tablet (20 mg total) by mouth 2 (two) times daily. 20 tablet 0   dolutegravir -lamiVUDine  (DOVATO ) 50-300  MG tablet Take 1 tablet by mouth daily. 30 tablet 11   escitalopram  (LEXAPRO ) 20 MG tablet Take 1 tablet (20 mg total) by mouth daily. 30 tablet 5   escitalopram  (LEXAPRO ) 20 MG tablet Take 1 tablet (20 mg total) by mouth daily. 30 tablet 5   escitalopram  (LEXAPRO ) 20 MG tablet Take 1 tablet (20 mg total) by mouth daily. 30 tablet 1   escitalopram  (LEXAPRO ) 20 MG tablet Take 1 tablet (20 mg total) by mouth daily. 90 tablet 0   escitalopram  (LEXAPRO ) 20 MG tablet Take 1 tablet (20 mg total) by mouth daily. 90 tablet 0   gabapentin  (NEURONTIN ) 300 MG capsule Take 1 capsule (300 mg total) by mouth 3 (three) times daily. 21 capsule 0   gabapentin  (NEURONTIN ) 300 MG capsule Take 2 capsules (600 mg total) by mouth 3 (three) times daily. 180 capsule 3   hydrOXYzine  (ATARAX ) 25 MG tablet Take 0.5-1 tablets (12.5-25 mg total) by mouth 2 (two) times daily as needed for anxiety 60 tablet 0   hydrOXYzine  (ATARAX ) 25 MG tablet Take 0.5-1 tablets (12.5-25 mg total) by mouth 2 (two) times daily as needed for anxiety 60 tablet 0   hydrOXYzine  (ATARAX ) 25 MG tablet Take 0.5-1 tablets (12.5-25 mg total) by mouth up to  2 (two) times daily as needed for anxiety. 180 tablet 0   lidocaine  (LIDODERM ) 5 % Place 1 patch onto the skin daily. Remove & Discard patch within 12 hours or as directed by MD 15 patch 0   lidocaine  (LIDODERM ) 5 % Place 1 patch onto the skin daily. 30 patch 0   lisdexamfetamine  (VYVANSE ) 30 MG capsule Take 1 capsule (30 mg total) by mouth in the morning. 30 capsule 0   lisdexamfetamine  (VYVANSE ) 30 MG chewable tablet Chew 1 tablet (30 mg total) by mouth every morning. 30 tablet 0   loperamide  (IMODIUM ) 2 MG capsule Take 1 capsule (2 mg total) by mouth 4 (four) times daily as needed for diarrhea or loose stools. 12 capsule 0   methocarbamol  (ROBAXIN ) 500 MG tablet Take 1 tablet (500 mg total) by mouth 1 hour before bedtime 30 tablet 2   methylPREDNISolone  (MEDROL  DOSEPAK) 4 MG TBPK tablet Please  follow directions on Dosepak 21 each 0   mupirocin  ointment (BACTROBAN ) 2 % Apply topically 2 (two) times daily. 22 g 0   mupirocin  ointment (BACTROBAN ) 2 % Apply to affected area 3 times daily 22 g 5   neomycin -polymyxin b-dexamethasone  (MAXITROL ) 3.5-10000-0.1 SUSP Place 1 drop into both eyes 4 (four) times daily. 5 mL 0   nitrofurantoin , macrocrystal-monohydrate, (MACROBID ) 100 MG capsule Take 1 capsule (100 mg total) by mouth 2 (two) times daily for 5 days (Patient not taking: Reported on 10/28/2024) 10 capsule 0  QUEtiapine  (SEROQUEL ) 100 MG tablet Take 1 tablet (100 mg total) by mouth at bedtime. 90 tablet 0   QUEtiapine  (SEROQUEL ) 50 MG tablet Take 1 tablet (50 mg total) by mouth at bedtime. 30 tablet 5   QUEtiapine  (SEROQUEL ) 50 MG tablet Take 1-2 tablets (50-100 mg total) by mouth at bedtime. 60 tablet 5   valACYclovir  (VALTREX ) 1000 MG tablet Take 1 tablet (1,000 mg total) by mouth daily. 30 tablet 11   valACYclovir  (VALTREX ) 1000 MG tablet Take 1 tablet (1,000 mg total) by mouth daily. 21 tablet 0   valACYclovir  (VALTREX ) 1000 MG tablet Take 1 tablet (1,000 mg total) by mouth daily. 21 tablet 0   valACYclovir  (VALTREX ) 1000 MG tablet Take 1 tablet (1,000 mg total) by mouth daily. 21 tablet 0   VYVANSE  30 MG capsule Take 1 capsule (30 mg total) by mouth in the morning. 30 capsule 0   VYVANSE  30 MG capsule Take 1 capsule (30 mg total) by mouth every morning. Dispense as Written. 30 capsule 0   No facility-administered medications prior to visit.     No Known Allergies  Social History   Tobacco Use   Smoking status: Every Day    Types: E-cigarettes    Last attempt to quit: 04/2022    Years since quitting: 2.5    Passive exposure: Current   Smokeless tobacco: Never  Vaping Use   Vaping status: Every Day   Substances: Nicotine   Substance Use Topics   Alcohol use: Not Currently   Drug use: Not Currently    Frequency: 1.0 times per week    Types: Marijuana    Comment: last day  09/26/22-    Social History   Substance and Sexual Activity  Sexual Activity Not Currently     Objective   Objective:   Vitals:   10/28/24 0904  BP: 119/80  Pulse: (!) 116  Temp: 99.8 F (37.7 C)  TempSrc: Temporal  SpO2: 100%  Weight: 105 lb (47.6 kg)    Body mass index is 19.84 kg/m.  Physical Exam Constitutional:      Appearance: Normal appearance. She is not ill-appearing.  HENT:     Mouth/Throat:     Mouth: Mucous membranes are moist.     Pharynx: Oropharynx is clear.  Eyes:     General: No scleral icterus. Cardiovascular:     Rate and Rhythm: Normal rate and regular rhythm.  Pulmonary:     Effort: Pulmonary effort is normal.  Neurological:     Mental Status: She is oriented to person, place, and time.  Psychiatric:        Mood and Affect: Mood normal.        Thought Content: Thought content normal.     Lab Results Lab Results  Component Value Date   WBC 10.2 10/19/2024   HGB 12.1 10/19/2024   HCT 37.9 10/19/2024   MCV 102.4 (H) 10/19/2024   PLT 293 10/19/2024    Lab Results  Component Value Date   CREATININE 0.66 10/19/2024   BUN 5 (L) 10/19/2024   NA 140 10/19/2024   K 4.1 10/19/2024   CL 102 10/19/2024   CO2 31 10/19/2024    Lab Results  Component Value Date   ALT 8 10/19/2024   AST 18 10/19/2024   ALKPHOS 66 10/19/2024   BILITOT 0.2 10/19/2024    Lab Results  Component Value Date   CHOL 136 10/12/2024   HDL 49 (L) 10/12/2024   LDLCALC 68 10/12/2024  TRIG 108 10/12/2024   CHOLHDL 2.8 10/12/2024   HIV 1 RNA Quant  Date Value  10/12/2024 NOT DETECTED copies/mL  01/21/2024 NOT DETECTED copies/mL  10/09/2023 Not Detected Copies/mL   CD4 T Cell Abs (/uL)  Date Value  10/12/2024 1,309  05/19/2023 890  11/04/2022 1,148      Assessment & Plan:             No orders of the defined types were placed in this encounter.  No orders of the defined types were placed in this encounter.  No follow-ups on  file.   Corean Fireman, MSN, NP-C [facility] Pager: [phone] Office: [phone]  10/28/24  9:30 AM   "

## 2024-10-28 NOTE — Progress Notes (Signed)
 Specialty Pharmacy Refill Coordination Note  Nicole Morgan is a 29 y.o. female contacted today regarding refills of specialty medication(s) Dolutegravir -lamiVUDine  (Dovato )   Patient requested Delivery   Delivery date: 11/03/24   Verified address: [address] [place] [address]   Medication will be filled on: 11/02/24

## 2024-10-28 NOTE — Progress Notes (Signed)
 Specialty Pharmacy Ongoing Clinical Assessment Note  Nicole Morgan is a 29 y.o. female who is being followed by the specialty pharmacy service for RxSp HIV   Patient's specialty medication(s) reviewed today: Dolutegravir -lamiVUDine  (Dovato )   Missed doses in the last 4 weeks: 0   Patient/Caregiver did not have any additional questions or concerns.   Therapeutic benefit summary: Patient is achieving benefit   Adverse events/side effects summary: No adverse events/side effects   Patient's therapy is appropriate to: Continue    Goals Addressed             This Visit's Progress    Achieve Undetectable HIV Viral Load < 20   On track    Patient is on track. Patient will maintain adherence.  Her viral load remains undetectable as of 10/12/24.          Follow up: 12 months  [facility]

## 2024-10-28 NOTE — Patient Instructions (Addendum)
" °  Aquaphor for the dry open spots on your face and other skin - twice a day. I think they are too dry and have not had enough time to heal up.   Hibiclens  is the scrub that you can try head to toe 2 showers a week to help decrease skin bacteria   Message me if you start getting more of the fresh spots  "

## 2024-10-28 NOTE — Progress Notes (Signed)
 Clinical Intervention Note  Clinical Intervention Notes: Patient reported taking Bactrim  for an infection. No DDIs identified with Dovato .   Clinical Intervention Outcomes: Prevention of an adverse drug event   Advertising Account Planner

## 2024-10-29 ENCOUNTER — Other Ambulatory Visit (HOSPITAL_COMMUNITY): Payer: Self-pay

## 2025-07-12 ENCOUNTER — Ambulatory Visit: Admitting: Physician Assistant
